# Patient Record
Sex: Female | Born: 1985 | Race: White | Hispanic: No | Marital: Married | Smoking: Former smoker
Health system: Southern US, Community
[De-identification: ages and names within clinical notes are randomized; demographics above are authoritative.]

## PROBLEM LIST (undated history)

## (undated) DIAGNOSIS — F419 Anxiety disorder, unspecified: Secondary | ICD-10-CM

## (undated) DIAGNOSIS — E669 Obesity, unspecified: Secondary | ICD-10-CM

## (undated) DIAGNOSIS — J358 Other chronic diseases of tonsils and adenoids: Secondary | ICD-10-CM

## (undated) DIAGNOSIS — D239 Other benign neoplasm of skin, unspecified: Secondary | ICD-10-CM

## (undated) DIAGNOSIS — Z8489 Family history of other specified conditions: Secondary | ICD-10-CM

## (undated) DIAGNOSIS — B019 Varicella without complication: Secondary | ICD-10-CM

## (undated) DIAGNOSIS — N736 Female pelvic peritoneal adhesions (postinfective): Secondary | ICD-10-CM

## (undated) DIAGNOSIS — F32A Depression, unspecified: Secondary | ICD-10-CM

## (undated) DIAGNOSIS — B009 Herpesviral infection, unspecified: Secondary | ICD-10-CM

## (undated) DIAGNOSIS — T4145XA Adverse effect of unspecified anesthetic, initial encounter: Secondary | ICD-10-CM

## (undated) DIAGNOSIS — B029 Zoster without complications: Secondary | ICD-10-CM

## (undated) DIAGNOSIS — N83202 Unspecified ovarian cyst, left side: Secondary | ICD-10-CM

## (undated) DIAGNOSIS — T8859XA Other complications of anesthesia, initial encounter: Secondary | ICD-10-CM

## (undated) DIAGNOSIS — J020 Streptococcal pharyngitis: Secondary | ICD-10-CM

## (undated) DIAGNOSIS — R102 Pelvic and perineal pain: Secondary | ICD-10-CM

## (undated) HISTORY — DX: Streptococcal pharyngitis: J02.0

## (undated) HISTORY — DX: Varicella without complication: B01.9

## (undated) HISTORY — DX: Depression, unspecified: F32.A

## (undated) HISTORY — PX: WISDOM TOOTH EXTRACTION: SHX21

## (undated) HISTORY — DX: Other benign neoplasm of skin, unspecified: D23.9

## (undated) HISTORY — PX: ABDOMINAL HYSTERECTOMY: SHX81

## (undated) HISTORY — PX: COLONOSCOPY: SHX174

---

## 2011-05-29 HISTORY — PX: CHOLECYSTECTOMY: SHX55

## 2013-03-28 DIAGNOSIS — N83202 Unspecified ovarian cyst, left side: Secondary | ICD-10-CM

## 2013-03-28 HISTORY — DX: Unspecified ovarian cyst, left side: N83.202

## 2013-07-20 ENCOUNTER — Emergency Department
Admission: EM | Admit: 2013-07-20 | Discharge: 2013-07-20 | Disposition: A | Discharge status: Home / Self Care | Payer: BC Managed Care – PPO | Source: Home / Self Care | Attending: Family Medicine | Admitting: Family Medicine

## 2013-07-20 ENCOUNTER — Encounter: Payer: Self-pay | Admitting: Emergency Medicine

## 2013-07-20 DIAGNOSIS — H9209 Otalgia, unspecified ear: Secondary | ICD-10-CM

## 2013-07-20 DIAGNOSIS — J029 Acute pharyngitis, unspecified: Secondary | ICD-10-CM

## 2013-07-20 HISTORY — DX: Anxiety disorder, unspecified: F41.9

## 2013-07-20 LAB — POCT CBC W AUTO DIFF (K'VILLE URGENT CARE)

## 2013-07-20 LAB — POCT MONO SCREEN (KUC): Mono, POC: NEGATIVE

## 2013-07-20 LAB — POCT RAPID STREP A (OFFICE): Rapid Strep A Screen: NEGATIVE

## 2013-07-20 MED ORDER — PENICILLIN V POTASSIUM 500 MG PO TABS: ORAL_TABLET | ORAL | Status: DC

## 2013-07-20 MED ORDER — PREDNISONE 20 MG PO TABS: 20.0000 mg | ORAL_TABLET | Freq: Two times a day (BID) | ORAL | Status: DC

## 2013-07-20 NOTE — ED Notes (Signed)
Pt reports that she was seen at [facility] last wk with a positive strep test and was given Amoxicillin 875mg  BID. She reports that she is no better. Pt c/o sore throat, ear pain, stiff neck and fever.

## 2013-07-20 NOTE — ED Provider Notes (Signed)
CSN: [id number]     Arrival date & time 07/20/13  4332 History   First MD Initiated Contact with Patient 07/20/13 0915     Chief Complaint  Patient presents with  . Sore Throat  . Otalgia        HPI Comments: Patient visited an urgent care center last week with a sore throat.  Her rapid strep test was positive and she was treated with amoxicillin 875mg  BID.  She complains of persistent sore throat.  She has now developed myalgias, earache, fatigue, and low grade fever.  The history is provided by the patient.    Past Medical History  Diagnosis Date  . Anxiety    Past Surgical History  Procedure Laterality Date  . Cholecystectomy     Family History  Problem Relation Age of Onset  . Hypertension Mother    History  Substance Use Topics  . Smoking status: Former Research scientist (life sciences)  . Smokeless tobacco: Not on file  . Alcohol Use: No   OB History   Grav Para Term Preterm Abortions TAB SAB Ect Mult Living                 Review of Systems + sore throat No cough No pleuritic pain No wheezing No nasal congestion ? post-nasal drainage No sinus pain/pressure No itchy/red eyes ? earache No hemoptysis No SOB + fever, + chills No nausea No vomiting No abdominal pain No diarrhea No urinary symptoms No skin rash + fatigue + myalgias + headache Used OTC meds without relief    Allergies  Review of patient's allergies indicates no known allergies.  Home Medications   Current Outpatient Rx  Name  Route  Sig  Dispense  Refill  . amoxicillin (AMOXIL) 875 MG tablet   Oral   Take 875 mg by mouth 2 (two) times daily.         Marland Kitchen escitalopram (LEXAPRO) 10 MG tablet   Oral   Take 10 mg by mouth daily.         . medroxyPROGESTERone (DEPO-PROVERA) 150 MG/ML injection   Intramuscular   Inject 150 mg into the muscle every 3 (three) months.         . penicillin v potassium (VEETID) 500 MG tablet      Take one tab by mouth twice daily   14 tablet   0   . predniSONE  (DELTASONE) 20 MG tablet   Oral   Take 1 tablet (20 mg total) by mouth 2 (two) times daily. Take with food.   10 tablet   0    BP 100/67  Pulse 67  Temp(Src) 98 F (36.7 C) (Oral)  Resp 18  Ht 5\' 11"  (1.803 m)  Wt 191 lb (86.637 kg)  BMI 26.65 kg/m2  SpO2 100%  LMP 06/19/2013 Physical Exam Nursing notes and Vital Signs reviewed. Appearance:  Patient appears healthy, stated age, and in no acute distress Eyes:  Pupils are equal, round, and reactive to light and accomodation.  Extraocular movement is intact.  Conjunctivae are not inflamed  Ears:  Canals normal.  Tympanic membranes normal.  Nose:  Mildly congested turbinates.  No sinus tenderness.   Pharynx:  Mildly erythematous Neck:  Supple.  Tender slightly enlarged anterior nodes; large tender posterior nodes Lungs:  Clear to auscultation.  Breath sounds are equal.  Heart:  Regular rate and rhythm without murmurs, rubs, or gallops.  Abdomen: Mild tenderness over spleen without masses or hepatosplenomegaly.  Bowel sounds are present.  No  CVA or flank tenderness.  Extremities:  Normal Skin:  No rash present.   ED Course  Procedures  none    Labs Reviewed  POCT RAPID STREP A (OFFICE) negative  POCT MONO SCREEN ([facility]) negative  POCT CBC W AUTO DIFF ([facility])  WBC 7.6; LY 22.3; MO 6.6; GR 71.1; Hgb 13.1; Platelets 203         MDM   Final diagnoses:  Acute pharyngitis; ?mono despite negative rapid test    Throat culture.  Switch to penicillin EBV titers pending.  Prednisone burst. May take Tylenol for pain.  Try warm salt water gargles. Followup with Family Doctor if not improved in one week.     Kandra Nicolas, MD 07/23/13 [phone]

## 2013-07-20 NOTE — Discharge Instructions (Signed)
May take Tylenol for pain.  Try warm salt water gargles.   Salt Water Gargle This solution will help make your mouth and throat feel better. HOME CARE INSTRUCTIONS   Mix 1 teaspoon of salt in 8 ounces of warm water.  Gargle with this solution as much or often as you need or as directed. Swish and gargle gently if you have any sores or wounds in your mouth.  Do not swallow this mixture. Document Released: 02/16/2004 Document Revised: 08/06/2011 Document Reviewed: 07/09/2008 [facility] Patient Information 2014 [place].

## 2013-07-21 LAB — STREP A DNA PROBE: GASP: NEGATIVE

## 2013-07-26 ENCOUNTER — Telehealth: Payer: Self-pay

## 2013-07-26 NOTE — ED Notes (Signed)
Left a message on voice mail asking how patient is feeling and advising to call back with any questions or concerns. Also advised culture was negative.

## 2013-07-27 ENCOUNTER — Ambulatory Visit (INDEPENDENT_AMBULATORY_CARE_PROVIDER_SITE_OTHER): Payer: BC Managed Care – PPO | Admitting: Physician Assistant

## 2013-07-27 ENCOUNTER — Encounter: Payer: Self-pay | Admitting: Physician Assistant

## 2013-07-27 VITALS — BP 97/63 | HR 75 | Temp 98.3°F | Resp 16 | Ht 71.0 in | Wt 193.2 lb

## 2013-07-27 DIAGNOSIS — J020 Streptococcal pharyngitis: Secondary | ICD-10-CM

## 2013-07-27 DIAGNOSIS — F411 Generalized anxiety disorder: Secondary | ICD-10-CM

## 2013-07-27 DIAGNOSIS — Z7189 Other specified counseling: Secondary | ICD-10-CM

## 2013-07-27 DIAGNOSIS — Z7689 Persons encountering health services in other specified circumstances: Secondary | ICD-10-CM

## 2013-07-27 DIAGNOSIS — J0301 Acute recurrent streptococcal tonsillitis: Secondary | ICD-10-CM

## 2013-07-27 DIAGNOSIS — F419 Anxiety disorder, unspecified: Secondary | ICD-10-CM

## 2013-07-27 DIAGNOSIS — R87619 Unspecified abnormal cytological findings in specimens from cervix uteri: Secondary | ICD-10-CM | POA: Insufficient documentation

## 2013-07-27 HISTORY — DX: Unspecified abnormal cytological findings in specimens from cervix uteri: R87.619

## 2013-07-27 MED ORDER — AZITHROMYCIN 250 MG PO TABS: ORAL_TABLET | ORAL | Status: DC

## 2013-07-27 MED ORDER — CEFTRIAXONE SODIUM 500 MG IJ SOLR
250.0000 mg | Freq: Once | INTRAMUSCULAR | Status: AC
  Administered 2013-07-27: 250 mg via INTRAMUSCULAR

## 2013-07-27 NOTE — Assessment & Plan Note (Signed)
Endorses rapid strep positive at [facility].  Monospot and Strep Culture obtained at [facility] are negative.  Patient with tonsillar adenopathy and exudate.  Patient given IM Rocephin injection.  Rx Azithromycin.  Urgent referral placed to ENT for recurrent symptoms and to further r/o peritonsillar abscess.

## 2013-07-27 NOTE — Assessment & Plan Note (Signed)
Well-controlled with Lexapro 10 mg.  Denies SI/HI.  Denies breakthrough anxiety.  Continue current regimen.  Follow-up in 6 months.  Will obtain labs from previous PCP.

## 2013-07-27 NOTE — Progress Notes (Signed)
Patient presents to clinic today to establish care.  Acute Concerns: Persistent pharyngitis and tonsillitis -- Patient seen at [facility] outside of our system ~ 2 weeks ago for sore throat.  Patient was diagnosed with strep throat and given Rx for amoxicillin.  Patient states her symptoms worsened while on amoxicillin and therefore she went to the [facility] for evaluation and treatment.  Patient with clinical signs of strep throat.  Patient was placed of Penicillin and given a Rx for prednisone. Patient has completed course of antibiotic and steroid with persistence of symptoms.  Of note, patient has Monospot and Strep Culture performed, both of which are negative. Patient complains of continued sore throat with some mild, painful swallowing.  States her pain is now mostly left-sided.  Denied fever, chills, rash, difficulty swallowing, difficulty talking or excess drooling.  Patient is sexually active currently with 1 partner, her husband.  Does engage in fellatio.  Denies concern for STI.  Chronic Issues: Anxiety -- well-controlled with Lexapro 10 mg daily.  Denies depressed mood, anhedonia, SI/HI, panic attacks.  Does not need refill of medication at today's visit.  Health Maintenance: Dental -- UTD Vision -- UTD Immunizations -- Declines flu shot; Tetanus unknown likely over 10 years ago. PAP -- last in 2/15; + hx of abnormal pap smear; Followed by Dr. Kris Mouton; next HPV in 1 year.  Past Medical History  Diagnosis Date  . Anxiety   . Strep throat   . Chicken pox     Past Surgical History  Procedure Laterality Date  . Cholecystectomy  2013  . Wisdom tooth extraction      Current Outpatient Prescriptions on File Prior to Visit  Medication Sig Dispense Refill  . escitalopram (LEXAPRO) 10 MG tablet Take 10 mg by mouth daily.      . medroxyPROGESTERone (DEPO-PROVERA) 150 MG/ML injection Inject 150 mg into the muscle every 3 (three) months.      . penicillin v potassium  (VEETID) 500 MG tablet Take one tab by mouth twice daily  14 tablet  0   No current facility-administered medications on file prior to visit.    No Known Allergies  Family History  Problem Relation Age of Onset  . Hypertension Mother   . Arthritis Maternal Grandmother   . Hyperlipidemia Maternal Grandmother   . Hypertension Maternal Grandmother   . Throat cancer Maternal Grandfather   . Stroke Maternal Aunt   . Healthy Sister     x1  . Healthy Daughter     x1    History   Social History  . Marital Status: Single    Spouse Name: N/A    Number of Children: N/A  . Years of Education: N/A   Occupational History  . Not on file.   Social History Main Topics  . Smoking status: Former Smoker    Quit date: 07/27/2012  . Smokeless tobacco: Never Used  . Alcohol Use: No  . Drug Use: No  . Sexual Activity: Yes    Birth Control/ Protection: None     Comment: men   Other Topics Concern  . Not on file   Social History Narrative  . No narrative on file    Review of Systems  Constitutional: Negative for fever and weight loss.  HENT: Positive for sore throat. Negative for ear pain, hearing loss and tinnitus.   Eyes: Negative for blurred vision and double vision.  Respiratory: Negative for cough, shortness of breath and wheezing.   Cardiovascular: Negative for  chest pain and palpitations.  Gastrointestinal: Positive for constipation. Negative for heartburn, nausea, vomiting, abdominal pain, diarrhea, blood in stool and melena.  Genitourinary: Negative for dysuria, urgency, frequency, hematuria and flank pain.  Neurological: Negative for dizziness, seizures, loss of consciousness and headaches.  Endo/Heme/Allergies: Negative for environmental allergies.  Psychiatric/Behavioral: Negative for depression, suicidal ideas, hallucinations and substance abuse. The patient is nervous/anxious. The patient does not have insomnia.    BP 97/63  Pulse 75  Temp(Src) 98.3 F (36.8 C)  (Oral)  Resp 16  Ht 5\' 11"  (1.803 m)  Wt 193 lb 4 oz (87.658 kg)  BMI 26.96 kg/m2  SpO2 99%  LMP 06/19/2013  Physical Exam  Nursing note and vitals reviewed. Constitutional: She is oriented to person, place, and time and well-developed, well-nourished, and in no distress.  HENT:  Head: Normocephalic and atraumatic.  Right Ear: External ear normal.  Left Ear: External ear normal.  Mouth/Throat: Uvula is midline and mucous membranes are normal. No uvula swelling. Oropharyngeal exudate present. No posterior oropharyngeal edema or posterior oropharyngeal erythema.  No overt evidence of peritonsillar abscess.  L tonsil 2+ with white exudate. R tonsil 2+ without exudate.  Eyes: Conjunctivae are normal. Pupils are equal, round, and reactive to light.  Neck: Neck supple. No tracheal deviation present.  Cardiovascular: Normal rate, regular rhythm and normal heart sounds.   Pulmonary/Chest: Effort normal and breath sounds normal. No stridor. No respiratory distress. She has no wheezes. She has no rales. She exhibits no tenderness.  Lymphadenopathy:    She has no cervical adenopathy.  Neurological: She is alert and oriented to person, place, and time.  Skin: Skin is warm and dry. No rash noted.  Psychiatric: Affect normal.   Recent Results (from the past 2160 hour(s))  POCT RAPID STREP A (OFFICE)     Status: None   Collection Time    07/20/13  9:14 AM      Result Value Ref Range   Rapid Strep A Screen Negative  Negative  POCT MONO SCREEN ([facility])     Status: None   Collection Time    07/20/13  9:14 AM      Result Value Ref Range   Mono, POC Negative  Negative  POCT CBC W AUTO DIFF ([facility])     Status: None   Collection Time    07/20/13  9:14 AM      Result Value Ref Range   WBC    4.5 - 10.5 K/uL   Comment: see scanned report   Lymphocytes relative %    15 - 45 %   Monocytes relative %    2 - 10 %   Neutrophils relative % (GR)    44 - 76 %   Lymphocytes absolute    0.1 -  1.8 K/uL   Monocyes absolute    0.1 - 1 K/uL   Neutrophils absolute (GR#)    1.7 - 7.8 K/uL   RBC    3.8 - 5.1 MIL/uL   Hemoglobin    11.8 - 15.5 g/dL   Hematocrit    34.8 - 46 %   MCV    78 - 100 fL   MCH    26 - 32 pg   MCHC    32 - 36.5 g/dL   RDW    11.6 - 14 %   Platelet count    140 - 400 K/uL   MPV    7.8 - 11 fL  STREP A DNA  PROBE     Status: None   Collection Time    07/20/13  9:25 AM      Result Value Ref Range   GASP NEGATIVE     Assessment/Plan: Anxiety Well-controlled with Lexapro 10 mg.  Denies SI/HI.  Denies breakthrough anxiety.  Continue current regimen.  Follow-up in 6 months.  Will obtain labs from previous PCP.  Encounter to establish care Medical history reviewed.  Patient UTD on health maintenance.  Patient endorses having physical with her PCP in [place].  Will obtain records.  Patient declines flu shot. Is overdue for tetanus but declines vaccination at today's visit.  Patient followed by Dr. Kris Mouton (OB/GYN) for gynecological care.  Endorses last pap was in 2014; was abnormal.  Has repeat PAP in 1 year.  Recurrent streptococcal tonsillitis Endorses rapid strep positive at [facility].  Monospot and Strep Culture obtained at [facility] are negative.  Patient with tonsillar adenopathy and exudate.  Patient given IM Rocephin injection.  Rx Azithromycin.  Urgent referral placed to ENT for recurrent symptoms and to further r/o peritonsillar abscess.

## 2013-07-27 NOTE — Patient Instructions (Signed)
Please stop Penicillin.  You will be contacted by ENT office for an appointment.  If appointment is in the next 1-2 days, hold off on taking Azithromycin as the ENT may want to change your medication regimen.  However if it is more than 2 days before appointment, please start taking the Azithromycin as directed.  Alternate tylenol and ibuprofen for pain.  Salt-water gargles. If you develop difficulty swallowing, speaking or cannot control your saliva, please proceed directly to the ER.  Please follow-up in 6 months for anxiety.  I will obtain your records from your previous PCP.  Follow-up with Gynecology for PAP smears and routine gynecological care.  Return to clinic sooner if you need anything.

## 2013-07-27 NOTE — Progress Notes (Signed)
Pre visit review using our clinic review tool, if applicable. No additional management support is needed unless otherwise documented below in the visit note/SLS  

## 2013-07-27 NOTE — Assessment & Plan Note (Signed)
Medical history reviewed.  Patient UTD on health maintenance.  Patient endorses having physical with her PCP in [place].  Will obtain records.  Patient declines flu shot. Is overdue for tetanus but declines vaccination at today's visit.  Patient followed by Dr. Kris Mouton (OB/GYN) for gynecological care.  Endorses last pap was in 2014; was abnormal.  Has repeat PAP in 1 year.

## 2014-01-07 ENCOUNTER — Encounter: Payer: Self-pay | Admitting: Physician Assistant

## 2014-01-07 ENCOUNTER — Ambulatory Visit (INDEPENDENT_AMBULATORY_CARE_PROVIDER_SITE_OTHER): Payer: BC Managed Care – PPO | Admitting: Physician Assistant

## 2014-01-07 VITALS — BP 108/74 | HR 61 | Temp 98.4°F | Resp 16 | Ht 71.0 in | Wt 201.5 lb

## 2014-01-07 DIAGNOSIS — M5431 Sciatica, right side: Secondary | ICD-10-CM

## 2014-01-07 DIAGNOSIS — K5900 Constipation, unspecified: Secondary | ICD-10-CM | POA: Insufficient documentation

## 2014-01-07 DIAGNOSIS — M5430 Sciatica, unspecified side: Secondary | ICD-10-CM | POA: Insufficient documentation

## 2014-01-07 MED ORDER — METHYLPREDNISOLONE (PAK) 4 MG PO TABS: ORAL_TABLET | ORAL | Status: DC

## 2014-01-07 MED ORDER — TRAMADOL HCL 50 MG PO TABS
50.0000 mg | ORAL_TABLET | Freq: Three times a day (TID) | ORAL | Status: DC | PRN
Start: 2014-01-07 — End: 2014-04-21

## 2014-01-07 NOTE — Progress Notes (Signed)
Pre visit review using our clinic review tool, if applicable. No additional management support is needed unless otherwise documented below in the visit note/SLS  

## 2014-01-07 NOTE — Assessment & Plan Note (Signed)
Rx medrol dose pack and Tramadol.  Topical Aspercreme.  Avoid overexertion. Heating pad.  Return precautions discussed.

## 2014-01-07 NOTE — Assessment & Plan Note (Signed)
Increase fluid intake.  Probiotic and fiber supplement.  Milk of Magnesia or warm prune juice if no bowel movement in 2 days. Discussed Rx therapy but patient declines at present.

## 2014-01-07 NOTE — Progress Notes (Signed)
Patient presents to clinic today c/o right sided hip and LBP radiating down her RLE.  Patient denies trauma or injury.  States that she has had some hip trouble since having her daughter several years ago.  Denies weakness or RLE, but does endorse some numbness at lateral aspect of R hip.  Denies saddle anesthesia but denies urinary/fecal incontinence.   Patient does wish to talk about her chronic constipation.  Endorses 1 bowel movement about every 3-4 days.  Does not take a fiber supplement.  Has tried Miralax before with little relief of symptoms. Denies hard stool with bowel movement.  Denies tenesmus, melena or hematochezia.  Past Medical History  Diagnosis Date  . Anxiety   . Strep throat   . Chicken pox     Current Outpatient Prescriptions on File Prior to Visit  Medication Sig Dispense Refill  . Calcium Carb-Cholecalciferol (CALCIUM-VITAMIN D) 600-400 MG-UNIT TABS Take 1 each by mouth daily.      Marland Kitchen escitalopram (LEXAPRO) 10 MG tablet Take 10 mg by mouth daily.      Marland Kitchen ibuprofen (ADVIL,MOTRIN) 200 MG tablet Take 200 mg by mouth every 6 (six) hours as needed.      . medroxyPROGESTERone (DEPO-PROVERA) 150 MG/ML injection Inject 150 mg into the muscle every 3 (three) months.      . Multiple Vitamin (MULTIVITAMIN) tablet Take 1 tablet by mouth daily.       No current facility-administered medications on file prior to visit.    No Known Allergies  Family History  Problem Relation Age of Onset  . Hypertension Mother   . Arthritis Maternal Grandmother   . Hyperlipidemia Maternal Grandmother   . Hypertension Maternal Grandmother   . Throat cancer Maternal Grandfather   . Stroke Maternal Aunt   . Healthy Sister     x1  . Healthy Daughter     x1    History   Social History  . Marital Status: Single    Spouse Name: N/A    Number of Children: N/A  . Years of Education: N/A   Social History Main Topics  . Smoking status: Former Smoker    Quit date: 07/27/2012  . Smokeless  tobacco: Never Used  . Alcohol Use: No  . Drug Use: No  . Sexual Activity: Yes    Birth Control/ Protection: None     Comment: men   Other Topics Concern  . None   Social History Narrative  . None   Review of Systems - See HPI.  All other ROS are negative.  BP 108/74  Pulse 61  Temp(Src) 98.4 F (36.9 C) (Oral)  Resp 16  Ht 5\' 11"  (1.803 m)  Wt 201 lb 8 oz (91.4 kg)  BMI 28.12 kg/m2  SpO2 99%  Physical Exam  Vitals reviewed. Constitutional: She is oriented to person, place, and time.  Cardiovascular: Normal rate, regular rhythm, normal heart sounds and intact distal pulses.   Pulmonary/Chest: Effort normal and breath sounds normal. No respiratory distress. She has no wheezes. She has no rales. She exhibits no tenderness.  Abdominal: Soft. Bowel sounds are normal. She exhibits no distension. There is no tenderness.  Musculoskeletal:       Right hip: She exhibits decreased strength. She exhibits normal range of motion, no tenderness and no bony tenderness.       Lumbar back: She exhibits no tenderness and no bony tenderness.  Neurological: She is alert and oriented to person, place, and time.  Skin: Skin is warm  and dry. No erythema.   Assessment/Plan: Sciatica Rx medrol dose pack and Tramadol.  Topical Aspercreme.  Avoid overexertion. Heating pad.  Return precautions discussed.  Unspecified constipation Increase fluid intake.  Probiotic and fiber supplement.  Milk of Magnesia or warm prune juice if no bowel movement in 2 days. Discussed Rx therapy but patient declines at present.

## 2014-01-07 NOTE — Patient Instructions (Signed)
Please take medications as directed.  Avoid heavy lifting or overexertion.  Apply topical Aspercreme to area each day.  Rest. Call or return to clinic if symptoms are not improving.  For constipation -- increase fluid intake.  Take a fiber supplement and a daily probiotic (Activia, Align. Culturelle).  If no bowel movement in 2 days, take a dose of Milk of Magnesia or a glass of warm prune juice. Please let me know if symptoms are not improving.  Constipation Constipation is when a person has fewer than three bowel movements a week, has difficulty having a bowel movement, or has stools that are dry, hard, or larger than normal. As people grow older, constipation is more common. If you try to fix constipation with medicines that make you have a bowel movement (laxatives), the problem may get worse. Long-term laxative use may cause the muscles of the colon to become weak. A low-fiber diet, not taking in enough fluids, and taking certain medicines may make constipation worse.  CAUSES   Certain medicines, such as antidepressants, pain medicine, iron supplements, antacids, and water pills.   Certain diseases, such as diabetes, irritable bowel syndrome (IBS), thyroid disease, or depression.   Not drinking enough water.   Not eating enough fiber-rich foods.   Stress or travel.   Lack of physical activity or exercise.   Ignoring the urge to have a bowel movement.   Using laxatives too much.  SIGNS AND SYMPTOMS   Having fewer than three bowel movements a week.   Straining to have a bowel movement.   Having stools that are hard, dry, or larger than normal.   Feeling full or bloated.   Pain in the lower abdomen.   Not feeling relief after having a bowel movement.  DIAGNOSIS  Your health care provider will take a medical history and perform a physical exam. Further testing may be done for severe constipation. Some tests may include:  A barium enema X-ray to examine your  rectum, colon, and, sometimes, your small intestine.   A sigmoidoscopy to examine your lower colon.   A colonoscopy to examine your entire colon. TREATMENT  Treatment will depend on the severity of your constipation and what is causing it. Some dietary treatments include drinking more fluids and eating more fiber-rich foods. Lifestyle treatments may include regular exercise. If these diet and lifestyle recommendations do not help, your health care provider may recommend taking over-the-counter laxative medicines to help you have bowel movements. Prescription medicines may be prescribed if over-the-counter medicines do not work.  HOME CARE INSTRUCTIONS   Eat foods that have a lot of fiber, such as fruits, vegetables, whole grains, and beans.  Limit foods high in fat and processed sugars, such as french fries, hamburgers, cookies, candies, and soda.   A fiber supplement may be added to your diet if you cannot get enough fiber from foods.   Drink enough fluids to keep your urine clear or pale yellow.   Exercise regularly or as directed by your health care provider.   Go to the restroom when you have the urge to go. Do not hold it.   Only take over-the-counter or prescription medicines as directed by your health care provider. Do not take other medicines for constipation without talking to your health care provider first.  [place] IF:   You have bright red blood in your stool.   Your constipation lasts for more than 4 days or gets worse.   You have abdominal  or rectal pain.   You have thin, pencil-like stools.   You have unexplained weight loss. MAKE SURE YOU:   Understand these instructions.  Will watch your condition.  Will get help right away if you are not doing well or get worse. Document Released: 02/10/2004 Document Revised: 05/19/2013 Document Reviewed: 02/23/2013 [facility] Patient Information 2015 [place], [place]. This information is not  intended to replace advice given to you by your health care provider. Make sure you discuss any questions you have with your health care provider.

## 2014-02-26 ENCOUNTER — Ambulatory Visit: Payer: BC Managed Care – PPO | Admitting: Family

## 2014-04-14 ENCOUNTER — Telehealth: Payer: Self-pay | Admitting: *Deleted

## 2014-04-14 ENCOUNTER — Ambulatory Visit: Payer: BC Managed Care – PPO | Admitting: Physician Assistant

## 2014-04-14 NOTE — Telephone Encounter (Signed)
Pt did not show for appointment 04/14/2014 at 5:30pm for med refill

## 2014-04-21 ENCOUNTER — Ambulatory Visit (INDEPENDENT_AMBULATORY_CARE_PROVIDER_SITE_OTHER): Payer: BC Managed Care – PPO | Admitting: Physician Assistant

## 2014-04-21 ENCOUNTER — Encounter: Payer: Self-pay | Admitting: Physician Assistant

## 2014-04-21 VITALS — BP 107/52 | HR 70 | Temp 98.5°F | Resp 16 | Ht 71.0 in | Wt 205.0 lb

## 2014-04-21 DIAGNOSIS — F419 Anxiety disorder, unspecified: Secondary | ICD-10-CM

## 2014-04-21 MED ORDER — ESCITALOPRAM OXALATE 10 MG PO TABS: 10.0000 mg | ORAL_TABLET | Freq: Every day | ORAL | Status: DC

## 2014-04-21 NOTE — Assessment & Plan Note (Signed)
Doing well.  Will refill Lexapro 10 mg to continue taking daily.  Follow-up in 6 months.

## 2014-04-21 NOTE — Progress Notes (Signed)
   Patient presents to clinic today for medication management. Patient on Lexapro for generalized anxiety disorder.  Prescribed by psychiatry previously. Patient's symptoms have been well controlled on this medication for several years.  Denies breakthrough anxiety or panic attack as long as she is consistent with use of medication.  Denies SI/HI.  Past Medical History  Diagnosis Date  . Anxiety   . Strep throat   . Chicken pox     Current Outpatient Prescriptions on File Prior to Visit  Medication Sig Dispense Refill  . Calcium Carb-Cholecalciferol (CALCIUM-VITAMIN D) 600-400 MG-UNIT TABS Take 1 each by mouth daily.    Marland Kitchen ibuprofen (ADVIL,MOTRIN) 200 MG tablet Take 200 mg by mouth every 6 (six) hours as needed.    . medroxyPROGESTERone (DEPO-PROVERA) 150 MG/ML injection Inject 150 mg into the muscle every 3 (three) months.    . Multiple Vitamin (MULTIVITAMIN) tablet Take 1 tablet by mouth daily.     No current facility-administered medications on file prior to visit.    No Known Allergies  Family History  Problem Relation Age of Onset  . Hypertension Mother   . Arthritis Maternal Grandmother   . Hyperlipidemia Maternal Grandmother   . Hypertension Maternal Grandmother   . Throat cancer Maternal Grandfather   . Stroke Maternal Aunt   . Healthy Sister     x1  . Healthy Daughter     x1    History   Social History  . Marital Status: Single    Spouse Name: N/A    Number of Children: N/A  . Years of Education: N/A   Social History Main Topics  . Smoking status: Former Smoker    Quit date: 07/27/2012  . Smokeless tobacco: Never Used  . Alcohol Use: No  . Drug Use: No  . Sexual Activity: Yes    Birth Control/ Protection: None     Comment: men   Other Topics Concern  . None   Social History Narrative   Review of Systems - See HPI.  All other ROS are negative.  BP 107/52 mmHg  Pulse 70  Temp(Src) 98.5 F (36.9 C) (Oral)  Resp 16  Ht 5\' 11"  (1.803 m)  Wt 205 lb  (92.987 kg)  BMI 28.60 kg/m2  SpO2 100%  Physical Exam  Constitutional: She is oriented to person, place, and time and well-developed, well-nourished, and in no distress.  HENT:  Head: Normocephalic and atraumatic.  Cardiovascular: Normal rate, regular rhythm, normal heart sounds and intact distal pulses.   Pulmonary/Chest: Effort normal and breath sounds normal. No respiratory distress. She has no wheezes. She has no rales. She exhibits no tenderness.  Neurological: She is alert and oriented to person, place, and time.  Skin: Skin is warm and dry. No rash noted.  Psychiatric: Affect normal.  Vitals reviewed.  Assessment/Plan: Anxiety Doing well.  Will refill Lexapro 10 mg to continue taking daily.  Follow-up in 6 months.

## 2014-04-21 NOTE — Patient Instructions (Signed)
Please take Lexapro as directed.  Follow-up with me in 6 months.  Return sooner if needed.  I am glad you are doing well.  Have a happy Thanksgiving!  Generalized Anxiety Disorder Generalized anxiety disorder (GAD) is a mental disorder. It interferes with life functions, including relationships, work, and school. GAD is different from normal anxiety, which everyone experiences at some point in their lives in response to specific life events and activities. Normal anxiety actually helps [place] prepare for and get through these life events and activities. Normal anxiety goes away after the event or activity is over.  GAD causes anxiety that is not necessarily related to specific events or activities. It also causes excess anxiety in proportion to specific events or activities. The anxiety associated with GAD is also difficult to control. GAD can vary from mild to severe. People with severe GAD can have intense waves of anxiety with physical symptoms (panic attacks).  SYMPTOMS The anxiety and worry associated with GAD are difficult to control. This anxiety and worry are related to many life events and activities and also occur more days than not for 6 months or longer. People with GAD also have three or more of the following symptoms (one or more in children):  Restlessness.   Fatigue.  Difficulty concentrating.   Irritability.  Muscle tension.  Difficulty sleeping or unsatisfying sleep. DIAGNOSIS GAD is diagnosed through an assessment by your health care provider. Your health care provider will ask you questions aboutyour mood,physical symptoms, and events in your life. Your health care provider may ask you about your medical history and use of alcohol or drugs, including prescription medicines. Your health care provider may also do a physical exam and blood tests. Certain medical conditions and the use of certain substances can cause symptoms similar to those associated with GAD. Your health care  provider may refer you to a mental health specialist for further evaluation. TREATMENT The following therapies are usually used to treat GAD:   Medication. Antidepressant medication usually is prescribed for long-term daily control. Antianxiety medicines may be added in severe cases, especially when panic attacks occur.   Talk therapy (psychotherapy). Certain types of talk therapy can be helpful in treating GAD by providing support, education, and guidance. A form of talk therapy called cognitive behavioral therapy can teach you healthy ways to think about and react to daily life events and activities.  Stress managementtechniques. These include yoga, meditation, and exercise and can be very helpful when they are practiced regularly. A mental health specialist can help determine which treatment is best for you. Some people see improvement with one therapy. However, other people require a combination of therapies. Document Released: 09/08/2012 Document Revised: 09/28/2013 Document Reviewed: 09/08/2012 [facility] Patient Information 2015 [place], [place]. This information is not intended to replace advice given to you by your health care provider. Make sure you discuss any questions you have with your health care provider.

## 2014-05-04 ENCOUNTER — Ambulatory Visit (INDEPENDENT_AMBULATORY_CARE_PROVIDER_SITE_OTHER): Payer: BC Managed Care – PPO | Admitting: Family Medicine

## 2014-05-04 ENCOUNTER — Encounter: Payer: Self-pay | Admitting: Family Medicine

## 2014-05-04 VITALS — BP 120/81 | HR 80 | Temp 98.9°F | Wt 208.0 lb

## 2014-05-04 DIAGNOSIS — T148 Other injury of unspecified body region: Secondary | ICD-10-CM

## 2014-05-04 DIAGNOSIS — W57XXXA Bitten or stung by nonvenomous insect and other nonvenomous arthropods, initial encounter: Secondary | ICD-10-CM

## 2014-05-04 MED ORDER — DOXYCYCLINE HYCLATE 100 MG PO TABS
100.0000 mg | ORAL_TABLET | Freq: Two times a day (BID) | ORAL | Status: DC
Start: 2014-05-04 — End: 2014-05-12

## 2014-05-04 NOTE — Progress Notes (Signed)
Pre visit review using our clinic review tool, if applicable. No additional management support is needed unless otherwise documented below in the visit note. 

## 2014-05-04 NOTE — Patient Instructions (Signed)

## 2014-05-04 NOTE — Progress Notes (Signed)
   Subjective:    Patient ID: Olivia Reyes, female    DOB: Jun 02, 1985, 28 y.o.   MRN: [id number]  HPI Pt here c/o possible insect bite on L buttock--painful to touch only.  No fevers, arthralgias, chills, no nvd,   pt noticed it 2 days ago and it inc in size and became painful.    Review of Systems As above    Objective:   Physical Exam  BP 120/81 mmHg  Pulse 80  Temp(Src) 98.9 F (37.2 C) (Oral)  Wt 208 lb (94.348 kg)  SpO2 100% General appearance: alert, cooperative, appears stated age and no distress Skin: papular - buttock(s) left          + errythema and draining scant amount clear fluid      Assessment & Plan:  1. Insect bite Culture done Call in 3-4 days if no better - doxycycline (VIBRA-TABS) 100 MG tablet; Take 1 tablet (100 mg total) by mouth 2 (two) times daily.  Dispense: 20 tablet; Refill: 0 - Wound culture

## 2014-05-08 LAB — WOUND CULTURE
GRAM STAIN: NONE SEEN
Gram Stain: NONE SEEN
Organism ID, Bacteria: NO GROWTH

## 2014-05-12 ENCOUNTER — Encounter: Payer: Self-pay | Admitting: Physician Assistant

## 2014-05-12 ENCOUNTER — Ambulatory Visit (INDEPENDENT_AMBULATORY_CARE_PROVIDER_SITE_OTHER): Payer: BC Managed Care – PPO | Admitting: Physician Assistant

## 2014-05-12 VITALS — BP 112/56 | HR 64 | Temp 98.7°F | Resp 16 | Ht 71.0 in | Wt 211.4 lb

## 2014-05-12 DIAGNOSIS — B029 Zoster without complications: Secondary | ICD-10-CM | POA: Insufficient documentation

## 2014-05-12 MED ORDER — TRAMADOL HCL 50 MG PO TABS: 50.0000 mg | ORAL_TABLET | Freq: Three times a day (TID) | ORAL | Status: DC | PRN

## 2014-05-12 MED ORDER — LIDOCAINE 5 % EX OINT: 1.0000 "application " | TOPICAL_OINTMENT | CUTANEOUS | Status: DC | PRN

## 2014-05-12 MED ORDER — VALACYCLOVIR HCL 1 G PO TABS
1000.0000 mg | ORAL_TABLET | Freq: Three times a day (TID) | ORAL | Status: DC
Start: 2014-05-12 — End: 2015-04-20

## 2014-05-12 NOTE — Progress Notes (Signed)
Pre visit review using our clinic review tool, if applicable. No additional management support is needed unless otherwise documented below in the visit note/SLS  

## 2014-05-12 NOTE — Assessment & Plan Note (Signed)
Rx Valtrex.  Rx Lidocaine ointment and Tramadol for severe pain.  Course of illness and contagious nature discussed with patient.  Follow-up discussed with patient.

## 2014-05-12 NOTE — Progress Notes (Signed)
Patient presents to clinic today c/o blistering rash of left hip that started out as a solitary lesion and just began to spread and blister over the past 2 days.  Endorses history of chicken pox, but denies history of shingles.  States rash is only on one side.  Was treated 1 week ago for potential insect bite with doxycycline.  Culture obtained and negative for any bacteria.   Past Medical History  Diagnosis Date  . Anxiety   . Strep throat   . Chicken pox     Current Outpatient Prescriptions on File Prior to Visit  Medication Sig Dispense Refill  . Calcium Carb-Cholecalciferol (CALCIUM-VITAMIN D) 600-400 MG-UNIT TABS Take 1 each by mouth daily.    Marland Kitchen escitalopram (LEXAPRO) 10 MG tablet Take 1 tablet (10 mg total) by mouth daily. 90 tablet 1  . ibuprofen (ADVIL,MOTRIN) 200 MG tablet Take 200 mg by mouth every 6 (six) hours as needed.    . medroxyPROGESTERone (DEPO-PROVERA) 150 MG/ML injection Inject 150 mg into the muscle every 3 (three) months.    . Multiple Vitamin (MULTIVITAMIN) tablet Take 1 tablet by mouth daily.     No current facility-administered medications on file prior to visit.    No Known Allergies  Family History  Problem Relation Age of Onset  . Hypertension Mother   . Arthritis Maternal Grandmother   . Hyperlipidemia Maternal Grandmother   . Hypertension Maternal Grandmother   . Throat cancer Maternal Grandfather   . Stroke Maternal Aunt   . Healthy Sister     x1  . Healthy Daughter     x1    History   Social History  . Marital Status: Single    Spouse Name: N/A    Number of Children: N/A  . Years of Education: N/A   Social History Main Topics  . Smoking status: Former Smoker    Quit date: 07/27/2012  . Smokeless tobacco: Never Used  . Alcohol Use: No  . Drug Use: No  . Sexual Activity: Yes    Birth Control/ Protection: None     Comment: men   Other Topics Concern  . None   Social History Narrative   Review of Systems. See HPI.  All other  ROS are negative.  BP 112/56 mmHg  Pulse 64  Temp(Src) 98.7 F (37.1 C) (Oral)  Resp 16  Ht 5\' 11"  (1.803 m)  Wt 211 lb 6 oz (95.879 kg)  BMI 29.49 kg/m2  SpO2 100%  Physical Exam  Constitutional: She is oriented to person, place, and time and well-developed, well-nourished, and in no distress.  HENT:  Head: Normocephalic and atraumatic.  Eyes: Conjunctivae are normal.  Neck: Neck supple.  Cardiovascular: Normal rate, regular rhythm, normal heart sounds and intact distal pulses.   Pulmonary/Chest: Effort normal and breath sounds normal. No respiratory distress. She has no wheezes. She has no rales. She exhibits no tenderness.  Neurological: She is alert and oriented to person, place, and time.  Skin: Skin is warm and dry.     Vitals reviewed.   Recent Results (from the past 2160 hour(s))  Wound culture     Status: None   Collection Time: 05/04/14  6:33 PM  Result Value Ref Range   Gram Stain Rare    Gram Stain WBC present-predominately PMN    Gram Stain No Squamous Epithelial Cells Seen    Gram Stain No Organisms Seen    Organism ID, Bacteria NO GROWTH 2 DAYS  Assessment/Plan: Shingles rash Rx Valtrex.  Rx Lidocaine ointment and Tramadol for severe pain.  Course of illness and contagious nature discussed with patient.  Follow-up discussed with patient.

## 2014-05-12 NOTE — Patient Instructions (Signed)
Please take Valtrex as directed.  For pain use the Tramadol and Lidocaine ointment given to you. Please keep rash covered as it is contagious while there are blisters.  Avoid contact with pregnant women.  Call or return to clinic if symptoms are not improving.  Shingles Shingles (herpes zoster) is an infection that is caused by the same virus that causes chickenpox (varicella). The infection causes a painful skin rash and fluid-filled blisters, which eventually break open, crust over, and heal. It may occur in any area of the body, but it usually affects only one side of the body or face. The pain of shingles usually lasts about 1 month. However, some people with shingles may develop long-term (chronic) pain in the affected area of the body. Shingles often occurs many years after the person had chickenpox. It is more common:  In people older than 50 years.  In people with weakened immune systems, such as those with HIV, AIDS, or cancer.  In people taking medicines that weaken the immune system, such as transplant medicines.  In people under great stress. CAUSES  Shingles is caused by the varicella zoster virus (VZV), which also causes chickenpox. After a person is infected with the virus, it can remain in the person's body for years in an inactive state (dormant). To cause shingles, the virus reactivates and breaks out as an infection in a nerve root. The virus can be spread from person to person (contagious) through contact with open blisters of the shingles rash. It will only spread to people who have not had chickenpox. When these people are exposed to the virus, they may develop chickenpox. They will not develop shingles. Once the blisters scab over, the person is no longer contagious and cannot spread the virus to others. SIGNS AND SYMPTOMS  Shingles shows up in stages. The initial symptoms may be pain, itching, and tingling in an area of the skin. This pain is usually described as burning,  stabbing, or throbbing.In a few days or weeks, a painful red rash will appear in the area where the pain, itching, and tingling were felt. The rash is usually on one side of the body in a band or belt-like pattern. Then, the rash usually turns into fluid-filled blisters. They will scab over and dry up in approximately 2-3 weeks. Flu-like symptoms may also occur with the initial symptoms, the rash, or the blisters. These may include:  Fever.  Chills.  Headache.  Upset stomach. DIAGNOSIS  Your health care provider will perform a skin exam to diagnose shingles. Skin scrapings or fluid samples may also be taken from the blisters. This sample will be examined under a microscope or sent to a lab for further testing. TREATMENT  There is no specific cure for shingles. Your health care provider will likely prescribe medicines to help you manage the pain, recover faster, and avoid long-term problems. This may include antiviral drugs, anti-inflammatory drugs, and pain medicines. HOME CARE INSTRUCTIONS   Take a cool bath or apply cool compresses to the area of the rash or blisters as directed. This may help with the pain and itching.   Take medicines only as directed by your health care provider.   Rest as directed by your health care provider.  Keep your rash and blisters clean with mild soap and cool water or as directed by your health care provider.  Do not pick your blisters or scratch your rash. Apply an anti-itch cream or numbing creams to the affected area as directed  by your health care provider.  Keep your shingles rash covered with a loose bandage (dressing).  Avoid skin contact with:  Babies.   Pregnant women.   Children with eczema.   Elderly people with transplants.   People with chronic illnesses, such as leukemia or AIDS.   Wear loose-fitting clothing to help ease the pain of material rubbing against the rash.  Keep all follow-up visits as directed by your health  care provider.If the area involved is on your face, you may receive a referral for a specialist, such as an eye doctor (ophthalmologist) or an ear, nose, and throat (ENT) doctor. Keeping all follow-up visits will help you avoid eye problems, chronic pain, or disability.  SEEK IMMEDIATE MEDICAL CARE IF:   You have facial pain, pain around the eye area, or loss of feeling on one side of your face.  You have ear pain or ringing in your ear.  You have loss of taste.  Your pain is not relieved with prescribed medicines.   Your redness or swelling spreads.   You have more pain and swelling.  Your condition is worsening or has changed.   You have a fever. MAKE SURE YOU:  Understand these instructions.  Will watch your condition.  Will get help right away if you are not doing well or get worse. Document Released: 05/14/2005 Document Revised: 09/28/2013 Document Reviewed: 12/27/2011 [facility] Patient Information 2015 [place], [place]. This information is not intended to replace advice given to you by your health care provider. Make sure you discuss any questions you have with your health care provider.

## 2014-07-23 LAB — HM PAP SMEAR: HM Pap smear: NORMAL

## 2015-04-19 ENCOUNTER — Ambulatory Visit: Payer: Self-pay | Admitting: Physician Assistant

## 2015-04-20 ENCOUNTER — Encounter: Payer: Self-pay | Admitting: Physician Assistant

## 2015-04-20 ENCOUNTER — Telehealth: Payer: Self-pay | Admitting: Physician Assistant

## 2015-04-20 ENCOUNTER — Ambulatory Visit (INDEPENDENT_AMBULATORY_CARE_PROVIDER_SITE_OTHER): Payer: BLUE CROSS/BLUE SHIELD | Admitting: Physician Assistant

## 2015-04-20 VITALS — BP 108/54 | HR 79 | Temp 98.0°F | Resp 16 | Ht 71.0 in | Wt 221.1 lb

## 2015-04-20 DIAGNOSIS — F419 Anxiety disorder, unspecified: Secondary | ICD-10-CM | POA: Diagnosis not present

## 2015-04-20 MED ORDER — ALPRAZOLAM 0.5 MG PO TBDP: 0.5000 mg | ORAL_TABLET | Freq: Two times a day (BID) | ORAL | Status: DC | PRN

## 2015-04-20 MED ORDER — ESCITALOPRAM OXALATE 20 MG PO TABS: 20.0000 mg | ORAL_TABLET | Freq: Every day | ORAL | Status: DC

## 2015-04-20 NOTE — Progress Notes (Signed)
   Patient presents to clinic today c/o sub-therapeutic medication for anxiety/depression. Endorses taking Lexapro daily as directed. (Last prescribed 1 year ago -6 month supply only). Endorses medication was doing well but over the past 2 months, due to recent stressors, the medication is not working as well. Denies SI/HI. Is sleeping well. Is feeling agitated and endorses anhedonia and depressed most days of the week.  Past Medical History  Diagnosis Date  . Anxiety   . Strep throat   . Chicken pox     Current Outpatient Prescriptions on File Prior to Visit  Medication Sig Dispense Refill  . Calcium Carb-Cholecalciferol (CALCIUM-VITAMIN D) 600-400 MG-UNIT TABS Take 1 each by mouth daily.    Marland Kitchen ibuprofen (ADVIL,MOTRIN) 200 MG tablet Take 200 mg by mouth every 6 (six) hours as needed.    . lidocaine (XYLOCAINE) 5 % ointment Apply 1 application topically as needed. 35.44 g 0  . medroxyPROGESTERone (DEPO-PROVERA) 150 MG/ML injection Inject 150 mg into the muscle every 3 (three) months.    . Multiple Vitamin (MULTIVITAMIN) tablet Take 1 tablet by mouth daily.     No current facility-administered medications on file prior to visit.    No Known Allergies  Family History  Problem Relation Age of Onset  . Hypertension Mother   . Arthritis Maternal Grandmother   . Hyperlipidemia Maternal Grandmother   . Hypertension Maternal Grandmother   . Throat cancer Maternal Grandfather   . Stroke Maternal Aunt   . Healthy Sister     x1  . Healthy Daughter     x1    Social History   Social History  . Marital Status: Single    Spouse Name: N/A  . Number of Children: N/A  . Years of Education: N/A   Social History Main Topics  . Smoking status: Former Smoker    Quit date: 07/27/2012  . Smokeless tobacco: Never Used  . Alcohol Use: No  . Drug Use: No  . Sexual Activity: Yes    Birth Control/ Protection: None     Comment: men   Other Topics Concern  . None   Social History Narrative     Review of Systems - See HPI.  All other ROS are negative.  BP 108/54 mmHg  Pulse 79  Temp(Src) 98 F (36.7 C) (Oral)  Resp 16  Ht 5\' 11"  (1.803 m)  Wt 221 lb 2 oz (100.302 kg)  BMI 30.85 kg/m2  SpO2 100%  Physical Exam  Constitutional: She is oriented to person, place, and time and well-developed, well-nourished, and in no distress.  HENT:  Head: Normocephalic and atraumatic.  Eyes: Conjunctivae are normal.  Cardiovascular: Normal rate, regular rhythm, normal heart sounds and intact distal pulses.   Pulmonary/Chest: Effort normal and breath sounds normal. No respiratory distress. She has no wheezes. She has no rales. She exhibits no tenderness.  Neurological: She is alert and oriented to person, place, and time.  Skin: Skin is warm and dry. No rash noted.  Psychiatric: Affect normal.  Vitals reviewed.  No results found for this or any previous visit (from the past 2160 hour(s)).  Assessment/Plan: Anxiety Will increase Lexapro to 20 mg daily. Will begin Xanax PRN for emergencies only. Follow-up 1 month.

## 2015-04-20 NOTE — Telephone Encounter (Signed)
Caller name: Shayla   Relationship to patient: Pharmacist    Can be [phone]  Pharmacy: [facility]   Reason for call: She called in to see if she could just give pt the regular tablet of Rx requesting instead of the OT. She says that they are out of the OT tablet requested. She says the Rx is for Xanax

## 2015-04-20 NOTE — Assessment & Plan Note (Signed)
Will increase Lexapro to 20 mg daily. Will begin Xanax PRN for emergencies only. Follow-up 1 month.

## 2015-04-20 NOTE — Patient Instructions (Signed)
Please start the new dose of your Lexapro. Take the Xanax as directed for significant acute anxiety only. Stay active as exercise is a stress relief and will help with anxiety.

## 2015-04-20 NOTE — Telephone Encounter (Signed)
Spoke with [facility] to ok change. Pharmacy will inform patient when Rx ready

## 2015-06-03 ENCOUNTER — Ambulatory Visit (INDEPENDENT_AMBULATORY_CARE_PROVIDER_SITE_OTHER): Payer: BLUE CROSS/BLUE SHIELD | Admitting: Physician Assistant

## 2015-06-03 ENCOUNTER — Encounter: Payer: Self-pay | Admitting: Physician Assistant

## 2015-06-03 VITALS — BP 116/68 | HR 53 | Temp 98.3°F | Resp 16 | Ht 71.0 in | Wt 223.5 lb

## 2015-06-03 DIAGNOSIS — J019 Acute sinusitis, unspecified: Secondary | ICD-10-CM | POA: Diagnosis not present

## 2015-06-03 DIAGNOSIS — B9689 Other specified bacterial agents as the cause of diseases classified elsewhere: Secondary | ICD-10-CM

## 2015-06-03 MED ORDER — BENZONATATE 200 MG PO CAPS: 200.0000 mg | ORAL_CAPSULE | Freq: Two times a day (BID) | ORAL | Status: DC | PRN

## 2015-06-03 MED ORDER — AMOXICILLIN-POT CLAVULANATE 875-125 MG PO TABS: 1.0000 | ORAL_TABLET | Freq: Two times a day (BID) | ORAL | Status: DC

## 2015-06-03 NOTE — Patient Instructions (Signed)
Please take antibiotic as directed.  Increase fluid intake.  Use Saline nasal spray.  Take a daily multivitamin. Use the Tessalon as directed.  Place a humidifier in the bedroom.  Please call or return clinic if symptoms are not improving.  Sinusitis Sinusitis is redness, soreness, and swelling (inflammation) of the paranasal sinuses. Paranasal sinuses are air pockets within the bones of your face (beneath the eyes, the middle of the forehead, or above the eyes). In healthy paranasal sinuses, mucus is able to drain out, and air is able to circulate through them by way of your nose. However, when your paranasal sinuses are inflamed, mucus and air can become trapped. This can allow bacteria and other germs to grow and cause infection. Sinusitis can develop quickly and last only a short time (acute) or continue over a long period (chronic). Sinusitis that lasts for more than 12 weeks is considered chronic.  CAUSES  Causes of sinusitis include:  Allergies.  Structural abnormalities, such as displacement of the cartilage that separates your nostrils (deviated septum), which can decrease the air flow through your nose and sinuses and affect sinus drainage.  Functional abnormalities, such as when the small hairs (cilia) that line your sinuses and help remove mucus do not work properly or are not present. SYMPTOMS  Symptoms of acute and chronic sinusitis are the same. The primary symptoms are pain and pressure around the affected sinuses. Other symptoms include:  Upper toothache.  Earache.  Headache.  Bad breath.  Decreased sense of smell and taste.  A cough, which worsens when you are lying flat.  Fatigue.  Fever.  Thick drainage from your nose, which often is green and may contain pus (purulent).  Swelling and warmth over the affected sinuses. DIAGNOSIS  Your caregiver will perform a physical exam. During the exam, your caregiver may:  Look in your nose for signs of abnormal growths  in your nostrils (nasal polyps).  Tap over the affected sinus to check for signs of infection.  View the inside of your sinuses (endoscopy) with a special imaging device with a light attached (endoscope), which is inserted into your sinuses. If your caregiver suspects that you have chronic sinusitis, one or more of the following tests may be recommended:  Allergy tests.  Nasal culture A sample of mucus is taken from your nose and sent to a lab and screened for bacteria.  Nasal cytology A sample of mucus is taken from your nose and examined by your caregiver to determine if your sinusitis is related to an allergy. TREATMENT  Most cases of acute sinusitis are related to a viral infection and will resolve on their own within 10 days. Sometimes medicines are prescribed to help relieve symptoms (pain medicine, decongestants, nasal steroid sprays, or saline sprays).  However, for sinusitis related to a bacterial infection, your caregiver will prescribe antibiotic medicines. These are medicines that will help kill the bacteria causing the infection.  Rarely, sinusitis is caused by a fungal infection. In theses cases, your caregiver will prescribe antifungal medicine. For some cases of chronic sinusitis, surgery is needed. Generally, these are cases in which sinusitis recurs more than 3 times per year, despite other treatments. HOME CARE INSTRUCTIONS   Drink plenty of water. Water helps thin the mucus so your sinuses can drain more easily.  Use a humidifier.  Inhale steam 3 to 4 times a day (for example, sit in the bathroom with the shower running).  Apply a warm, moist washcloth to your face 3  to 4 times a day, or as directed by your caregiver.  Use saline nasal sprays to help moisten and clean your sinuses.  Take over-the-counter or prescription medicines for pain, discomfort, or fever only as directed by your caregiver. SEEK IMMEDIATE MEDICAL CARE IF:  You have increasing pain or severe  headaches.  You have nausea, vomiting, or drowsiness.  You have swelling around your face.  You have vision problems.  You have a stiff neck.  You have difficulty breathing. MAKE SURE YOU:   Understand these instructions.  Will watch your condition.  Will get help right away if you are not doing well or get worse. Document Released: 05/14/2005 Document Revised: 08/06/2011 Document Reviewed: 05/29/2011 [facility] Patient Information 2014 [place], [place].

## 2015-06-03 NOTE — Assessment & Plan Note (Signed)
Rx Augmentin.  Increase fluids.  Rest.  Saline nasal spray.  Probiotic.  Mucinex as directed.  Humidifier in bedroom. Tessalon per orders.  Call or return to clinic if symptoms are not improving.

## 2015-06-03 NOTE — Progress Notes (Signed)
    Patient presents to clinic today c/o 2 weeks of sinus pressure/sinus pain, ear pain (left-sided) with chest congestion and a constant non-productive cough. Denies fever, chills, aches. Notes symptoms initially improved after 1st week but then took a turn for the worse. Denies sick contact. Has taken Mucinex and Sudafed with some relief in symptoms.  Past Medical History  Diagnosis Date  . Anxiety   . Strep throat   . Chicken pox     Current Outpatient Prescriptions on File Prior to Visit  Medication Sig Dispense Refill  . Calcium Carb-Cholecalciferol (CALCIUM-VITAMIN D) 600-400 MG-UNIT TABS Take 1 each by mouth daily.    Marland Kitchen escitalopram (LEXAPRO) 20 MG tablet Take 1 tablet (20 mg total) by mouth daily. 30 tablet 5  . medroxyPROGESTERone (DEPO-PROVERA) 150 MG/ML injection Inject 150 mg into the muscle every 3 (three) months.    . Multiple Vitamin (MULTIVITAMIN) tablet Take 1 tablet by mouth daily.     No current facility-administered medications on file prior to visit.    No Known Allergies  Family History  Problem Relation Age of Onset  . Hypertension Mother   . Arthritis Maternal Grandmother   . Hyperlipidemia Maternal Grandmother   . Hypertension Maternal Grandmother   . Throat cancer Maternal Grandfather   . Stroke Maternal Aunt   . Healthy Sister     x1  . Healthy Daughter     x1    Social History   Social History  . Marital Status: Single    Spouse Name: N/A  . Number of Children: N/A  . Years of Education: N/A   Social History Main Topics  . Smoking status: Former Smoker    Quit date: 07/27/2012  . Smokeless tobacco: Never Used  . Alcohol Use: No  . Drug Use: No  . Sexual Activity: Yes    Birth Control/ Protection: None     Comment: men   Other Topics Concern  . None   Social History Narrative   Review of Systems - See HPI.  All other ROS are negative.  BP 116/68 mmHg  Pulse 53  Temp(Src) 98.3 F (36.8 C) (Oral)  Resp 16  Ht 5\' 11"  (1.803 m)   Wt 223 lb 8 oz (101.379 kg)  BMI 31.19 kg/m2  SpO2 99%  Physical Exam  No results found for this or any previous visit (from the past 2160 hour(s)).  Assessment/Plan: Acute bacterial sinusitis Rx Augmentin.  Increase fluids.  Rest.  Saline nasal spray.  Probiotic.  Mucinex as directed.  Humidifier in bedroom. Tessalon per orders.  Call or return to clinic if symptoms are not improving.

## 2015-06-03 NOTE — Progress Notes (Signed)
Pre visit review using our clinic review tool, if applicable. No additional management support is needed unless otherwise documented below in the visit note/SLS  

## 2015-06-29 ENCOUNTER — Ambulatory Visit (INDEPENDENT_AMBULATORY_CARE_PROVIDER_SITE_OTHER): Payer: BLUE CROSS/BLUE SHIELD | Admitting: Physician Assistant

## 2015-06-29 ENCOUNTER — Encounter: Payer: Self-pay | Admitting: Physician Assistant

## 2015-06-29 VITALS — BP 113/61 | HR 67 | Temp 98.6°F | Ht 71.0 in | Wt 225.4 lb

## 2015-06-29 DIAGNOSIS — F419 Anxiety disorder, unspecified: Secondary | ICD-10-CM

## 2015-06-29 MED ORDER — ALPRAZOLAM 0.5 MG PO TABS: 0.5000 mg | ORAL_TABLET | Freq: Two times a day (BID) | ORAL | Status: DC | PRN

## 2015-06-29 NOTE — Progress Notes (Signed)
   Patient presents to clinic today for follow-up of anxiety after increase in Lexapro. Endorses doing very well overall. Endorses only rare use of Xanax. Denies depressed mood or anhedonia.  Past Medical History  Diagnosis Date  . Anxiety   . Strep throat   . Chicken pox     Current Outpatient Prescriptions on File Prior to Visit  Medication Sig Dispense Refill  . Calcium Carb-Cholecalciferol (CALCIUM-VITAMIN D) 600-400 MG-UNIT TABS Take 1 each by mouth daily.    Marland Kitchen escitalopram (LEXAPRO) 20 MG tablet Take 1 tablet (20 mg total) by mouth daily. 30 tablet 5  . medroxyPROGESTERone (DEPO-PROVERA) 150 MG/ML injection Inject 150 mg into the muscle every 3 (three) months.    . Multiple Vitamin (MULTIVITAMIN) tablet Take 1 tablet by mouth daily.     No current facility-administered medications on file prior to visit.    No Known Allergies  Family History  Problem Relation Age of Onset  . Hypertension Mother   . Arthritis Maternal Grandmother   . Hyperlipidemia Maternal Grandmother   . Hypertension Maternal Grandmother   . Throat cancer Maternal Grandfather   . Stroke Maternal Aunt   . Healthy Sister     x1  . Healthy Daughter     x1    Social History   Social History  . Marital Status: Single    Spouse Name: N/A  . Number of Children: N/A  . Years of Education: N/A   Social History Main Topics  . Smoking status: Former Smoker    Quit date: 07/27/2012  . Smokeless tobacco: Never Used  . Alcohol Use: No  . Drug Use: No  . Sexual Activity: Yes    Birth Control/ Protection: None     Comment: men   Other Topics Concern  . None   Social History Narrative   Review of Systems - See HPI.  All other ROS are negative.  BP 113/61 mmHg  Pulse 67  Temp(Src) 98.6 F (37 C) (Oral)  Ht 5\' 11"  (1.803 m)  Wt 225 lb 6.4 oz (102.241 kg)  BMI 31.45 kg/m2  SpO2 100%  Physical Exam  Constitutional: She is oriented to person, place, and time and well-developed, well-nourished,  and in no distress.  HENT:  Head: Normocephalic and atraumatic.  Eyes: Conjunctivae are normal.  Cardiovascular: Normal rate, regular rhythm, normal heart sounds and intact distal pulses.   Pulmonary/Chest: Effort normal and breath sounds normal. No respiratory distress. She has no wheezes. She has no rales. She exhibits no tenderness.  Neurological: She is alert and oriented to person, place, and time.  Skin: Skin is warm and dry. No rash noted.  Psychiatric: Affect normal.  Vitals reviewed.  No results found for this or any previous visit (from the past 2160 hour(s)).  Assessment/Plan: Anxiety Doing very well on current regimen. Continue medications as directed. Follow-up 6 months.

## 2015-06-29 NOTE — Progress Notes (Signed)
Pre visit review using our clinic review tool, if applicable. No additional management support is needed unless otherwise documented below in the visit note. 

## 2015-06-29 NOTE — Patient Instructions (Signed)
I am glad you are doing well. Continue the medications as directed. We will follow-up in 6 months. Return sooner if you need me.

## 2015-06-29 NOTE — Assessment & Plan Note (Signed)
Doing very well on current regimen. Continue medications as directed. Follow-up 6 months.

## 2015-07-27 LAB — HM PAP SMEAR: HM Pap smear: NORMAL

## 2015-08-15 ENCOUNTER — Other Ambulatory Visit: Payer: Self-pay | Admitting: *Deleted

## 2015-08-15 DIAGNOSIS — F419 Anxiety disorder, unspecified: Secondary | ICD-10-CM

## 2015-08-15 MED ORDER — ESCITALOPRAM OXALATE 20 MG PO TABS: 20.0000 mg | ORAL_TABLET | Freq: Every day | ORAL | Status: DC

## 2015-08-15 NOTE — Telephone Encounter (Signed)
Rx sent to the pharmacy by e-script.//AB/CMA 

## 2015-09-08 ENCOUNTER — Encounter: Payer: Self-pay | Admitting: Medical

## 2015-09-08 ENCOUNTER — Ambulatory Visit (INDEPENDENT_AMBULATORY_CARE_PROVIDER_SITE_OTHER): Payer: BLUE CROSS/BLUE SHIELD | Admitting: Medical

## 2015-09-08 VITALS — BP 114/80 | HR 81 | Temp 98.1°F | Ht 71.0 in | Wt 225.0 lb

## 2015-09-08 DIAGNOSIS — B029 Zoster without complications: Secondary | ICD-10-CM | POA: Diagnosis not present

## 2015-09-08 MED ORDER — FAMCICLOVIR 500 MG PO TABS: 500.0000 mg | ORAL_TABLET | Freq: Three times a day (TID) | ORAL | Status: DC

## 2015-09-08 MED ORDER — DOXYCYCLINE HYCLATE 100 MG PO TABS: 100.0000 mg | ORAL_TABLET | Freq: Two times a day (BID) | ORAL | Status: DC

## 2015-09-08 NOTE — Progress Notes (Signed)
Subjective:    Patient ID: Olivia Reyes, female    DOB: June 14, 1985, 30 y.o.   MRN: [id number]  HPI  Pt in with small cluster of small blister type outbreak which she saw yesterday on left thigh(on Tuesday mild pain). This is in same area where she had prior shingles about one year ago.   She has some itching.   LMP- on depoprovera. Due next one end of april   Review of Systems  Constitutional: Negative for fever, chills and fatigue.  Respiratory: Negative for cough, chest tightness and wheezing.   Cardiovascular: Negative for chest pain and palpitations.  Skin: Positive for rash.  Neurological: Negative for dizziness and headaches.  Hematological: Negative for adenopathy. Does not bruise/bleed easily.  Psychiatric/Behavioral: Negative for behavioral problems and confusion.    Past Medical History  Diagnosis Date  . Anxiety   . Strep throat   . Chicken pox      Social History   Social History  . Marital Status: Single    Spouse Name: N/A  . Number of Children: N/A  . Years of Education: N/A   Occupational History  . Not on file.   Social History Main Topics  . Smoking status: Former Smoker    Quit date: 07/27/2012  . Smokeless tobacco: Never Used  . Alcohol Use: No  . Drug Use: No  . Sexual Activity: Yes    Birth Control/ Protection: None     Comment: men   Other Topics Concern  . Not on file   Social History Narrative    Past Surgical History  Procedure Laterality Date  . Cholecystectomy  2013  . Wisdom tooth extraction      Family History  Problem Relation Age of Onset  . Hypertension Mother   . Arthritis Maternal Grandmother   . Hyperlipidemia Maternal Grandmother   . Hypertension Maternal Grandmother   . Throat cancer Maternal Grandfather   . Stroke Maternal Aunt   . Healthy Sister     x1  . Healthy Daughter     x1    No Known Allergies  Current Outpatient Prescriptions on File Prior to Visit  Medication Sig Dispense Refill  .  ALPRAZolam (XANAX) 0.5 MG tablet Take 1 tablet (0.5 mg total) by mouth 2 (two) times daily as needed for anxiety. 30 tablet 2  . Calcium Carb-Cholecalciferol (CALCIUM-VITAMIN D) 600-400 MG-UNIT TABS Take 1 each by mouth daily.    Marland Kitchen escitalopram (LEXAPRO) 20 MG tablet Take 1 tablet (20 mg total) by mouth daily. 90 tablet 1  . medroxyPROGESTERone (DEPO-PROVERA) 150 MG/ML injection Inject 150 mg into the muscle every 3 (three) months.    . Multiple Vitamin (MULTIVITAMIN) tablet Take 1 tablet by mouth daily.     No current facility-administered medications on file prior to visit.    BP 114/80 mmHg  Pulse 81  Temp(Src) 98.1 F (36.7 C) (Oral)  Ht 5\' 11"  (1.803 m)  Wt 225 lb (102.059 kg)  BMI 31.39 kg/m2  SpO2 99%       Objective:   Physical Exam  General- No acute distress. Pleasant patient.  Skin- back of leg posterior thigh below left buttox. Area of redness 2.5 cm. Scattered small vesicles present. On palpation area only faint tender at best.       Assessment & Plan:  By history and appearance moderate to highly  suspicion for shingles will rx famvir.  Some of redness concern for possible secondary bacterial infection. If area of redness expands  or worsens indicating then start doxycycline antibiotic.  Follow up in 6-7  days or as needed

## 2015-09-08 NOTE — Progress Notes (Signed)
Pre visit review using our clinic review tool, if applicable. No additional management support is needed unless otherwise documented below in the visit note. 

## 2015-09-08 NOTE — Patient Instructions (Addendum)
By history and appearance moderate to highly  suspicion for shingles will rx famvir.  Some of redness concern for possible secondary bacterial infection. If area of redness expands or worsens indicating then start doxycycline antibiotic.  Follow up in 6-7  days or as needed

## 2015-09-14 ENCOUNTER — Ambulatory Visit (INDEPENDENT_AMBULATORY_CARE_PROVIDER_SITE_OTHER): Payer: BLUE CROSS/BLUE SHIELD | Admitting: Physician Assistant

## 2015-09-14 VITALS — BP 108/60 | HR 53 | Temp 98.8°F | Ht 71.0 in | Wt 224.0 lb

## 2015-09-14 DIAGNOSIS — B029 Zoster without complications: Secondary | ICD-10-CM

## 2015-09-14 MED ORDER — FAMCICLOVIR 500 MG PO TABS: 500.0000 mg | ORAL_TABLET | Freq: Three times a day (TID) | ORAL | Status: DC

## 2015-09-14 NOTE — Patient Instructions (Signed)
Please stay well hydrated. Start saline nasal spray and a daily Zyrtec for allergy symptoms. Let me know if symptoms are not improving.  I am extending the famciclovir for a few more days giving you had another outbreak while on treatment. I am setting you up with an Immunologist due to the number of outbreaks you have had.

## 2015-09-14 NOTE — Progress Notes (Signed)
Pre visit review using our clinic review tool, if applicable. No additional management support is needed unless otherwise documented below in the visit note. 

## 2015-09-15 ENCOUNTER — Encounter: Payer: Self-pay | Admitting: Physician Assistant

## 2015-09-15 NOTE — Progress Notes (Signed)
Patient presents to clinic today for follow-up of shingles rash to left lower back/side. Was seen last week and started on Famciclovir. Endorses blisters have scabbed over but she has a new area that looks similar to previous shingles outbreaks noted on her R thoracic back. Is continuing medications as directed. Denies pain of new area but notes mild pruritus.    Past Medical History  Diagnosis Date  . Anxiety   . Strep throat   . Chicken pox     Current Outpatient Prescriptions on File Prior to Visit  Medication Sig Dispense Refill  . ALPRAZolam (XANAX) 0.5 MG tablet Take 1 tablet (0.5 mg total) by mouth 2 (two) times daily as needed for anxiety. 30 tablet 2  . Calcium Carb-Cholecalciferol (CALCIUM-VITAMIN D) 600-400 MG-UNIT TABS Take 1 each by mouth daily.    Marland Kitchen escitalopram (LEXAPRO) 20 MG tablet Take 1 tablet (20 mg total) by mouth daily. 90 tablet 1  . medroxyPROGESTERone (DEPO-PROVERA) 150 MG/ML injection Inject 150 mg into the muscle every 3 (three) months.    . Multiple Vitamin (MULTIVITAMIN) tablet Take 1 tablet by mouth daily.    Marland Kitchen doxycycline (VIBRA-TABS) 100 MG tablet Take 1 tablet (100 mg total) by mouth 2 (two) times daily. (Patient not taking: Reported on 09/14/2015) 20 tablet 0   No current facility-administered medications on file prior to visit.    No Known Allergies  Family History  Problem Relation Age of Onset  . Hypertension Mother   . Arthritis Maternal Grandmother   . Hyperlipidemia Maternal Grandmother   . Hypertension Maternal Grandmother   . Throat cancer Maternal Grandfather   . Stroke Maternal Aunt   . Healthy Sister     x1  . Healthy Daughter     x1    Social History   Social History  . Marital Status: Single    Spouse Name: N/A  . Number of Children: N/A  . Years of Education: N/A   Social History Main Topics  . Smoking status: Former Smoker    Quit date: 07/27/2012  . Smokeless tobacco: Never Used  . Alcohol Use: No  . Drug Use: No    . Sexual Activity: Yes    Birth Control/ Protection: None     Comment: men   Other Topics Concern  . Not on file   Social History Narrative    Review of Systems - See HPI.  All other ROS are negative.  BP 108/60 mmHg  Pulse 53  Temp(Src) 98.8 F (37.1 C)  Ht 5\' 11"  (1.803 m)  Wt 224 lb (101.606 kg)  BMI 31.26 kg/m2  SpO2 98%  Physical Exam  Constitutional: She is oriented to person, place, and time and well-developed, well-nourished, and in no distress.  HENT:  Head: Normocephalic and atraumatic.  Eyes: Conjunctivae are normal.  Neck: Neck supple.  Cardiovascular: Normal rate, regular rhythm, normal heart sounds and intact distal pulses.   Pulmonary/Chest: Effort normal and breath sounds normal. No respiratory distress. She has no wheezes. She has no rales. She exhibits no tenderness.  Neurological: She is alert and oriented to person, place, and time.  Skin: Skin is warm and dry.     Psychiatric: Affect normal.  Vitals reviewed.    Assessment/Plan: 1. Shingles rash Initial rash resolving well. New rash of R thoracic region. Will extend Famciclovir to total of 10 days. Supportive measures reviewed. Patient without known history of immunocompromise. Will refer to Immunology giving the frequency of shingles outbreaks.  - Ambulatory  referral to Immunology

## 2015-09-22 DIAGNOSIS — Z01419 Encounter for gynecological examination (general) (routine) without abnormal findings: Secondary | ICD-10-CM | POA: Diagnosis not present

## 2015-09-22 DIAGNOSIS — Z3042 Encounter for surveillance of injectable contraceptive: Secondary | ICD-10-CM | POA: Diagnosis not present

## 2015-09-22 DIAGNOSIS — Z6833 Body mass index (BMI) 33.0-33.9, adult: Secondary | ICD-10-CM | POA: Diagnosis not present

## 2015-10-20 ENCOUNTER — Telehealth: Payer: Self-pay | Admitting: Physician Assistant

## 2015-10-20 NOTE — Telephone Encounter (Signed)
Patient Name: Olivia Reyes  DOB: 13-May-1986    Initial Comment Caller states having severe swelling in feet and legs. thinks her BP is elevated    Nurse Assessment  Nurse: Raphael Gibney, RN, Vanita Ingles Date/Time (Eastern Time): 10/20/2015 10:54:43 AM  Confirm and document reason for call. If symptomatic, describe symptoms. You must click the next button to save text entered. ---Caller states she is having a lot of swelling in her feet and legs. Drinking water. Had swelling in both legs to her calves last night. She elevated her feet last night and swelling went down. Feet are started to swell again a little  Has the patient traveled out of the country within the last 30 days? ---Not Applicable  Does the patient have any new or worsening symptoms? ---Yes  Will a triage be completed? ---Yes  Related visit to physician within the last 2 weeks? ---No  Does the PT have any chronic conditions? (i.e. diabetes, asthma, etc.) ---No  Is the patient pregnant or possibly pregnant? (Ask all females between the ages of 6-55) ---No  Is this a behavioral health or substance abuse call? ---No     Guidelines    Guideline Title Affirmed Question Affirmed Notes  Leg Swelling and Edema [1] MILD swelling of both ankles (i.e., pedal edema) AND [2] new onset or worsening    Final Disposition User   See PCP When Office is Open (within 3 days) Raphael Gibney, Therapist, sports, Vanita Ingles    Comments  No appts available with Elyn Aquas today or tomorrow. States she does not want to see another provider and will see if her mother can get her into her urgent clinic.  Both ankles are a little swollen   Disagree/Comply: Comply

## 2015-10-20 NOTE — Telephone Encounter (Signed)
Patient called asking when her last CPE was. Informed that she had not had a CPE since she became a patient here. Scheduled her CPE. She then stated that she was having feet and leg swelling and thinks her BP is elevated. Requested to speak with a nurse and was informed that I could either send a message to Gilmore Laroche and have her call back or transfer her to [facility] where she could speak directly to a nurse. Patient became irate and stated that she needed to be seen by her provider Einar Pheasant) and wanted to know when he had an available appointment. Informed her that he was not in today but would return tomorrow. Informed her that he had a 10:45 available tomorrow  But she wanted to be seen before 10:45. Advised patient that I could transfer her to [facility] and she could speak with a nurse. Transferred her to [facility] at [employer].

## 2015-10-21 ENCOUNTER — Ambulatory Visit (INDEPENDENT_AMBULATORY_CARE_PROVIDER_SITE_OTHER): Payer: BLUE CROSS/BLUE SHIELD | Admitting: Physician Assistant

## 2015-10-21 ENCOUNTER — Encounter: Payer: Self-pay | Admitting: Physician Assistant

## 2015-10-21 VITALS — BP 124/78 | HR 75 | Temp 98.4°F | Ht 71.0 in | Wt 225.5 lb

## 2015-10-21 DIAGNOSIS — R609 Edema, unspecified: Secondary | ICD-10-CM

## 2015-10-21 NOTE — Patient Instructions (Signed)
Please follow diet below to help limit salt intake in diet to help with swelling. Remember to stay hydrated. Keep moving at work. Staying still too long helps fluid pool in the lower extremity.  Follow-up as scheduled for your physical.  DASH Eating Plan DASH stands for "Dietary Approaches to Stop Hypertension." The DASH eating plan is a healthy eating plan that has been shown to reduce high blood pressure (hypertension). Additional health benefits may include reducing the risk of type 2 diabetes mellitus, heart disease, and stroke. The DASH eating plan may also help with weight loss. WHAT DO I NEED TO KNOW ABOUT THE DASH EATING PLAN? For the DASH eating plan, you will follow these general guidelines:  Choose foods with a percent daily value for sodium of less than 5% (as listed on the food label).  Use salt-free seasonings or herbs instead of table salt or sea salt.  Check with your health care provider or pharmacist before using salt substitutes.  Eat lower-sodium products, often labeled as "lower sodium" or "no salt added."  Eat fresh foods.  Eat more vegetables, fruits, and low-fat dairy products.  Choose whole grains. Look for the word "whole" as the first word in the ingredient list.  Choose fish and skinless chicken or [place] more often than red meat. Limit fish, poultry, and meat to 6 oz (170 g) each day.  Limit sweets, desserts, sugars, and sugary drinks.  Choose heart-healthy fats.  Limit cheese to 1 oz (28 g) per day.  Eat more home-cooked food and less restaurant, buffet, and fast food.  Limit fried foods.  Cook foods using methods other than frying.  Limit canned vegetables. If you do use them, rinse them well to decrease the sodium.  When eating at a restaurant, ask that your food be prepared with less salt, or no salt if possible. WHAT FOODS CAN I EAT? Seek help from a dietitian for individual calorie needs. Grains Whole grain or whole wheat bread. Brown  rice. Whole grain or whole wheat pasta. Quinoa, bulgur, and whole grain cereals. Low-sodium cereals. Corn or whole wheat flour tortillas. Whole grain cornbread. Whole grain crackers. Low-sodium crackers. Vegetables Fresh or frozen vegetables (raw, steamed, roasted, or grilled). Low-sodium or reduced-sodium tomato and vegetable juices. Low-sodium or reduced-sodium tomato sauce and paste. Low-sodium or reduced-sodium canned vegetables.  Fruits All fresh, canned (in natural juice), or frozen fruits. Meat and Other Protein Products Ground beef (85% or leaner), grass-fed beef, or beef trimmed of fat. Skinless chicken or [place]. Ground chicken or [place]. Pork trimmed of fat. All fish and seafood. Eggs. Dried beans, peas, or lentils. Unsalted nuts and seeds. Unsalted canned beans. Dairy Low-fat dairy products, such as skim or 1% milk, 2% or reduced-fat cheeses, low-fat ricotta or cottage cheese, or plain low-fat yogurt. Low-sodium or reduced-sodium cheeses. Fats and Oils Tub margarines without trans fats. Light or reduced-fat mayonnaise and salad dressings (reduced sodium). Avocado. Safflower, olive, or canola oils. Natural peanut or almond butter. Other Unsalted popcorn and pretzels. The items listed above may not be a complete list of recommended foods or beverages. Contact your dietitian for more options. WHAT FOODS ARE NOT RECOMMENDED? Grains White bread. White pasta. White rice. Refined cornbread. Bagels and croissants. Crackers that contain trans fat. Vegetables Creamed or fried vegetables. Vegetables in a cheese sauce. Regular canned vegetables. Regular canned tomato sauce and paste. Regular tomato and vegetable juices. Fruits Dried fruits. Canned fruit in light or heavy syrup. Fruit juice. Meat and Other Protein Products Fatty  cuts of meat. Ribs, chicken wings, bacon, sausage, bologna, salami, chitterlings, fatback, hot dogs, bratwurst, and packaged luncheon meats. Salted nuts and seeds.  Canned beans with salt. Dairy Whole or 2% milk, cream, half-and-half, and cream cheese. Whole-fat or sweetened yogurt. Full-fat cheeses or blue cheese. Nondairy creamers and whipped toppings. Processed cheese, cheese spreads, or cheese curds. Condiments Onion and garlic salt, seasoned salt, table salt, and sea salt. Canned and packaged gravies. Worcestershire sauce. Tartar sauce. Barbecue sauce. Teriyaki sauce. Soy sauce, including reduced sodium. Steak sauce. Fish sauce. Oyster sauce. Cocktail sauce. Horseradish. Ketchup and mustard. Meat flavorings and tenderizers. Bouillon cubes. Hot sauce. Tabasco sauce. Marinades. Taco seasonings. Relishes. Fats and Oils Butter, stick margarine, lard, shortening, ghee, and bacon fat. Coconut, palm kernel, or palm oils. Regular salad dressings. Other Pickles and olives. Salted popcorn and pretzels. The items listed above may not be a complete list of foods and beverages to avoid. Contact your dietitian for more information. WHERE CAN I FIND MORE INFORMATION? National Heart, Lung, and Blood Institute: travelstabloid.com   This information is not intended to replace advice given to you by your health care provider. Make sure you discuss any questions you have with your health care provider.   Document Released: 05/03/2011 Document Revised: 06/04/2014 Document Reviewed: 03/18/2013 Elsevier Interactive Patient Education [employer].

## 2015-10-21 NOTE — Progress Notes (Signed)
Pre visit review using our clinic review tool, if applicable. No additional management support is needed unless otherwise documented below in the visit note. 

## 2015-10-21 NOTE — Progress Notes (Signed)
Patient presents to clinic today c/o bilateral foot and ankle swelling since Wednesday. Denies change in diet or exercise routine. Endorses feet look normal in the morning and swelling is only noted in the evening after working all day. Endorses mild improvement with elevation of the legs. Endorses mother with history of venous insufficiency.   Past Medical History  Diagnosis Date  . Anxiety   . Strep throat   . Chicken pox     Current Outpatient Prescriptions on File Prior to Visit  Medication Sig Dispense Refill  . ALPRAZolam (XANAX) 0.5 MG tablet Take 1 tablet (0.5 mg total) by mouth 2 (two) times daily as needed for anxiety. 30 tablet 2  . Calcium Carb-Cholecalciferol (CALCIUM-VITAMIN D) 600-400 MG-UNIT TABS Take 1 each by mouth daily.    Marland Kitchen escitalopram (LEXAPRO) 20 MG tablet Take 1 tablet (20 mg total) by mouth daily. 90 tablet 1  . medroxyPROGESTERone (DEPO-PROVERA) 150 MG/ML injection Inject 150 mg into the muscle every 3 (three) months.    . Multiple Vitamin (MULTIVITAMIN) tablet Take 1 tablet by mouth daily.    Marland Kitchen doxycycline (VIBRA-TABS) 100 MG tablet Take 1 tablet (100 mg total) by mouth 2 (two) times daily. (Patient not taking: Reported on 09/14/2015) 20 tablet 0  . famciclovir (FAMVIR) 500 MG tablet Take 1 tablet (500 mg total) by mouth 3 (three) times daily. (Patient not taking: Reported on 10/21/2015) 9 tablet 0   No current facility-administered medications on file prior to visit.    No Known Allergies  Family History  Problem Relation Age of Onset  . Hypertension Mother   . Arthritis Maternal Grandmother   . Hyperlipidemia Maternal Grandmother   . Hypertension Maternal Grandmother   . Throat cancer Maternal Grandfather   . Stroke Maternal Aunt   . Healthy Sister     x1  . Healthy Daughter     x1    Social History   Social History  . Marital Status: Single    Spouse Name: N/A  . Number of Children: N/A  . Years of Education: N/A   Social History Main  Topics  . Smoking status: Former Smoker    Quit date: 07/27/2012  . Smokeless tobacco: Never Used  . Alcohol Use: No  . Drug Use: No  . Sexual Activity: Yes    Birth Control/ Protection: None     Comment: men   Other Topics Concern  . None   Social History Narrative   Review of Systems - See HPI.  All other ROS are negative.  BP 124/78 mmHg  Pulse 75  Temp(Src) 98.4 F (36.9 C) (Oral)  Ht 5\' 11"  (1.803 m)  Wt 225 lb 8 oz (102.286 kg)  BMI 31.46 kg/m2  SpO2 97%  Physical Exam  Constitutional: She is oriented to person, place, and time and well-developed, well-nourished, and in no distress.  HENT:  Head: Normocephalic and atraumatic.  Eyes: Conjunctivae are normal.  Neck: Neck supple.  Cardiovascular: Normal rate, regular rhythm, normal heart sounds and intact distal pulses.   Pulmonary/Chest: Effort normal and breath sounds normal. No respiratory distress. She has no wheezes. She has no rales. She exhibits no tenderness.  Neurological: She is alert and oriented to person, place, and time.  Skin: Skin is warm and dry. No rash noted.  Psychiatric: Affect normal.  Vitals reviewed.  Assessment/Plan: 1. Peripheral edema Examination negative for swelling. Suspect dietary versus mild venous insufficiency coupled with prolonged standing each day. Dietary measures discussed. Supportive measures and  elevation reviewed. FU as scheduled. If not improving, will consider further assessment.    Leeanne Rio, PA-C

## 2015-11-03 ENCOUNTER — Ambulatory Visit: Payer: BLUE CROSS/BLUE SHIELD | Admitting: Internal Medicine

## 2015-11-11 ENCOUNTER — Encounter: Payer: Self-pay | Admitting: Physician Assistant

## 2015-11-11 ENCOUNTER — Ambulatory Visit (INDEPENDENT_AMBULATORY_CARE_PROVIDER_SITE_OTHER): Payer: BLUE CROSS/BLUE SHIELD | Admitting: Physician Assistant

## 2015-11-11 VITALS — BP 115/67 | HR 85 | Temp 98.2°F | Resp 16 | Ht 71.0 in | Wt 227.2 lb

## 2015-11-11 DIAGNOSIS — R635 Abnormal weight gain: Secondary | ICD-10-CM | POA: Diagnosis not present

## 2015-11-11 DIAGNOSIS — F419 Anxiety disorder, unspecified: Secondary | ICD-10-CM | POA: Diagnosis not present

## 2015-11-11 DIAGNOSIS — Z0000 Encounter for general adult medical examination without abnormal findings: Secondary | ICD-10-CM | POA: Diagnosis not present

## 2015-11-11 DIAGNOSIS — E669 Obesity, unspecified: Secondary | ICD-10-CM | POA: Insufficient documentation

## 2015-11-11 LAB — CBC
HCT: 43.2 % (ref 36.0–46.0)
Hemoglobin: 14.6 g/dL (ref 12.0–15.0)
MCHC: 33.8 g/dL (ref 30.0–36.0)
MCV: 89.4 fl (ref 78.0–100.0)
Platelets: 254 10*3/uL (ref 150.0–400.0)
RBC: 4.83 Mil/uL (ref 3.87–5.11)
RDW: 13.7 % (ref 11.5–15.5)
WBC: 7.6 10*3/uL (ref 4.0–10.5)

## 2015-11-11 LAB — LIPID PANEL
CHOL/HDL RATIO: 4
Cholesterol: 179 mg/dL (ref 0–200)
HDL: 43.1 mg/dL (ref >39.00)
LDL CALC: 122 mg/dL — AB (ref 0–99)
NonHDL: 135.86
Triglycerides: 67 mg/dL (ref 0.0–149.0)
VLDL: 13.4 mg/dL (ref 0.0–40.0)

## 2015-11-11 LAB — URINALYSIS, ROUTINE W REFLEX MICROSCOPIC
BILIRUBIN URINE: NEGATIVE
Ketones, ur: NEGATIVE
Leukocytes, UA: NEGATIVE
Nitrite: NEGATIVE
Specific Gravity, Urine: 1.015 (ref 1.000–1.030)
TOTAL PROTEIN, URINE-UPE24: NEGATIVE
Urine Glucose: NEGATIVE
Urobilinogen, UA: 1 (ref 0.0–1.0)
pH: 7.5 (ref 5.0–8.0)

## 2015-11-11 LAB — COMPREHENSIVE METABOLIC PANEL
ALT: 16 U/L (ref 0–35)
AST: 16 U/L (ref 0–37)
Albumin: 4.4 g/dL (ref 3.5–5.2)
Alkaline Phosphatase: 71 U/L (ref 39–117)
BUN: 12 mg/dL (ref 6–23)
CO2: 22 meq/L (ref 19–32)
CREATININE: 0.78 mg/dL (ref 0.40–1.20)
Calcium: 9.5 mg/dL (ref 8.4–10.5)
Chloride: 109 mEq/L (ref 96–112)
GFR: 92.85 mL/min (ref >60.00)
GLUCOSE: 100 mg/dL — AB (ref 70–99)
Potassium: 3.9 mEq/L (ref 3.5–5.1)
SODIUM: 140 meq/L (ref 135–145)
Total Bilirubin: 0.5 mg/dL (ref 0.2–1.2)
Total Protein: 7.2 g/dL (ref 6.0–8.3)

## 2015-11-11 LAB — HEMOGLOBIN A1C: HEMOGLOBIN A1C: 5.4 % (ref 4.6–6.5)

## 2015-11-11 LAB — TSH: TSH: 2.03 u[IU]/mL (ref 0.35–4.50)

## 2015-11-11 LAB — T4, FREE: FREE T4: 0.87 ng/dL (ref 0.60–1.60)

## 2015-11-11 NOTE — Patient Instructions (Signed)
Please go to the lab for blood work.   Our office will call you with your results unless you have chosen to receive results via MyChart.  If your blood work is normal we will follow-up each year for physicals and as scheduled for chronic medical problems.  If anything is abnormal we will treat accordingly and get you in for a follow-up.  Please continue medications as directed.   Preventive Care for Adults, Female A healthy lifestyle and preventive care can promote health and wellness. Preventive health guidelines for women include the following key practices.  A routine yearly physical is a good way to check with your health care provider about your health and preventive screening. It is a chance to share any concerns and updates on your health and to receive a thorough exam.  Visit your dentist for a routine exam and preventive care every 6 months. Brush your teeth twice a day and floss once a day. Good oral hygiene prevents tooth decay and gum disease.  The frequency of eye exams is based on your age, health, family medical history, use of contact lenses, and other factors. Follow your health care provider's recommendations for frequency of eye exams.  Eat a healthy diet. Foods like vegetables, fruits, whole grains, low-fat dairy products, and lean protein foods contain the nutrients you need without too many calories. Decrease your intake of foods high in solid fats, added sugars, and salt. Eat the right amount of calories for you.Get information about a proper diet from your health care provider, if necessary.  Regular physical exercise is one of the most important things you can do for your health. Most adults should get at least 150 minutes of moderate-intensity exercise (any activity that increases your heart rate and causes you to sweat) each week. In addition, most adults need muscle-strengthening exercises on 2 or more days a week.  Maintain a healthy weight. The body mass index  (BMI) is a screening tool to identify possible weight problems. It provides an estimate of body fat based on height and weight. Your health care provider can find your BMI and can help you achieve or maintain a healthy weight.For adults 20 years and older:  A BMI below 18.5 is considered underweight.  A BMI of 18.5 to 24.9 is normal.  A BMI of 25 to 29.9 is considered overweight.  A BMI of 30 and above is considered obese.  Maintain normal blood lipids and cholesterol levels by exercising and minimizing your intake of saturated fat. Eat a balanced diet with plenty of fruit and vegetables. Blood tests for lipids and cholesterol should begin at age 64 and be repeated every 5 years. If your lipid or cholesterol levels are high, you are over 50, or you are at high risk for heart disease, you may need your cholesterol levels checked more frequently.Ongoing high lipid and cholesterol levels should be treated with medicines if diet and exercise are not working.  If you smoke, find out from your health care provider how to quit. If you do not use tobacco, do not start.  Lung cancer screening is recommended for adults aged 13-80 years who are at high risk for developing lung cancer because of a history of smoking. A yearly low-dose CT scan of the lungs is recommended for people who have at least a 30-pack-year history of smoking and are a current smoker or have quit within the past 15 years. A pack year of smoking is smoking an average of 1 pack  of cigarettes a day for 1 year (for example: 1 pack a day for 30 years or 2 packs a day for 15 years). Yearly screening should continue until the smoker has stopped smoking for at least 15 years. Yearly screening should be stopped for people who develop a health problem that would prevent them from having lung cancer treatment.  If you are pregnant, do not drink alcohol. If you are breastfeeding, be very cautious about drinking alcohol. If you are not pregnant and  choose to drink alcohol, do not have more than 1 drink per day. One drink is considered to be 12 ounces (355 mL) of beer, 5 ounces (148 mL) of wine, or 1.5 ounces (44 mL) of liquor.  Avoid use of street drugs. Do not share needles with anyone. Ask for help if you need support or instructions about stopping the use of drugs.  High blood pressure causes heart disease and increases the risk of stroke. Your blood pressure should be checked at least every 1 to 2 years. Ongoing high blood pressure should be treated with medicines if weight loss and exercise do not work.  If you are 31-21 years old, ask your health care provider if you should take aspirin to prevent strokes.  Diabetes screening is done by taking a blood sample to check your blood glucose level after you have not eaten for a certain period of time (fasting). If you are not overweight and you do not have risk factors for diabetes, you should be screened once every 3 years starting at age 26. If you are overweight or obese and you are 30-15 years of age, you should be screened for diabetes every year as part of your cardiovascular risk assessment.  Breast cancer screening is essential preventive care for women. You should practice "breast self-awareness." This means understanding the normal appearance and feel of your breasts and may include breast self-examination. Any changes detected, no matter how small, should be reported to a health care provider. Women in their 77s and 30s should have a clinical breast exam (CBE) by a health care provider as part of a regular health exam every 1 to 3 years. After age 55, women should have a CBE every year. Starting at age 12, women should consider having a mammogram (breast X-ray test) every year. Women who have a family history of breast cancer should talk to their health care provider about genetic screening. Women at a high risk of breast cancer should talk to their health care providers about having an  MRI and a mammogram every year.  Breast cancer gene (BRCA)-related cancer risk assessment is recommended for women who have family members with BRCA-related cancers. BRCA-related cancers include breast, ovarian, tubal, and peritoneal cancers. Having family members with these cancers may be associated with an increased risk for harmful changes (mutations) in the breast cancer genes BRCA1 and BRCA2. Results of the assessment will determine the need for genetic counseling and BRCA1 and BRCA2 testing.  Your health care provider may recommend that you be screened regularly for cancer of the pelvic organs (ovaries, uterus, and vagina). This screening involves a pelvic examination, including checking for microscopic changes to the surface of your cervix (Pap test). You may be encouraged to have this screening done every 3 years, beginning at age 2.  For women ages 72-65, health care providers may recommend pelvic exams and Pap testing every 3 years, or they may recommend the Pap and pelvic exam, combined with testing for human papilloma  virus (HPV), every 5 years. Some types of HPV increase your risk of cervical cancer. Testing for HPV may also be done on women of any age with unclear Pap test results.  Other health care providers may not recommend any screening for nonpregnant women who are considered low risk for pelvic cancer and who do not have symptoms. Ask your health care provider if a screening pelvic exam is right for you.  If you have had past treatment for cervical cancer or a condition that could lead to cancer, you need Pap tests and screening for cancer for at least 20 years after your treatment. If Pap tests have been discontinued, your risk factors (such as having a new sexual partner) need to be reassessed to determine if screening should resume. Some women have medical problems that increase the chance of getting cervical cancer. In these cases, your health care provider may recommend more  frequent screening and Pap tests.  Colorectal cancer can be detected and often prevented. Most routine colorectal cancer screening begins at the age of 65 years and continues through age 64 years. However, your health care provider may recommend screening at an earlier age if you have risk factors for colon cancer. On a yearly basis, your health care provider may provide home test kits to check for hidden blood in the stool. Use of a small camera at the end of a tube, to directly examine the colon (sigmoidoscopy or colonoscopy), can detect the earliest forms of colorectal cancer. Talk to your health care provider about this at age 81, when routine screening begins. Direct exam of the colon should be repeated every 5-10 years through age 28 years, unless early forms of precancerous polyps or small growths are found.  People who are at an increased risk for hepatitis B should be screened for this virus. You are considered at high risk for hepatitis B if:  You were born in a country where hepatitis B occurs often. Talk with your health care provider about which countries are considered high risk.  Your parents were born in a high-risk country and you have not received a shot to protect against hepatitis B (hepatitis B vaccine).  You have HIV or AIDS.  You use needles to inject street drugs.  You live with, or have sex with, someone who has hepatitis B.  You get hemodialysis treatment.  You take certain medicines for conditions like cancer, organ transplantation, and autoimmune conditions.  Hepatitis C blood testing is recommended for all people born from [birth year] through 1965 and any individual with known risks for hepatitis C.  Practice safe sex. Use condoms and avoid high-risk sexual practices to reduce the spread of sexually transmitted infections (STIs). STIs include gonorrhea, chlamydia, syphilis, trichomonas, herpes, HPV, and human immunodeficiency virus (HIV). Herpes, HIV, and HPV are viral  illnesses that have no cure. They can result in disability, cancer, and death.  You should be screened for sexually transmitted illnesses (STIs) including gonorrhea and chlamydia if:  You are sexually active and are younger than 24 years.  You are older than 24 years and your health care provider tells you that you are at risk for this type of infection.  Your sexual activity has changed since you were last screened and you are at an increased risk for chlamydia or gonorrhea. Ask your health care provider if you are at risk.  If you are at risk of being infected with HIV, it is recommended that you take a prescription medicine daily  to prevent HIV infection. This is called preexposure prophylaxis (PrEP). You are considered at risk if:  You are sexually active and do not regularly use condoms or know the HIV status of your partner(s).  You take drugs by injection.  You are sexually active with a partner who has HIV.  Talk with your health care provider about whether you are at high risk of being infected with HIV. If you choose to begin PrEP, you should first be tested for HIV. You should then be tested every 3 months for as long as you are taking PrEP.  Osteoporosis is a disease in which the bones lose minerals and strength with aging. This can result in serious bone fractures or breaks. The risk of osteoporosis can be identified using a bone density scan. Women ages 40 years and over and women at risk for fractures or osteoporosis should discuss screening with their health care providers. Ask your health care provider whether you should take a calcium supplement or vitamin D to reduce the rate of osteoporosis.  Menopause can be associated with physical symptoms and risks. Hormone replacement therapy is available to decrease symptoms and risks. You should talk to your health care provider about whether hormone replacement therapy is right for you.  Use sunscreen. Apply sunscreen liberally and  repeatedly throughout the day. You should seek shade when your shadow is shorter than you. Protect yourself by wearing long sleeves, pants, a wide-brimmed hat, and sunglasses year round, whenever you are outdoors.  Once a month, do a whole body skin exam, using a mirror to look at the skin on your back. Tell your health care provider of new moles, moles that have irregular borders, moles that are larger than a pencil eraser, or moles that have changed in shape or color.  Stay current with required vaccines (immunizations).  Influenza vaccine. All adults should be immunized every year.  Tetanus, diphtheria, and acellular pertussis (Td, Tdap) vaccine. Pregnant women should receive 1 dose of Tdap vaccine during each pregnancy. The dose should be obtained regardless of the length of time since the last dose. Immunization is preferred during the 27th-36th week of gestation. An adult who has not previously received Tdap or who does not know her vaccine status should receive 1 dose of Tdap. This initial dose should be followed by tetanus and diphtheria toxoids (Td) booster doses every 10 years. Adults with an unknown or incomplete history of completing a 3-dose immunization series with Td-containing vaccines should begin or complete a primary immunization series including a Tdap dose. Adults should receive a Td booster every 10 years.  Varicella vaccine. An adult without evidence of immunity to varicella should receive 2 doses or a second dose if she has previously received 1 dose. Pregnant females who do not have evidence of immunity should receive the first dose after pregnancy. This first dose should be obtained before leaving the health care facility. The second dose should be obtained 4-8 weeks after the first dose.  Human papillomavirus (HPV) vaccine. Females aged 13-26 years who have not received the vaccine previously should obtain the 3-dose series. The vaccine is not recommended for use in pregnant  females. However, pregnancy testing is not needed before receiving a dose. If a female is found to be pregnant after receiving a dose, no treatment is needed. In that case, the remaining doses should be delayed until after the pregnancy. Immunization is recommended for any person with an immunocompromised condition through the age of 84 years if  she did not get any or all doses earlier. During the 3-dose series, the second dose should be obtained 4-8 weeks after the first dose. The third dose should be obtained 24 weeks after the first dose and 16 weeks after the second dose.  Zoster vaccine. One dose is recommended for adults aged 94 years or older unless certain conditions are present.  Measles, mumps, and rubella (MMR) vaccine. Adults born before [birth year] generally are considered immune to measles and mumps. Adults born in [birth year] or later should have 1 or more doses of MMR vaccine unless there is a contraindication to the vaccine or there is laboratory evidence of immunity to each of the three diseases. A routine second dose of MMR vaccine should be obtained at least 28 days after the first dose for students attending postsecondary schools, health care workers, or international travelers. People who received inactivated measles vaccine or an unknown type of measles vaccine during 1963-1967 should receive 2 doses of MMR vaccine. People who received inactivated mumps vaccine or an unknown type of mumps vaccine before 1979 and are at high risk for mumps infection should consider immunization with 2 doses of MMR vaccine. For females of childbearing age, rubella immunity should be determined. If there is no evidence of immunity, females who are not pregnant should be vaccinated. If there is no evidence of immunity, females who are pregnant should delay immunization until after pregnancy. Unvaccinated health care workers born before [birth year] who lack laboratory evidence of measles, mumps, or rubella immunity or laboratory  confirmation of disease should consider measles and mumps immunization with 2 doses of MMR vaccine or rubella immunization with 1 dose of MMR vaccine.  Pneumococcal 13-valent conjugate (PCV13) vaccine. When indicated, a person who is uncertain of his immunization history and has no record of immunization should receive the PCV13 vaccine. All adults 59 years of age and older should receive this vaccine. An adult aged 74 years or older who has certain medical conditions and has not been previously immunized should receive 1 dose of PCV13 vaccine. This PCV13 should be followed with a dose of pneumococcal polysaccharide (PPSV23) vaccine. Adults who are at high risk for pneumococcal disease should obtain the PPSV23 vaccine at least 8 weeks after the dose of PCV13 vaccine. Adults older than 29 years of age who have normal immune system function should obtain the PPSV23 vaccine dose at least 1 year after the dose of PCV13 vaccine.  Pneumococcal polysaccharide (PPSV23) vaccine. When PCV13 is also indicated, PCV13 should be obtained first. All adults aged 59 years and older should be immunized. An adult younger than age 40 years who has certain medical conditions should be immunized. Any person who resides in a nursing home or long-term care facility should be immunized. An adult smoker should be immunized. People with an immunocompromised condition and certain other conditions should receive both PCV13 and PPSV23 vaccines. People with human immunodeficiency virus (HIV) infection should be immunized as soon as possible after diagnosis. Immunization during chemotherapy or radiation therapy should be avoided. Routine use of PPSV23 vaccine is not recommended for American Indians, [place] Natives, or people younger than 65 years unless there are medical conditions that require PPSV23 vaccine. When indicated, people who have unknown immunization and have no record of immunization should receive PPSV23 vaccine. One-time  revaccination 5 years after the first dose of PPSV23 is recommended for people aged 19-64 years who have chronic kidney failure, nephrotic syndrome, asplenia, or immunocompromised conditions. People who received 1-2 doses of  PPSV23 before age 40 years should receive another dose of PPSV23 vaccine at age 68 years or later if at least 5 years have passed since the previous dose. Doses of PPSV23 are not needed for people immunized with PPSV23 at or after age 5 years.  Meningococcal vaccine. Adults with asplenia or persistent complement component deficiencies should receive 2 doses of quadrivalent meningococcal conjugate (MenACWY-D) vaccine. The doses should be obtained at least 2 months apart. Microbiologists working with certain meningococcal bacteria, [place] recruits, people at risk during an outbreak, and people who travel to or live in countries with a high rate of meningitis should be immunized. A first-year college student up through age 23 years who is living in a residence hall should receive a dose if she did not receive a dose on or after her 16th birthday. Adults who have certain high-risk conditions should receive one or more doses of vaccine.  Hepatitis A vaccine. Adults who wish to be protected from this disease, have certain high-risk conditions, work with hepatitis A-infected animals, work in hepatitis A research labs, or travel to or work in countries with a high rate of hepatitis A should be immunized. Adults who were previously unvaccinated and who anticipate close contact with an international adoptee during the first 60 days after arrival in the [place] from a country with a high rate of hepatitis A should be immunized.  Hepatitis B vaccine. Adults who wish to be protected from this disease, have certain high-risk conditions, may be exposed to blood or other infectious body fluids, are household contacts or sex partners of hepatitis B positive people, are clients or workers in  certain care facilities, or travel to or work in countries with a high rate of hepatitis B should be immunized.  Haemophilus influenzae type b (Hib) vaccine. A previously unvaccinated person with asplenia or sickle cell disease or having a scheduled splenectomy should receive 1 dose of Hib vaccine. Regardless of previous immunization, a recipient of a hematopoietic stem cell transplant should receive a 3-dose series 6-12 months after her successful transplant. Hib vaccine is not recommended for adults with HIV infection. Preventive Services / Frequency Ages 33 to 59 years  Blood pressure check.** / Every 3-5 years.  Lipid and cholesterol check.** / Every 5 years beginning at age 55.  Clinical breast exam.** / Every 3 years for women in their 44s and 27s.  BRCA-related cancer risk assessment.** / For women who have family members with a BRCA-related cancer (breast, ovarian, tubal, or peritoneal cancers).  Pap test.** / Every 2 years from ages 99 through 39. Every 3 years starting at age 61 through age 31 or 58 with a history of 3 consecutive normal Pap tests.  HPV screening.** / Every 3 years from ages 77 through ages 63 to 75 with a history of 3 consecutive normal Pap tests.  Hepatitis C blood test.** / For any individual with known risks for hepatitis C.  Skin self-exam. / Monthly.  Influenza vaccine. / Every year.  Tetanus, diphtheria, and acellular pertussis (Tdap, Td) vaccine.** / Consult your health care provider. Pregnant women should receive 1 dose of Tdap vaccine during each pregnancy. 1 dose of Td every 10 years.  Varicella vaccine.** / Consult your health care provider. Pregnant females who do not have evidence of immunity should receive the first dose after pregnancy.  HPV vaccine. / 3 doses over 6 months, if 46 and younger. The vaccine is not recommended for use in pregnant females. However, pregnancy testing  is not needed before receiving a dose.  Measles, mumps, rubella  (MMR) vaccine.** / You need at least 1 dose of MMR if you were born in 1957 or later. You may also need a 2nd dose. For females of childbearing age, rubella immunity should be determined. If there is no evidence of immunity, females who are not pregnant should be vaccinated. If there is no evidence of immunity, females who are pregnant should delay immunization until after pregnancy.  Pneumococcal 13-valent conjugate (PCV13) vaccine.** / Consult your health care provider.  Pneumococcal polysaccharide (PPSV23) vaccine.** / 1 to 2 doses if you smoke cigarettes or if you have certain conditions.  Meningococcal vaccine.** / 1 dose if you are age 67 to 49 years and a Market researcher living in a residence hall, or have one of several medical conditions, you need to get vaccinated against meningococcal disease. You may also need additional booster doses.  Hepatitis A vaccine.** / Consult your health care provider.  Hepatitis B vaccine.** / Consult your health care provider.  Haemophilus influenzae type b (Hib) vaccine.** / Consult your health care provider. Ages 31 to 57 years  Blood pressure check.** / Every year.  Lipid and cholesterol check.** / Every 5 years beginning at age 28 years.  Lung cancer screening. / Every year if you are aged 43-80 years and have a 30-pack-year history of smoking and currently smoke or have quit within the past 15 years. Yearly screening is stopped once you have quit smoking for at least 15 years or develop a health problem that would prevent you from having lung cancer treatment.  Clinical breast exam.** / Every year after age 51 years.  BRCA-related cancer risk assessment.** / For women who have family members with a BRCA-related cancer (breast, ovarian, tubal, or peritoneal cancers).  Mammogram.** / Every year beginning at age 32 years and continuing for as long as you are in good health. Consult with your health care provider.  Pap test.** / Every 3  years starting at age 72 years through age 16 or 69 years with a history of 3 consecutive normal Pap tests.  HPV screening.** / Every 3 years from ages 22 years through ages 67 to 19 years with a history of 3 consecutive normal Pap tests.  Fecal occult blood test (FOBT) of stool. / Every year beginning at age 64 years and continuing until age 64 years. You may not need to do this test if you get a colonoscopy every 10 years.  Flexible sigmoidoscopy or colonoscopy.** / Every 5 years for a flexible sigmoidoscopy or every 10 years for a colonoscopy beginning at age 73 years and continuing until age 81 years.  Hepatitis C blood test.** / For all people born from [birth year] through 1964 and any individual with known risks for hepatitis C.  Skin self-exam. / Monthly.  Influenza vaccine. / Every year.  Tetanus, diphtheria, and acellular pertussis (Tdap/Td) vaccine.** / Consult your health care provider. Pregnant women should receive 1 dose of Tdap vaccine during each pregnancy. 1 dose of Td every 10 years.  Varicella vaccine.** / Consult your health care provider. Pregnant females who do not have evidence of immunity should receive the first dose after pregnancy.  Zoster vaccine.** / 1 dose for adults aged 24 years or older.  Measles, mumps, rubella (MMR) vaccine.** / You need at least 1 dose of MMR if you were born in 1957 or later. You may also need a second dose. For females of childbearing age, rubella  immunity should be determined. If there is no evidence of immunity, females who are not pregnant should be vaccinated. If there is no evidence of immunity, females who are pregnant should delay immunization until after pregnancy.  Pneumococcal 13-valent conjugate (PCV13) vaccine.** / Consult your health care provider.  Pneumococcal polysaccharide (PPSV23) vaccine.** / 1 to 2 doses if you smoke cigarettes or if you have certain conditions.  Meningococcal vaccine.** / Consult your health care  provider.  Hepatitis A vaccine.** / Consult your health care provider.  Hepatitis B vaccine.** / Consult your health care provider.  Haemophilus influenzae type b (Hib) vaccine.** / Consult your health care provider. Ages 66 years and over  Blood pressure check.** / Every year.  Lipid and cholesterol check.** / Every 5 years beginning at age 26 years.  Lung cancer screening. / Every year if you are aged 7-80 years and have a 30-pack-year history of smoking and currently smoke or have quit within the past 15 years. Yearly screening is stopped once you have quit smoking for at least 15 years or develop a health problem that would prevent you from having lung cancer treatment.  Clinical breast exam.** / Every year after age 19 years.  BRCA-related cancer risk assessment.** / For women who have family members with a BRCA-related cancer (breast, ovarian, tubal, or peritoneal cancers).  Mammogram.** / Every year beginning at age 68 years and continuing for as long as you are in good health. Consult with your health care provider.  Pap test.** / Every 3 years starting at age 36 years through age 10 or 7 years with 3 consecutive normal Pap tests. Testing can be stopped between 65 and 70 years with 3 consecutive normal Pap tests and no abnormal Pap or HPV tests in the past 10 years.  HPV screening.** / Every 3 years from ages 17 years through ages 23 or 48 years with a history of 3 consecutive normal Pap tests. Testing can be stopped between 65 and 70 years with 3 consecutive normal Pap tests and no abnormal Pap or HPV tests in the past 10 years.  Fecal occult blood test (FOBT) of stool. / Every year beginning at age 38 years and continuing until age 89 years. You may not need to do this test if you get a colonoscopy every 10 years.  Flexible sigmoidoscopy or colonoscopy.** / Every 5 years for a flexible sigmoidoscopy or every 10 years for a colonoscopy beginning at age 36 years and continuing  until age 11 years.  Hepatitis C blood test.** / For all people born from [birth year] through 1964 and any individual with known risks for hepatitis C.  Osteoporosis screening.** / A one-time screening for women ages 62 years and over and women at risk for fractures or osteoporosis.  Skin self-exam. / Monthly.  Influenza vaccine. / Every year.  Tetanus, diphtheria, and acellular pertussis (Tdap/Td) vaccine.** / 1 dose of Td every 10 years.  Varicella vaccine.** / Consult your health care provider.  Zoster vaccine.** / 1 dose for adults aged 1 years or older.  Pneumococcal 13-valent conjugate (PCV13) vaccine.** / Consult your health care provider.  Pneumococcal polysaccharide (PPSV23) vaccine.** / 1 dose for all adults aged 74 years and older.  Meningococcal vaccine.** / Consult your health care provider.  Hepatitis A vaccine.** / Consult your health care provider.  Hepatitis B vaccine.** / Consult your health care provider.  Haemophilus influenzae type b (Hib) vaccine.** / Consult your health care provider. ** Family history and personal history  of risk and conditions may change your health care provider's recommendations.   This information is not intended to replace advice given to you by your health care provider. Make sure you discuss any questions you have with your health care provider.   Document Released: 07/10/2001 Document Revised: 06/04/2014 Document Reviewed: 10/09/2010 Elsevier Interactive Patient Education [employer].

## 2015-11-11 NOTE — Assessment & Plan Note (Signed)
Doing very well. Will continue current regimen.

## 2015-11-11 NOTE — Assessment & Plan Note (Signed)
Depression screen negative. Health Maintenance reviewed -- PAP up-to-date. Declines Tetanus today. Preventive schedule discussed and handout given in AVS. Will obtain fasting labs today.

## 2015-11-11 NOTE — Progress Notes (Signed)
Pre visit review using our clinic review tool, if applicable. No additional management support is needed unless otherwise documented below in the visit note/SLS  

## 2015-11-11 NOTE — Progress Notes (Signed)
Patient presents to clinic today for annual exam.  Patient is fasting for labs.  Acute Concerns: Patient c/o weight gain despite significant changes to her diet. Is watching portion sizes and eating very healthy. Staying well hydrated with water. Is eating earlier in the evening. Is exercising at the gym and pool. Does note significant family history of hypothyroidism. Patient endorses being cold-natured with some thinning of hair.   Chronic Issues: Anxiety -- Endorses taking Lexapro 20 mg daily as directed. Is doing very well. Denies anxiety, depressed mood or anhedonia.   Health Maintenance: Immunizations -- Declines tetanus today PAP -- Up to date per patient.  Past Medical History  Diagnosis Date  . Anxiety   . Strep throat   . Chicken pox     Past Surgical History  Procedure Laterality Date  . Cholecystectomy  2013  . Wisdom tooth extraction      Current Outpatient Prescriptions on File Prior to Visit  Medication Sig Dispense Refill  . ALPRAZolam (XANAX) 0.5 MG tablet Take 1 tablet (0.5 mg total) by mouth 2 (two) times daily as needed for anxiety. 30 tablet 2  . Calcium Carb-Cholecalciferol (CALCIUM-VITAMIN D) 600-400 MG-UNIT TABS Take 1 each by mouth daily.    Marland Kitchen escitalopram (LEXAPRO) 20 MG tablet Take 1 tablet (20 mg total) by mouth daily. 90 tablet 1  . medroxyPROGESTERone (DEPO-PROVERA) 150 MG/ML injection Inject 150 mg into the muscle every 3 (three) months.    . Multiple Vitamin (MULTIVITAMIN) tablet Take 1 tablet by mouth daily.     No current facility-administered medications on file prior to visit.    No Known Allergies  Family History  Problem Relation Age of Onset  . Hypertension Mother   . Arthritis Maternal Grandmother   . Hyperlipidemia Maternal Grandmother   . Hypertension Maternal Grandmother   . Throat cancer Maternal Grandfather   . Stroke Maternal Aunt   . Healthy Sister     x1  . Healthy Daughter     x1    Social History   Social  History  . Marital Status: Single    Spouse Name: N/A  . Number of Children: N/A  . Years of Education: N/A   Occupational History  . Not on file.   Social History Main Topics  . Smoking status: Former Smoker    Quit date: 07/27/2012  . Smokeless tobacco: Never Used  . Alcohol Use: No  . Drug Use: No  . Sexual Activity: Yes    Birth Control/ Protection: None     Comment: men   Other Topics Concern  . Not on file   Social History Narrative   Review of Systems  Constitutional: Negative for fever and weight loss.  HENT: Negative for ear discharge, ear pain, hearing loss and tinnitus.   Eyes: Negative for blurred vision, double vision, photophobia and pain.  Respiratory: Negative for cough and shortness of breath.   Cardiovascular: Negative for chest pain and palpitations.  Gastrointestinal: Negative for heartburn, nausea, vomiting, abdominal pain, diarrhea, constipation, blood in stool and melena.  Genitourinary: Negative for dysuria, urgency, frequency, hematuria and flank pain.  Musculoskeletal: Negative for falls.  Neurological: Negative for dizziness, loss of consciousness and headaches.  Endo/Heme/Allergies: Negative for environmental allergies.  Psychiatric/Behavioral: Negative for depression, suicidal ideas, hallucinations and substance abuse. The patient is not nervous/anxious and does not have insomnia.    BP 115/67 mmHg  Pulse 85  Temp(Src) 98.2 F (36.8 C) (Oral)  Resp 16  Ht 5'  11" (1.803 m)  Wt 227 lb 4 oz (103.08 kg)  BMI 31.71 kg/m2  SpO2 99%  Physical Exam  Constitutional: She is oriented to person, place, and time and well-developed, well-nourished, and in no distress.  HENT:  Head: Normocephalic and atraumatic.  Right Ear: Tympanic membrane, external ear and ear canal normal.  Left Ear: Tympanic membrane, external ear and ear canal normal.  Nose: Nose normal. No mucosal edema.  Mouth/Throat: Uvula is midline, oropharynx is clear and moist and  mucous membranes are normal. No oropharyngeal exudate or posterior oropharyngeal erythema.  Eyes: Conjunctivae are normal. Pupils are equal, round, and reactive to light.  Neck: Neck supple. No thyromegaly present.  Cardiovascular: Normal rate, regular rhythm, normal heart sounds and intact distal pulses.   Pulmonary/Chest: Effort normal and breath sounds normal. No respiratory distress. She has no wheezes. She has no rales.  Abdominal: Soft. Bowel sounds are normal. She exhibits no distension and no mass. There is no tenderness. There is no rebound and no guarding.  Lymphadenopathy:    She has no cervical adenopathy.  Neurological: She is alert and oriented to person, place, and time. No cranial nerve deficit.  Skin: Skin is warm and dry. No rash noted.  Psychiatric: Affect normal.  Vitals reviewed.  No results found for this or any previous visit (from the past 2160 hour(s)).  Assessment/Plan: Visit for preventive health examination Depression screen negative. Health Maintenance reviewed -- PAP up-to-date. Declines Tetanus today. Preventive schedule discussed and handout given in AVS. Will obtain fasting labs today.   Anxiety Doing very well. Will continue current regimen.  Weight gain Diet and exercise recommendations reviewed. Will check thyroid panel. If normal will consider nutritionist and pharmacotherapy.   Leeanne Rio, PA-C

## 2015-11-11 NOTE — Assessment & Plan Note (Signed)
Diet and exercise recommendations reviewed. Will check thyroid panel. If normal will consider nutritionist and pharmacotherapy.

## 2015-11-15 ENCOUNTER — Telehealth: Payer: Self-pay | Admitting: Physician Assistant

## 2015-11-15 ENCOUNTER — Encounter: Payer: Self-pay | Admitting: Infectious Diseases

## 2015-11-15 ENCOUNTER — Ambulatory Visit (INDEPENDENT_AMBULATORY_CARE_PROVIDER_SITE_OTHER): Payer: BLUE CROSS/BLUE SHIELD | Admitting: Infectious Diseases

## 2015-11-15 VITALS — BP 110/69 | HR 61 | Temp 98.2°F | Wt 228.0 lb

## 2015-11-15 DIAGNOSIS — R21 Rash and other nonspecific skin eruption: Secondary | ICD-10-CM | POA: Insufficient documentation

## 2015-11-15 MED ORDER — VALACYCLOVIR HCL 1 G PO TABS: 1000.0000 mg | ORAL_TABLET | Freq: Three times a day (TID) | ORAL | Status: DC

## 2015-11-15 NOTE — Telephone Encounter (Signed)
Relation to [id number] Call back number:[phone] Pharmacy:  Reason for call:  Patient lab results released on my chart patient in need of clarification.please advise

## 2015-11-15 NOTE — Telephone Encounter (Signed)
LMOM [2nd] with contact name and number for return call RE: results and further provider instructions [if Im not available, ask to speak with clinical nurse]/SLS 06/20 See result note.

## 2015-11-15 NOTE — Assessment & Plan Note (Signed)
We spoke that the best course of action would be for her to have lesions swabbed and sent for VZV PCR at her next outbreak.  Will give her vatlrex at that point (if lesions consistent).  She is not a candidate for the varicella vaccine (at least 29 yo) I would not give her suppressive therapy at this point.  She needs a HIV test- she is scheduled to have this done with her upcoming insurance physical.

## 2015-11-15 NOTE — Progress Notes (Signed)
   Subjective:    Patient ID: Olivia Reyes, female    DOB: 19-Mar-1986, 30 y.o.   MRN: [id number]  HPI 30 yo F with 3 epsiodes of shingles. First was 1.5 yrs ago. She was told it was a bug bite. 1 month later she had repeat episode in same place, she received acyclovir. 2-3 months ago she had repeat episode- in same area (slightly higher). No f/c.  Large whelt with blisters on top, smaller blisters around.  No prodrome. occas has pain in same areas, tingling.   Has not had shingles vax. Had chickenpox when she was ? No hx of childhood pneumonias, sin infections, dental problems.   The past medical history, family history and social history were reviewed/updated in EPIC Trying to lose wt, eating less, going to gym.   Review of Systems  Constitutional: Negative for fever, chills, appetite change and unexpected weight change.  Eyes: Negative for visual disturbance.  Gastrointestinal: Negative for diarrhea and constipation.  Genitourinary: Negative for difficulty urinating.  Skin: Positive for rash.       Objective:   Physical Exam  Constitutional: She appears well-developed and well-nourished.  HENT:  Mouth/Throat: No oropharyngeal exudate.  Eyes: EOM are normal. Pupils are equal, round, and reactive to light.  Neck: Neck supple.  Cardiovascular: Normal rate, regular rhythm and normal heart sounds.   Pulmonary/Chest: Effort normal and breath sounds normal.  Abdominal: Soft. Bowel sounds are normal. There is no tenderness. There is no rebound.  Musculoskeletal: She exhibits edema.  Lymphadenopathy:    She has no cervical adenopathy.  Skin:          Assessment & Plan:

## 2015-11-25 ENCOUNTER — Encounter: Payer: Self-pay | Admitting: *Deleted

## 2015-12-16 ENCOUNTER — Encounter: Payer: Self-pay | Admitting: Physician Assistant

## 2015-12-16 DIAGNOSIS — R8290 Unspecified abnormal findings in urine: Secondary | ICD-10-CM

## 2015-12-19 MED ORDER — PHENTERMINE HCL 37.5 MG PO CAPS: 37.5000 mg | ORAL_CAPSULE | ORAL | 1 refills | Status: DC

## 2015-12-20 DIAGNOSIS — Z3042 Encounter for surveillance of injectable contraceptive: Secondary | ICD-10-CM | POA: Diagnosis not present

## 2016-01-25 ENCOUNTER — Ambulatory Visit: Payer: Self-pay | Admitting: Physician Assistant

## 2016-02-13 ENCOUNTER — Encounter: Payer: Self-pay | Admitting: Physician Assistant

## 2016-02-13 ENCOUNTER — Ambulatory Visit (INDEPENDENT_AMBULATORY_CARE_PROVIDER_SITE_OTHER): Payer: BLUE CROSS/BLUE SHIELD | Admitting: Physician Assistant

## 2016-02-13 DIAGNOSIS — Z23 Encounter for immunization: Secondary | ICD-10-CM

## 2016-02-13 DIAGNOSIS — E669 Obesity, unspecified: Secondary | ICD-10-CM

## 2016-02-13 MED ORDER — ALPRAZOLAM 0.5 MG PO TABS: 0.5000 mg | ORAL_TABLET | Freq: Two times a day (BID) | ORAL | 2 refills | Status: DC | PRN

## 2016-02-13 MED ORDER — PHENTERMINE HCL 37.5 MG PO CAPS: 37.5000 mg | ORAL_CAPSULE | ORAL | 1 refills | Status: DC

## 2016-02-13 NOTE — Progress Notes (Signed)
Pre visit review using our clinic review tool, if applicable. No additional management support is needed unless otherwise documented below in the visit note/SLS  

## 2016-02-13 NOTE — Progress Notes (Signed)
   Patient presents to clinic today for follow-up of obesity after starting phentermine 37.5 mg daily. Is taking medication as directed. Tolerating well without side effect. Has lost about 6 pound since starting. Is continuing portion control, increased H20 intake and avoidance of fried/fatty foods. Is going to the gym but has cut back some due to having to carry her girls to softball practice/games.  Past Medical History:  Diagnosis Date  . Anxiety   . Chicken pox   . Strep throat     Current Outpatient Prescriptions on File Prior to Visit  Medication Sig Dispense Refill  . Calcium Carb-Cholecalciferol (CALCIUM-VITAMIN D) 600-400 MG-UNIT TABS Take 1 each by mouth daily.    Marland Kitchen escitalopram (LEXAPRO) 20 MG tablet Take 1 tablet (20 mg total) by mouth daily. 90 tablet 1  . medroxyPROGESTERone (DEPO-PROVERA) 150 MG/ML injection Inject 150 mg into the muscle every 3 (three) months.    . Multiple Vitamin (MULTIVITAMIN) tablet Take 1 tablet by mouth daily.    . valACYclovir (VALTREX) 1000 MG tablet Take 1 tablet (1,000 mg total) by mouth 3 (three) times daily. (Patient taking differently: Take 1,000 mg by mouth as needed. ) 21 tablet 0   No current facility-administered medications on file prior to visit.     No Known Allergies  Family History  Problem Relation Age of Onset  . Hypertension Mother   . Arthritis Maternal Grandmother   . Hyperlipidemia Maternal Grandmother   . Hypertension Maternal Grandmother   . Throat cancer Maternal Grandfather   . Stroke Maternal Aunt   . Healthy Sister     x1  . Healthy Daughter     x1    Social History   Social History  . Marital status: Single    Spouse name: N/A  . Number of children: N/A  . Years of education: N/A   Social History Main Topics  . Smoking status: Former Smoker    Quit date: 07/27/2012  . Smokeless tobacco: Never Used  . Alcohol use 0.6 oz/week    1 Standard drinks or equivalent per week  . Drug use: No  . Sexual  activity: Yes    Birth control/ protection: None     Comment: men   Other Topics Concern  . None   Social History Narrative  . None    Review of Systems - See HPI.  All other ROS are negative.  BP (!) 108/58 (BP Location: Left Arm, Patient Position: Sitting, Cuff Size: Normal)   Pulse 81   Temp 98.3 F (36.8 C) (Oral)   Resp 16   Ht 5\' 11"  (1.803 m)   Wt 223 lb (101.2 kg)   SpO2 100%   BMI 31.10 kg/m   Physical Exam  Constitutional: She is oriented to person, place, and time and well-developed, well-nourished, and in no distress.  HENT:  Head: Normocephalic and atraumatic.  Cardiovascular: Normal rate, regular rhythm, normal heart sounds and intact distal pulses.   Pulmonary/Chest: Effort normal and breath sounds normal. No respiratory distress. She has no wheezes. She has no rales. She exhibits no tenderness.  Neurological: She is alert and oriented to person, place, and time.  Skin: Skin is warm and dry. No rash noted.  Psychiatric: Affect normal.  Vitals reviewed.  Assessment/Plan: Obesity Vitals stable. Is compliant with diet and exercise recommendations. Taking phentermine without side effects. Will continue for 2 more months. Patient to FU at that time.    Leeanne Rio, PA-C

## 2016-02-13 NOTE — Patient Instructions (Signed)
Please keep up with diet and exercise. Continue Phentermine as directed for 2 more months.  Follow-up after completing last prescription.  I am glad you are doing well!

## 2016-02-13 NOTE — Assessment & Plan Note (Signed)
Vitals stable. Is compliant with diet and exercise recommendations. Taking phentermine without side effects. Will continue for 2 more months. Patient to FU at that time.

## 2016-02-27 DIAGNOSIS — M9901 Segmental and somatic dysfunction of cervical region: Secondary | ICD-10-CM | POA: Diagnosis not present

## 2016-02-27 DIAGNOSIS — M9902 Segmental and somatic dysfunction of thoracic region: Secondary | ICD-10-CM | POA: Diagnosis not present

## 2016-02-27 DIAGNOSIS — M9904 Segmental and somatic dysfunction of sacral region: Secondary | ICD-10-CM | POA: Diagnosis not present

## 2016-03-05 DIAGNOSIS — Z3042 Encounter for surveillance of injectable contraceptive: Secondary | ICD-10-CM | POA: Diagnosis not present

## 2016-05-14 ENCOUNTER — Telehealth: Payer: Self-pay | Admitting: Physician Assistant

## 2016-05-14 ENCOUNTER — Other Ambulatory Visit: Payer: Self-pay | Admitting: Physician Assistant

## 2016-05-14 NOTE — Telephone Encounter (Signed)
°  Relation to [id number] Call back [phone] Pharmacy: [id number] - [place], [place] [phone] (Phone) [phone] (Fax)     Reason for call:  Patient states she tried to pick up ALPRAZolam Duanne Moron) 0.5 MG tablet from the pharmacy and was advised Rx expired, patient is going out of town Wednesday, requesting Rx, please advise

## 2016-05-14 NOTE — Telephone Encounter (Signed)
Xanax 0.5 mg bid prn Last filled 02/13/16 # 30 2 RF Last ov: 02/13/16 No CSC or UDS Please advise

## 2016-05-15 ENCOUNTER — Other Ambulatory Visit: Payer: Self-pay | Admitting: Physician Assistant

## 2016-05-15 ENCOUNTER — Encounter: Payer: Self-pay | Admitting: Emergency Medicine

## 2016-05-15 NOTE — Telephone Encounter (Signed)
Last fill per database was 02/13/16. Ok to refill. Will print. She will need to sign a CSC before further refills will be given.

## 2016-05-15 NOTE — Telephone Encounter (Signed)
Left a detailed message on patient voicemail that the rx for the Xanax was ready for pick up at [facility] location. She needed to sign a CSC when picking up the rx.

## 2016-05-15 NOTE — Telephone Encounter (Signed)
Left detailed message on voicemail advising patient rx for Xanax was ready for pick up at our [place] location. She needed to sign a new CSC when picking up rx.

## 2016-05-16 NOTE — Telephone Encounter (Signed)
Patient signed Olivia Reyes thru mychart and faxed to the office. She wanted rx faxed to the pharmacy. [facility].

## 2016-05-30 ENCOUNTER — Encounter: Payer: Self-pay | Admitting: Physician Assistant

## 2016-05-30 ENCOUNTER — Ambulatory Visit (INDEPENDENT_AMBULATORY_CARE_PROVIDER_SITE_OTHER): Payer: BLUE CROSS/BLUE SHIELD | Admitting: Physician Assistant

## 2016-05-30 VITALS — BP 107/64 | HR 87 | Temp 98.6°F | Resp 20 | Ht 71.0 in | Wt 229.8 lb

## 2016-05-30 DIAGNOSIS — R059 Cough, unspecified: Secondary | ICD-10-CM

## 2016-05-30 DIAGNOSIS — J069 Acute upper respiratory infection, unspecified: Secondary | ICD-10-CM

## 2016-05-30 DIAGNOSIS — R05 Cough: Secondary | ICD-10-CM

## 2016-05-30 MED ORDER — PSEUDOEPH-BROMPHEN-DM 30-2-10 MG/5ML PO SYRP: 2.5000 mL | ORAL_SOLUTION | Freq: Four times a day (QID) | ORAL | 0 refills | Status: DC | PRN

## 2016-05-30 MED ORDER — AMOXICILLIN-POT CLAVULANATE 875-125 MG PO TABS: 1.0000 | ORAL_TABLET | Freq: Two times a day (BID) | ORAL | 0 refills | Status: DC

## 2016-05-30 NOTE — Progress Notes (Signed)
Pre visit review using our clinic review tool, if applicable. No additional management support is needed unless otherwise documented below in the visit note. 

## 2016-05-30 NOTE — Progress Notes (Addendum)
Subjective:    Patient ID: Olivia Reyes, female    DOB: Apr 09, 1986, 31 y.o.   MRN: [id number]  HPI   Olivia Reyes is a 31 y/o female patient who reports cough since Tuesday of last week (8 days ago.) She has had some chills and sweats, with subjective fever, she does not own a thermometer. She has post nasal drip and some dry cough, mostly dry but productive at times. She is starting to have some left ear ache. She is currently taking Tylenol and Mucinex for her symptoms. Started Sudafed as well.  Denies SOB.  Received flu shot this year. Endorses over all good appetite. Former smoker, hasn't smoked in years.  She also reports that she is not using the phentermine that is prescribed for her.   Review of Systems  See HPI.  Past Medical History:  Diagnosis Date  . Anxiety   . Chicken pox   . Strep throat      Social History   Social History  . Marital status: Single    Spouse name: N/A  . Number of children: N/A  . Years of education: N/A   Occupational History  . Not on file.   Social History Main Topics  . Smoking status: Former Smoker    Quit date: 07/27/2012  . Smokeless tobacco: Never Used  . Alcohol use 0.6 oz/week    1 Standard drinks or equivalent per week  . Drug use: No  . Sexual activity: Yes    Birth control/ protection: None     Comment: men   Other Topics Concern  . Not on file   Social History Narrative  . No narrative on file    Past Surgical History:  Procedure Laterality Date  . CHOLECYSTECTOMY  2013  . WISDOM TOOTH EXTRACTION      Family History  Problem Relation Age of Onset  . Hypertension Mother   . Arthritis Maternal Grandmother   . Hyperlipidemia Maternal Grandmother   . Hypertension Maternal Grandmother   . Throat cancer Maternal Grandfather   . Stroke Maternal Aunt   . Healthy Sister     x1  . Healthy Daughter     x1    No Known Allergies  Current Outpatient Prescriptions on File Prior to Visit  Medication Sig Dispense  Refill  . ALPRAZolam (XANAX) 0.5 MG tablet TAKE 1 TABLET BY MOUTH TWICE A DAY AS NEEDED FOR ANXIETY 30 tablet 0  . Calcium Carb-Cholecalciferol (CALCIUM-VITAMIN D) 600-400 MG-UNIT TABS Take 1 each by mouth daily.    Marland Kitchen escitalopram (LEXAPRO) 20 MG tablet Take 1 tablet (20 mg total) by mouth daily. 90 tablet 1  . medroxyPROGESTERone (DEPO-PROVERA) 150 MG/ML injection Inject 150 mg into the muscle every 3 (three) months.    . Multiple Vitamin (MULTIVITAMIN) tablet Take 1 tablet by mouth daily.     No current facility-administered medications on file prior to visit.     BP 107/64 (BP Location: Left Arm, Cuff Size: Large)   Pulse 87   Temp 98.6 F (37 C) (Oral)   Resp 20   Ht 5\' 11"  (1.803 m)   Wt 229 lb 12.8 oz (104.2 kg)   SpO2 100% Comment: room air  BMI 32.05 kg/m       Objective:   Physical Exam  Constitutional: Vital signs are normal. She appears well-developed and well-nourished.  HENT:  Head: Normocephalic and atraumatic.  Right Ear: Tympanic membrane, external ear and ear canal normal.  Left Ear: Tympanic membrane,  external ear and ear canal normal.  Nose: Right sinus exhibits maxillary sinus tenderness. Left sinus exhibits maxillary sinus tenderness.  Mouth/Throat: Mucous membranes are normal. Posterior oropharyngeal edema and posterior oropharyngeal erythema present. No oropharyngeal exudate.  Tonsillar swelling 2+ bilaterally no exudates  Eyes: Conjunctivae are normal.  Pulmonary/Chest: Effort normal and breath sounds normal.  No crackles or wheezes throughout both lung fields.  Lymphadenopathy:    She has no cervical adenopathy.  Neurological: She is alert.  Skin: Skin is warm and dry.  Nursing note and vitals reviewed.     Assessment & Plan:  Upper respiratory infection - suspect bacterial, treat with Augmentin per orders. Patient reports that [employer] have not helped her in past. Offered Bromfed, patient agreeable to trying. May take guaifenesin (plain  Mucinex), but do not need to take anymore Sudafed while using Bromfed, discussed with patient. Encouraged rest and hydration. Follow-up with PCP for further evaluation if symptoms do not improve or worsen. Patient agreeable to plan.  Inda Coke PA-C

## 2016-05-30 NOTE — Patient Instructions (Addendum)
It was nice meeting you today!  Start taking the oral antibiotic as prescribed. Take with food.  Use the cough syrup as prescribed. You may take regular Mucinex with this. You may stop the Sudafed while using the cough syrup.  Follow-up with your PCP if your symptoms do not improve or new/worsening symptoms develop.

## 2016-07-04 DIAGNOSIS — Z3042 Encounter for surveillance of injectable contraceptive: Secondary | ICD-10-CM | POA: Diagnosis not present

## 2016-07-04 DIAGNOSIS — Z3202 Encounter for pregnancy test, result negative: Secondary | ICD-10-CM | POA: Diagnosis not present

## 2016-07-09 ENCOUNTER — Other Ambulatory Visit: Payer: Self-pay | Admitting: Physician Assistant

## 2016-07-10 NOTE — Telephone Encounter (Signed)
Xanax last filled 05/15/16 #30 CSC signed 05/15/16 Please advise

## 2016-08-07 ENCOUNTER — Ambulatory Visit: Payer: BLUE CROSS/BLUE SHIELD | Admitting: Medical

## 2016-08-14 ENCOUNTER — Encounter: Payer: Self-pay | Admitting: Podiatry

## 2016-08-14 ENCOUNTER — Ambulatory Visit (INDEPENDENT_AMBULATORY_CARE_PROVIDER_SITE_OTHER): Payer: BLUE CROSS/BLUE SHIELD | Admitting: Podiatry

## 2016-08-14 VITALS — BP 109/59 | HR 52 | Resp 18

## 2016-08-14 DIAGNOSIS — B351 Tinea unguium: Secondary | ICD-10-CM | POA: Diagnosis not present

## 2016-08-14 DIAGNOSIS — L603 Nail dystrophy: Secondary | ICD-10-CM | POA: Diagnosis not present

## 2016-08-14 DIAGNOSIS — L609 Nail disorder, unspecified: Secondary | ICD-10-CM | POA: Diagnosis not present

## 2016-08-14 NOTE — Patient Instructions (Signed)

## 2016-08-14 NOTE — Progress Notes (Signed)
   Subjective:    Patient ID: Olivia Reyes, female    DOB: July 02, 1985, 31 y.o.   MRN: [id number]  HPI  31 year old female presents the office they for concerns of toenail discoloration mostly to her left big toe. She states that she presented ingrown toenail but she cut her nail has not been sore. She is denying any redness or drainage or any plastic currently not having any pain but she is concerned with color to her toenails and possible nail fungus. She's had no recent treatment of the discoloration to her nails. She has no other complaints today.  Review of Systems  All other systems reviewed and are negative.      Objective:   Physical Exam General: AAO x3, NAD  Dermatological: Nails most notable in the left and right hallux toenail appears to be dystrophic with mild hypertrophy as well as yellow to brown discoloration to the toenail. There is no swelling erythema. Mild incurvation of both the medial and lateral nail borders of left hallux. The no drainage or pus. There is no clinical signs of infection. No open lesions identified.  Vascular: Dorsalis Pedis artery and Posterior Tibial artery pedal pulses are 2/4 bilateral with immedate capillary fill time.There is no pain with calf compression, swelling, warmth, erythema.   Neruologic: Grossly intact via light touch bilateral. Vibratory intact via tuning fork bilateral. Protective threshold with Semmes Wienstein monofilament intact to all pedal sites bilateral.   Musculoskeletal: No gross boney pedal deformities bilateral. No pain, crepitus, or limitation noted with foot and ankle range of motion bilateral. Muscular strength 5/5 in all groups tested bilateral.  Gait: Unassisted, Nonantalgic.      Assessment & Plan:  31 year old female onychodystrophy likely onychomycosis with history of ingrown toenail currently a symptomatic -Treatment options discussed including all alternatives, risks, and complications -Etiology of symptoms were  discussed -At this time as her toenails a symptomatic in regards ingrown toenail she wishes to hold off an ingrown toenail procedure. I did debrided toenails this symptom for culture/pathology to evaluate for onychomycosis. I discussed the treatment options and she will likely undergo Lamisil.  Celesta Gentile, DPM

## 2016-08-22 ENCOUNTER — Encounter: Payer: Self-pay | Admitting: Physician Assistant

## 2016-08-28 ENCOUNTER — Telehealth: Payer: Self-pay | Admitting: Physician Assistant

## 2016-08-28 NOTE — Telephone Encounter (Signed)
Ok with me 

## 2016-08-28 NOTE — Telephone Encounter (Signed)
ok 

## 2016-08-28 NOTE — Telephone Encounter (Signed)
Patient request to transfer care from [facility] to Dr. Copland °

## 2016-09-03 ENCOUNTER — Encounter: Payer: Self-pay | Admitting: Podiatry

## 2016-09-04 ENCOUNTER — Telehealth: Payer: Self-pay | Admitting: *Deleted

## 2016-09-04 NOTE — Telephone Encounter (Addendum)
-----   Message from Trula Slade, DPM sent at 09/04/2016  2:20 PM EDT ----- Negative for fungus. Can try a urea cream. Please let her know. Thanks. Left message with Dr. Leigh Aurora review of results and recommendation for Revitaderm40 urea cream and to call with concerns.

## 2016-09-10 ENCOUNTER — Ambulatory Visit (INDEPENDENT_AMBULATORY_CARE_PROVIDER_SITE_OTHER): Payer: BLUE CROSS/BLUE SHIELD | Admitting: Family Medicine

## 2016-09-10 VITALS — BP 104/62 | HR 82 | Temp 98.6°F | Ht 71.0 in | Wt 232.4 lb

## 2016-09-10 DIAGNOSIS — F419 Anxiety disorder, unspecified: Secondary | ICD-10-CM

## 2016-09-10 DIAGNOSIS — Z638 Other specified problems related to primary support group: Secondary | ICD-10-CM

## 2016-09-10 DIAGNOSIS — F329 Major depressive disorder, single episode, unspecified: Secondary | ICD-10-CM | POA: Diagnosis not present

## 2016-09-10 DIAGNOSIS — F32A Depression, unspecified: Secondary | ICD-10-CM

## 2016-09-10 MED ORDER — FLUOXETINE HCL 20 MG PO TABS: 20.0000 mg | ORAL_TABLET | Freq: Every day | ORAL | 3 refills | Status: DC

## 2016-09-10 NOTE — Patient Instructions (Signed)
Please cut your lexapro down to 10 mg for 5 days- then start on 20 mg of prozac. After 1-2 weeks increase your prozac to 40 mg Please keep me posted via mychart about your progress. Please send me a message in about 2 weeks- sooner if you are not doing ok!!    Please see me in about 2 months   Info about [facility]-  When it comes to your psychological well-being, our experienced staff of psychologists and clinical social workers are available to provide comprehensive counseling services. We offer services to individuals and families from age 26 and older. [facility]'s Division of Behavioral Medicine utilizes best practices in the treatment of many psychiatric conditions. Serving the [place] and surrounding communities, our approach to mental health care is problem-specific and patient-centered. Treatment is focused on the area of concern through evaluation, therapy, education, and support. Together we will develop a treatment plan to best meet your mental, emotional, and physiological needs.  Research supports the use of counseling and medication to offer the most effective results. Our therapists will help you evaluate your need for medication and refer you to a primary care physician or psychiatrist when appropriate. [place] clinician can equip you with more information about our services and can answer your questions about counseling. To find a behavioral medicine provider near you, please visit our Locations page. If you are interested in scheduling an appointment, please call [phone].

## 2016-09-10 NOTE — Progress Notes (Signed)
[place] at [facility] [address] [place], [place] [address] [phone]  Date:  09/10/2016   Name:  Olivia Reyes   DOB:  1985/11/01   MRN:  [id number]  PCP:  Lamar Blinks, MD    Chief Complaint: Transfer of Care (Former pt of [place]. Would like to disucss other options for Lexapro. Pt states that she started out on 10 MG and was changed to 20 MG a few months ago and this medicaiton is just not doing much )   History of Present Illness:  Olivia Reyes is a 31 y.o. very pleasant female patient who presents with the following:  Here today to establish care- transfer from [place], [place] who has moved to a new location She was seen last summer with concern of obesity  She goes to Dr. Helane Rima for her pap/well woman care- she will plan to see them soon for follow-up  States that she would most like to discuss anxiety and depression today. She used to see a Engineer, production healer.  She started on lexapro a few years ago.  This physician then left the practice.  Cody increased her lexapro dose several months ago and added prn xanax for panic.  At first the increased dose did seem to help her,but over the last couple of months she has noted more symptoms again  She has been on lexapro for about 2 years overall.  Her dose has increased from 5 to 20 mg over the last couple of years.  With each increase she did better for a time but then sx would return  She would like to get back into seeing a counselor.  Wonders about being "tested for depression." She notes that depression and anxiety run in her family.  She often feels unmotivated, feels very tired a lot of of the time.  Little things will effect her more than she would expect, she may become tearful   She is anxious about 85% of the time, feels like she is less interested in things less than she used to be.  . Sleeping well- too much.  appetite is ok  She works in a Museum/gallery curator.    She lives with her BF, she had a daughter who is 96, and a step -daughter (BF's child) who is 80 yo.   Things are "good at home,"  But her daughter does struggle with ADHD.   She feels like things are good at her job and with her BF. He is supportive of her.   She stays busy with her children, and enjoys riding motorcycles with her BF as well.  She attributes a lot of her anxiety to her relationship with her mother-  About 6 years ago her step brother died, but she denies other major traumas in her life.    She is using her xanax prn for panic- may take it once a week or so.  Sometimes more.   Denies any SI She is interested in getting a new counselor and I will give her this info  Wt Readings from Last 3 Encounters:  09/10/16 232 lb 6.4 oz (105.4 kg)  05/30/16 229 lb 12.8 oz (104.2 kg)  02/13/16 223 lb (101.2 kg)     Patient Active Problem List   Diagnosis Date Noted  . Rash and nonspecific skin eruption 11/15/2015  . Visit for preventive health examination 11/11/2015  . Obesity 11/11/2015  . Anxiety 07/27/2013  . Abnormal Pap  smear of cervix 07/27/2013    Past Medical History:  Diagnosis Date  . Anxiety   . Chicken pox   . Strep throat     Past Surgical History:  Procedure Laterality Date  . CHOLECYSTECTOMY  2013  . WISDOM TOOTH EXTRACTION      Social History  Substance Use Topics  . Smoking status: Former Smoker    Quit date: 07/27/2012  . Smokeless tobacco: Never Used  . Alcohol use 0.6 oz/week    1 Standard drinks or equivalent per week    Family History  Problem Relation Age of Onset  . Hypertension Mother   . Arthritis Maternal Grandmother   . Hyperlipidemia Maternal Grandmother   . Hypertension Maternal Grandmother   . Throat cancer Maternal Grandfather   . Stroke Maternal Aunt   . Healthy Sister     x1  . Healthy Daughter     x1    No Known Allergies  Medication list has been reviewed and updated.  Current Outpatient Prescriptions on File  Prior to Visit  Medication Sig Dispense Refill  . ALPRAZolam (XANAX) 0.5 MG tablet TAKE 1 TABLET BY MOUTH TWICE A DAY AS NEEDED FOR ANXIETY 30 tablet 0  . Calcium Carb-Cholecalciferol (CALCIUM-VITAMIN D) 600-400 MG-UNIT TABS Take 1 each by mouth daily.    Marland Kitchen escitalopram (LEXAPRO) 20 MG tablet Take 1 tablet (20 mg total) by mouth daily. 90 tablet 1  . medroxyPROGESTERone (DEPO-PROVERA) 150 MG/ML injection Inject 150 mg into the muscle every 3 (three) months.    . Multiple Vitamin (MULTIVITAMIN) tablet Take 1 tablet by mouth daily.     No current facility-administered medications on file prior to visit.     Review of Systems:  As per HPI- otherwise negative.   Physical Examination: Vitals:   09/10/16 1007  BP: 104/62  Pulse: 82  Temp: 98.6 F (37 C)   Vitals:   09/10/16 1007  Weight: 232 lb 6.4 oz (105.4 kg)  Height: 5\' 11"  (1.803 m)   Body mass index is 32.41 kg/m. Ideal Body Weight: Weight in (lb) to have BMI = 25: 178.9  GEN: WDWN, NAD, Non-toxic, A & O x 3, overweight, looks well but sometimes tearful during encounter HEENT: Atraumatic, Normocephalic. Neck supple. No masses, No LAD. Ears and Nose: No external deformity. CV: RRR, No M/G/R. No JVD. No thrill. No extra heart sounds. PULM: CTA B, no wheezes, crackles, rhonchi. No retractions. No resp. distress. No accessory muscle use. ABD: S, NT, ND, +BS. No rebound. No HSM. EXTR: No c/c/e NEURO Normal gait.  PSYCH: Normally interactive. Conversant. .  Calm demeanor.    Assessment and Plan: Anxiety and depression  Stress due to family tension  Here today to establish care and discuss anxiety/ depression. Will change her zoloft to prozac, and she will let me know how this works for her. Also gave info about [place] counseling program She will update me via mychart within 2 weeks and plan to visit here in 2 months- Sooner if worse.    See patient instructions for more details.      Signed Lamar Blinks, MD

## 2016-09-12 ENCOUNTER — Other Ambulatory Visit: Payer: Self-pay | Admitting: Physician Assistant

## 2016-09-12 ENCOUNTER — Encounter: Payer: Self-pay | Admitting: Family Medicine

## 2016-09-12 MED ORDER — FLUOXETINE HCL 20 MG PO CAPS: 20.0000 mg | ORAL_CAPSULE | Freq: Every day | ORAL | 6 refills | Status: DC

## 2016-09-20 DIAGNOSIS — Z3042 Encounter for surveillance of injectable contraceptive: Secondary | ICD-10-CM | POA: Diagnosis not present

## 2016-11-08 ENCOUNTER — Ambulatory Visit (INDEPENDENT_AMBULATORY_CARE_PROVIDER_SITE_OTHER): Payer: BLUE CROSS/BLUE SHIELD | Admitting: Family Medicine

## 2016-11-08 ENCOUNTER — Encounter: Payer: Self-pay | Admitting: Family Medicine

## 2016-11-08 VITALS — BP 106/82 | HR 60 | Temp 98.1°F | Ht 71.0 in | Wt 228.8 lb

## 2016-11-08 DIAGNOSIS — Z13220 Encounter for screening for lipoid disorders: Secondary | ICD-10-CM | POA: Diagnosis not present

## 2016-11-08 DIAGNOSIS — F419 Anxiety disorder, unspecified: Secondary | ICD-10-CM

## 2016-11-08 DIAGNOSIS — R0683 Snoring: Secondary | ICD-10-CM

## 2016-11-08 DIAGNOSIS — F32A Depression, unspecified: Secondary | ICD-10-CM

## 2016-11-08 DIAGNOSIS — Z130 Encounter for screening for diseases of the blood and blood-forming organs and certain disorders involving the immune mechanism: Secondary | ICD-10-CM | POA: Diagnosis not present

## 2016-11-08 DIAGNOSIS — R5382 Chronic fatigue, unspecified: Secondary | ICD-10-CM | POA: Diagnosis not present

## 2016-11-08 DIAGNOSIS — Z131 Encounter for screening for diabetes mellitus: Secondary | ICD-10-CM

## 2016-11-08 DIAGNOSIS — Z638 Other specified problems related to primary support group: Secondary | ICD-10-CM | POA: Diagnosis not present

## 2016-11-08 DIAGNOSIS — F329 Major depressive disorder, single episode, unspecified: Secondary | ICD-10-CM

## 2016-11-08 LAB — CBC
HCT: 42.2 % (ref 36.0–46.0)
HEMOGLOBIN: 14.2 g/dL (ref 12.0–15.0)
MCHC: 33.6 g/dL (ref 30.0–36.0)
MCV: 90.8 fl (ref 78.0–100.0)
PLATELETS: 234 10*3/uL (ref 150.0–400.0)
RBC: 4.65 Mil/uL (ref 3.87–5.11)
RDW: 13.3 % (ref 11.5–15.5)
WBC: 6.9 10*3/uL (ref 4.0–10.5)

## 2016-11-08 LAB — COMPREHENSIVE METABOLIC PANEL
ALK PHOS: 75 U/L (ref 39–117)
ALT: 14 U/L (ref 0–35)
AST: 16 U/L (ref 0–37)
Albumin: 4.6 g/dL (ref 3.5–5.2)
BILIRUBIN TOTAL: 0.7 mg/dL (ref 0.2–1.2)
BUN: 9 mg/dL (ref 6–23)
CALCIUM: 10 mg/dL (ref 8.4–10.5)
CO2: 23 meq/L (ref 19–32)
Chloride: 108 mEq/L (ref 96–112)
Creatinine, Ser: 0.87 mg/dL (ref 0.40–1.20)
GFR: 81.29 mL/min (ref >60.00)
Glucose, Bld: 101 mg/dL — ABNORMAL HIGH (ref 70–99)
Potassium: 4.5 mEq/L (ref 3.5–5.1)
Sodium: 141 mEq/L (ref 135–145)
Total Protein: 7.2 g/dL (ref 6.0–8.3)

## 2016-11-08 LAB — LIPID PANEL
CHOL/HDL RATIO: 4
Cholesterol: 154 mg/dL (ref 0–200)
HDL: 37.8 mg/dL — AB (ref >39.00)
LDL Cholesterol: 97 mg/dL (ref 0–99)
NonHDL: 116.14
TRIGLYCERIDES: 98 mg/dL (ref 0.0–149.0)
VLDL: 19.6 mg/dL (ref 0.0–40.0)

## 2016-11-08 LAB — HEMOGLOBIN A1C: Hgb A1c MFr Bld: 5.4 % (ref 4.6–6.5)

## 2016-11-08 LAB — TSH: TSH: 2.23 u[IU]/mL (ref 0.35–4.50)

## 2016-11-08 MED ORDER — ALPRAZOLAM 0.5 MG PO TABS: ORAL_TABLET | ORAL | 1 refills | Status: DC

## 2016-11-08 MED ORDER — FLUOXETINE HCL 40 MG PO CAPS: 40.0000 mg | ORAL_CAPSULE | Freq: Every day | ORAL | 3 refills | Status: DC

## 2016-11-08 NOTE — Progress Notes (Addendum)
[place] at [facility] [address] [place], [place] [address] [phone]  Date:  11/08/2016   Name:  Olivia Reyes   DOB:  16-May-1986   MRN:  [id number]  PCP:  Darreld Mclean, MD    Chief Complaint: Follow-up (Pt here for 2 month f/u visit. Tolerating FLUoxetine HCI (PROZAC) well. Will need refill on ALPRAZolam. )   History of Present Illness:  Olivia Reyes is a 31 y.o. very pleasant female patient who presents with the following:  Here today for a 2 month follow-up visit Seen by myself in April- HPI as follows:  States that she would most like to discuss anxiety and depression today. She used to see a Engineer, production healer.  She started on lexapro a few years ago.  This physician then left the practice.  Cody increased her lexapro dose several months ago and added prn xanax for panic.  At first the increased dose did seem to help her,but over the last couple of months she has noted more symptoms again  She has been on lexapro for about 2 years overall.  Her dose has increased from 5 to 20 mg over the last couple of years.  With each increase she did better for a time but then sx would return  She would like to get back into seeing a counselor.  Wonders about being "tested for depression." She notes that depression and anxiety run in her family.  She often feels unmotivated, feels very tired a lot of of the time.  Little things will effect her more than she would expect, she may become tearful  She is anxious about 85% of the time, feels like she is less interested in things less than she used to be.  . Sleeping well- too much.  appetite is ok She works in a Museum/gallery curator.   She lives with her BF, she had a daughter who is 83, and a step -daughter (BF's child) who is 53 yo.   Things are "good at home,"  But her daughter does struggle with ADHD.   She feels like things are good at her job and with her BF. He is supportive  of her.   She stays busy with her children, and enjoys riding motorcycles with her BF as well.  She attributes a lot of her anxiety to her relationship with her mother-  About 6 years ago her step brother died, but she denies other major traumas in her life.    She is using her xanax prn for panic- may take it once a week or so.  Sometimes more.   Denies any SI She is interested in getting a new counselor and I will give her this info  We changed her medication from lexapro to prozac at that visit and is very pleased with her results She still feels tired, but her depression is getting much better. "I have noticed a tremendous difference."   Over the last couple of months there has been some stress -a friend passed away in a motorcycle accident.  However she feels that she is handling this well given the circumstances She is down just a few lbs, BP is fine (not on BP meds)  She is taking 40 mg of prozac at this time   She is using her xanax 2-3x a week when needed for a panic attack I gave her #30 2 months ago She is sleeping well.  She notes that she often fatigued, but no one has ever mentioned that she stops breathing at night.  She has not though about OSA but would be willing to consider a sleep study There is a family history of thyroid disorder and she would like to check this today  She is fasting this am  She is on depo provera Lab Results  Component Value Date   TSH 2.03 11/11/2015    BP Readings from Last 3 Encounters:  11/08/16 106/82  09/10/16 104/62  08/14/16 (!) 109/59    Wt Readings from Last 3 Encounters:  11/08/16 228 lb 12.8 oz (103.8 kg)  09/10/16 232 lb 6.4 oz (105.4 kg)  05/30/16 229 lb 12.8 oz (104.2 kg)    Patient Active Problem List   Diagnosis Date Noted  . Rash and nonspecific skin eruption 11/15/2015  . Visit for preventive health examination 11/11/2015  . Obesity 11/11/2015  . Anxiety 07/27/2013  . Abnormal Pap smear of cervix 07/27/2013     Past Medical History:  Diagnosis Date  . Anxiety   . Chicken pox   . Strep throat     Past Surgical History:  Procedure Laterality Date  . CHOLECYSTECTOMY  2013  . WISDOM TOOTH EXTRACTION      Social History  Substance Use Topics  . Smoking status: Former Smoker    Quit date: 07/27/2012  . Smokeless tobacco: Never Used  . Alcohol use 0.6 oz/week    1 Standard drinks or equivalent per week    Family History  Problem Relation Age of Onset  . Hypertension Mother   . Arthritis Maternal Grandmother   . Hyperlipidemia Maternal Grandmother   . Hypertension Maternal Grandmother   . Throat cancer Maternal Grandfather   . Stroke Maternal Aunt   . Healthy Sister        x1  . Healthy Daughter        x1    No Known Allergies  Medication list has been reviewed and updated.  Current Outpatient Prescriptions on File Prior to Visit  Medication Sig Dispense Refill  . ALPRAZolam (XANAX) 0.5 MG tablet TAKE 1 TABLET BY MOUTH TWICE A DAY AS NEEDED FOR ANXIETY 30 tablet 0  . Calcium Carb-Cholecalciferol (CALCIUM-VITAMIN D) 600-400 MG-UNIT TABS Take 1 each by mouth daily.    Marland Kitchen FLUoxetine (PROZAC) 20 MG capsule Take 1 capsule (20 mg total) by mouth daily. Increase to 2 tablets after 1-2 weeks (Patient taking differently: Take 20 mg by mouth daily. Take 2 capsules by mouth daily  (40 mg )) 60 capsule 6  . medroxyPROGESTERone (DEPO-PROVERA) 150 MG/ML injection Inject 150 mg into the muscle every 3 (three) months.    . Multiple Vitamin (MULTIVITAMIN) tablet Take 1 tablet by mouth daily.     No current facility-administered medications on file prior to visit.     Review of Systems:  As per HPI- otherwise negative. No SI No fever, chills, CP, SOB, nausea or vomiting   Physical Examination: Vitals:   11/08/16 0917  BP: 106/82  Pulse: 60  Temp: 98.1 F (36.7 C)   Vitals:   11/08/16 0917  Weight: 228 lb 12.8 oz (103.8 kg)  Height: 5\' 11"  (1.803 m)   Body mass index is 31.91  kg/m. Ideal Body Weight: Weight in (lb) to have BMI = 25: 178.9  GEN: WDWN, NAD, Non-toxic, A & O x 3, overweight, looks well HEENT: Atraumatic, Normocephalic. Neck supple. No masses, No LAD.  She has an adequate posterio oropharynx  but does have visible tonsils  Ears and Nose: No external deformity. CV: RRR, No M/G/R. No JVD. No thrill. No extra heart sounds. PULM: CTA B, no wheezes, crackles, rhonchi. No retractions. No resp. distress. No accessory muscle use. EXTR: No c/c/e NEURO Normal gait.  PSYCH: Normally interactive. Conversant. Not depressed or anxious appearing.  Calm demeanor.    Assessment and Plan: Anxiety and depression - Plan: ALPRAZolam (XANAX) 0.5 MG tablet, FLUoxetine (PROZAC) 40 MG capsule  Stress due to family tension  Chronic fatigue, unspecified - Plan: TSH  Screening for diabetes mellitus - Plan: Comprehensive metabolic panel, Hemoglobin A1c  Screening for hyperlipidemia - Plan: Lipid panel  Screening for deficiency anemia - Plan: CBC  Refilled her prn xanax and also prozac- changed to 40 mg strength for her Labs pending as above Will contact her with labs- consider sleep study if TSH is normal   Signed Lamar Blinks, MD  Received her labs- message to pt  Results for orders placed or performed in visit on 11/08/16  CBC  Result Value Ref Range   WBC 6.9 4.0 - 10.5 K/uL   RBC 4.65 3.87 - 5.11 Mil/uL   Platelets 234.0 150.0 - 400.0 K/uL   Hemoglobin 14.2 12.0 - 15.0 g/dL   HCT 42.2 36.0 - 46.0 %   MCV 90.8 78.0 - 100.0 fl   MCHC 33.6 30.0 - 36.0 g/dL   RDW 13.3 11.5 - 15.5 %  Comprehensive metabolic panel  Result Value Ref Range   Sodium 141 135 - 145 mEq/L   Potassium 4.5 3.5 - 5.1 mEq/L   Chloride 108 96 - 112 mEq/L   CO2 23 19 - 32 mEq/L   Glucose, Bld 101 (H) 70 - 99 mg/dL   BUN 9 6 - 23 mg/dL   Creatinine, Ser 0.87 0.40 - 1.20 mg/dL   Total Bilirubin 0.7 0.2 - 1.2 mg/dL   Alkaline Phosphatase 75 39 - 117 U/L   AST 16 0 - 37 U/L    ALT 14 0 - 35 U/L   Total Protein 7.2 6.0 - 8.3 g/dL   Albumin 4.6 3.5 - 5.2 g/dL   Calcium 10.0 8.4 - 10.5 mg/dL   GFR 81.29 >60.00 mL/min  Lipid panel  Result Value Ref Range   Cholesterol 154 0 - 200 mg/dL   Triglycerides 98.0 0.0 - 149.0 mg/dL   HDL 37.80 (L) >39.00 mg/dL   VLDL 19.6 0.0 - 40.0 mg/dL   LDL Cholesterol 97 0 - 99 mg/dL   Total CHOL/HDL Ratio 4    NonHDL 116.14   TSH  Result Value Ref Range   TSH 2.23 0.35 - 4.50 uIU/mL  Hemoglobin A1c  Result Value Ref Range   Hgb A1c MFr Bld 5.4 4.6 - 6.5 %

## 2016-11-08 NOTE — Patient Instructions (Addendum)
It was very nice to see you again today- I am so glad that you are overall feeling better Take care and I will be in touch with your labs asap If your thyroid is normal, I would suggest that we consider a sleep study to see if you may have sleep apnea.  This condition can contribute to fatigue and difficulty with weight loss

## 2016-12-18 DIAGNOSIS — Z3042 Encounter for surveillance of injectable contraceptive: Secondary | ICD-10-CM | POA: Diagnosis not present

## 2016-12-18 DIAGNOSIS — Z01419 Encounter for gynecological examination (general) (routine) without abnormal findings: Secondary | ICD-10-CM | POA: Diagnosis not present

## 2016-12-18 DIAGNOSIS — Z6833 Body mass index (BMI) 33.0-33.9, adult: Secondary | ICD-10-CM | POA: Diagnosis not present

## 2016-12-27 ENCOUNTER — Encounter: Payer: Self-pay | Admitting: Family Medicine

## 2017-01-10 ENCOUNTER — Encounter: Payer: Self-pay | Admitting: Family Medicine

## 2017-01-10 ENCOUNTER — Ambulatory Visit (INDEPENDENT_AMBULATORY_CARE_PROVIDER_SITE_OTHER): Payer: BLUE CROSS/BLUE SHIELD | Admitting: Family Medicine

## 2017-01-10 VITALS — BP 118/74 | HR 70 | Temp 98.2°F | Ht 71.0 in | Wt 231.4 lb

## 2017-01-10 DIAGNOSIS — J029 Acute pharyngitis, unspecified: Secondary | ICD-10-CM | POA: Diagnosis not present

## 2017-01-10 DIAGNOSIS — H6983 Other specified disorders of Eustachian tube, bilateral: Secondary | ICD-10-CM | POA: Diagnosis not present

## 2017-01-10 DIAGNOSIS — R0982 Postnasal drip: Secondary | ICD-10-CM | POA: Diagnosis not present

## 2017-01-10 DIAGNOSIS — H6993 Unspecified Eustachian tube disorder, bilateral: Secondary | ICD-10-CM

## 2017-01-10 MED ORDER — DEXAMETHASONE 4 MG PO TABS: 8.0000 mg | ORAL_TABLET | Freq: Once | ORAL | 0 refills | Status: AC

## 2017-01-10 MED ORDER — LEVOCETIRIZINE DIHYDROCHLORIDE 5 MG PO TABS: 5.0000 mg | ORAL_TABLET | Freq: Every evening | ORAL | 3 refills | Status: DC

## 2017-01-10 NOTE — Patient Instructions (Signed)
Claritin (loratadine), Allegra (fexofenadine), Zyrtec (cetirizine); these are listed in order from weakest to strongest. Generic, and therefore cheaper, options are in the parentheses.   There are available OTC, and the generic versions, which may be cheaper, are in parentheses. Show this to a pharmacist if you have trouble finding any of these items.  Continue to push fluids, practice good hand hygiene, and cover your mouth if you cough.  If you start having fevers, shaking or shortness of breath, seek immediate care.

## 2017-01-10 NOTE — Progress Notes (Signed)
SUBJECTIVE:   Olivia Reyes is a 31 y.o. female presents to the clinic for:  Chief Complaint  Patient presents with  . Sore Throat    Patient is here today C/O throat pain x 1.5 wks. Sx began with sinus drainage. Has had a H/A x4d.    Complains of sore throat for 1.5 weeks.  Other associated symptoms: rhinorrhea, ear pain and mild cough.  Denies: sinus congestion, sinus pain, itchy watery eyes, red eyes, ear drainage, shortness of breath and fevers/rigors Sick Contacts: none known Therapy to date: NSAIDs, Benadryl, Sudafed  History  Smoking Status  . Former Smoker  . Quit date: 07/27/2012  Smokeless Tobacco  . Never Used    ROS: Pertinent items are noted in HPI  Patient's medications, allergies, past medical, surgical, social and family histories were reviewed and updated as appropriate.  OBJECTIVE:  BP 118/74 (BP Location: Right Arm, Patient Position: Sitting, Cuff Size: Large)   Pulse 70   Temp 98.2 F (36.8 C) (Oral)   Ht 5\' 11"  (1.803 m)   Wt 231 lb 6.4 oz (105 kg)   SpO2 98%   BMI 32.27 kg/m  General: Awake, alert, appearing stated age Eyes: conjunctivae and sclerae clear Ears: normal TMs bilaterally Nose: no visible exudate Oropharynx: lips, mucosa, and tongue normal; teeth and gums normal and pharynx and tonsils WNL, post nasal drip noted Neck: supple, no significant adenopathy Lungs: clear to auscultation, no wheezes, rales or rhonchi, symmetric air entry, normal effort Heart: rate and rhythm regular Skin:reveals no rash Psych: Age appropriate judgment and insight  ASSESSMENT/PLAN:  Sore throat - Plan: dexamethasone (DECADRON) 4 MG tablet  Dysfunction of both eustachian tubes - Plan: dexamethasone (DECADRON) 4 MG tablet  Post-nasal drip - Plan: levocetirizine (XYZAL) 5 MG tablet  Orders as above. One time dose of steroid. Question whether this is related to allergies (ragweed?). Will call in antihistamine, list of OTC options also discussed. Continue to  practice good hand hygiene and push fluids. F/u prn. Pt voiced understanding and agreement to the plan.  [place], DO 01/10/17 8:22 AM

## 2017-01-15 NOTE — Progress Notes (Signed)
[facility] [address], [place], [place] [address] [phone]  Date:  01/17/2017   Name:  Olivia Reyes   DOB:  [birth year]   MRN:  [id number]  PCP:  Darreld Mclean, MD    Chief Complaint: Sore Throat (following up from last visit. Says its gotten better but still hurts. States she thinks it may be her tonsils); Fall (Pt fell about a month ago and is still having back pains); and Back Pain   History of Present Illness:  Olivia Reyes is a 31 y.o. very pleasant female patient who presents with the following:  Here today to follow-up recent illness She was seen on 8/16 (a week ago) with ST and possible allergies Treated with a one time dose of decadron, OTC antihistamines  Later that day she developed a fever and vomiting, back pain. This reminded her of when she had cholecystitis in 2013 She did not notice any urinary sx at that time She has never had pyelo but has had a UTI in the past  The fever went away in about 24 hours Vomiting lasted 12 hours No diarrhea Her throat feels much better now-  On Saturday/ Sunday she noted that she felt lightheaded/ dizzy.   No cough, no CP, no SOB  About a month ago she fell off a golf cart and hit her head- however she had no LOC and though that she was ok, she is not sure if this could have effected her but she felt ok- no particular HA, no confusion, no mental slowing after the injury.  She just wanted to mention this  She is drinking plenty of water  She has been on depo for years, does not get menses. She has not missed any shots  She last took some NSAID last night- no antipyretics so far today   Patient Active Problem List   Diagnosis Date Noted  . Rash and nonspecific skin eruption 11/15/2015  . Visit for preventive health examination 11/11/2015  . Obesity 11/11/2015  . Anxiety 07/27/2013  . Abnormal Pap smear of cervix 07/27/2013    Past Medical History:   Diagnosis Date  . Anxiety   . Chicken pox   . Strep throat     Past Surgical History:  Procedure Laterality Date  . CHOLECYSTECTOMY  2013  . WISDOM TOOTH EXTRACTION      Social History  Substance Use Topics  . Smoking status: Former Smoker    Quit date: 07/27/2012  . Smokeless tobacco: Never Used  . Alcohol use 0.6 oz/week    1 Standard drinks or equivalent per week    Family History  Problem Relation Age of Onset  . Hypertension Mother   . Arthritis Maternal Grandmother   . Hyperlipidemia Maternal Grandmother   . Hypertension Maternal Grandmother   . Throat cancer Maternal Grandfather   . Stroke Maternal Aunt   . Healthy Sister        x1  . Healthy Daughter        x1    No Known Allergies  Medication list has been reviewed and updated.  Current Outpatient Prescriptions on File Prior to Visit  Medication Sig Dispense Refill  . ALPRAZolam (XANAX) 0.5 MG tablet TAKE 1 TABLET BY MOUTH TWICE A DAY AS NEEDED FOR ANXIETY 30 tablet 1  . Calcium Carb-Cholecalciferol (CALCIUM-VITAMIN D) 600-400 MG-UNIT TABS Take 1 each by mouth daily.    Marland Kitchen FLUoxetine (PROZAC) 40  MG capsule Take 1 capsule (40 mg total) by mouth daily. 90 capsule 3  . levocetirizine (XYZAL) 5 MG tablet Take 1 tablet (5 mg total) by mouth every evening. 30 tablet 3  . medroxyPROGESTERone (DEPO-PROVERA) 150 MG/ML injection Inject 150 mg into the muscle every 3 (three) months.    . Multiple Vitamin (MULTIVITAMIN) tablet Take 1 tablet by mouth daily.     No current facility-administered medications on file prior to visit.     Review of Systems:  As per HPI- otherwise negative.   Physical Examination: Vitals:   01/17/17 1658  BP: 109/72  Temp: 98.5 F (36.9 C)   Vitals:   01/17/17 1658  Weight: 230 lb (104.3 kg)  Height: 5\' 11"  (1.803 m)   Body mass index is 32.08 kg/m. Ideal Body Weight: Weight in (lb) to have BMI = 25: 178.9  GEN: WDWN, NAD, Non-toxic, A & O x 3, looks well HEENT:  Atraumatic, Normocephalic. Neck supple. No masses, No LAD.  Bilateral TM wnl, oropharynx normal.  PEERL,EOMI.   Ears and Nose: No external deformity. CV: RRR, No M/G/R. No JVD. No thrill. No extra heart sounds. PULM: CTA B, no wheezes, crackles, rhonchi. No retractions. No resp. distress. No accessory muscle use. ABD: S, NT, ND, +BS. No rebound. No HSM.   Belly is benign today EXTR: No c/c/e NEURO Normal gait.  PSYCH: Normally interactive. Conversant. Not depressed or anxious appearing.  Calm demeanor.  No CVA tenderness   Results for orders placed or performed in visit on 01/17/17  POCT rapid strep A  Result Value Ref Range   Rapid Strep A Screen Negative Negative  POCT urinalysis dipstick  Result Value Ref Range   Color, UA yellow yellow   Clarity, UA cloudy (A) clear   Glucose, UA negative negative mg/dL   Bilirubin, UA negative negative   Ketones, POC UA negative negative mg/dL   Spec Grav, UA >=1.030 (A) 1.010 - 1.025   Blood, UA negative negative   pH, UA 6.0 5.0 - 8.0   Protein Ur, POC negative negative mg/dL   Urobilinogen, UA 0.2 0.2 or 1.0 E.U./dL   Nitrite, UA Negative Negative   Leukocytes, UA Negative Negative    Assessment and Plan: Pharyngitis, unspecified etiology - Plan: POCT rapid strep A  Bilateral low back pain without sciatica, unspecified chronicity - Plan: POCT urinalysis dipstick here today to follow-up an illness from last week. She was seen with a fever, and then developed fever, vomiting; however all of these sx are now resolved.  Her mother wanted her to be seen in case this might be a kidney stone UA is reassuring today-no sign of a kidney stone.  She will keep me apprised of her sx- if any worsening she will contact me right away   Signed Lamar Blinks, MD

## 2017-01-17 ENCOUNTER — Ambulatory Visit (INDEPENDENT_AMBULATORY_CARE_PROVIDER_SITE_OTHER): Payer: BLUE CROSS/BLUE SHIELD | Admitting: Family Medicine

## 2017-01-17 ENCOUNTER — Encounter: Payer: Self-pay | Admitting: Family Medicine

## 2017-01-17 VITALS — BP 109/72 | HR 60 | Temp 98.5°F | Ht 71.0 in | Wt 230.0 lb

## 2017-01-17 DIAGNOSIS — J029 Acute pharyngitis, unspecified: Secondary | ICD-10-CM | POA: Diagnosis not present

## 2017-01-17 DIAGNOSIS — M545 Low back pain, unspecified: Secondary | ICD-10-CM

## 2017-01-17 LAB — POCT URINALYSIS DIP (MANUAL ENTRY)
BILIRUBIN UA: NEGATIVE
BILIRUBIN UA: NEGATIVE mg/dL
Blood, UA: NEGATIVE
Glucose, UA: NEGATIVE mg/dL
LEUKOCYTES UA: NEGATIVE
Nitrite, UA: NEGATIVE
PH UA: 6 (ref 5.0–8.0)
PROTEIN UA: NEGATIVE mg/dL
Urobilinogen, UA: 0.2 E.U./dL

## 2017-01-17 LAB — POCT RAPID STREP A (OFFICE): RAPID STREP A SCREEN: NEGATIVE

## 2017-01-17 NOTE — Patient Instructions (Signed)
Keep me posted as far as how you are feeling- rest and take it easy the next few days Let me know if you are not getting back to normal

## 2017-03-05 DIAGNOSIS — Z3042 Encounter for surveillance of injectable contraceptive: Secondary | ICD-10-CM | POA: Diagnosis not present

## 2017-03-22 DIAGNOSIS — M9904 Segmental and somatic dysfunction of sacral region: Secondary | ICD-10-CM | POA: Diagnosis not present

## 2017-03-22 DIAGNOSIS — M9901 Segmental and somatic dysfunction of cervical region: Secondary | ICD-10-CM | POA: Diagnosis not present

## 2017-03-22 DIAGNOSIS — M9902 Segmental and somatic dysfunction of thoracic region: Secondary | ICD-10-CM | POA: Diagnosis not present

## 2017-03-26 ENCOUNTER — Encounter: Payer: Self-pay | Admitting: Family Medicine

## 2017-03-26 ENCOUNTER — Encounter: Payer: Self-pay | Admitting: Internal Medicine

## 2017-03-26 ENCOUNTER — Ambulatory Visit (INDEPENDENT_AMBULATORY_CARE_PROVIDER_SITE_OTHER): Payer: BLUE CROSS/BLUE SHIELD | Admitting: Internal Medicine

## 2017-03-26 VITALS — BP 124/76 | HR 53 | Temp 97.7°F | Resp 14 | Ht 71.0 in | Wt 233.1 lb

## 2017-03-26 DIAGNOSIS — J069 Acute upper respiratory infection, unspecified: Secondary | ICD-10-CM

## 2017-03-26 MED ORDER — AZELASTINE HCL 0.1 % NA SOLN: 2.0000 | Freq: Two times a day (BID) | NASAL | 6 refills | Status: DC

## 2017-03-26 NOTE — Progress Notes (Signed)
Subjective:    Patient ID: Olivia Reyes, female    DOB: 05/29/1985, 31 y.o.   MRN: [id number]  DOS:  03/26/2017 Type of visit - description : acute Interval history: Sx started ~ 5 days ago w/ sinus congestion, clear discharge, ear pressure. 3 days ago symptoms "moved to the chest" and she felt chest tightness, like the chest was "on fire". OTC Mucinex is not helping much. Denies any history of asthma, no tobacco abuse  Review of Systems No actual fevers that she can tell . + sneezing but no itchy eyes or nose No GERD symptoms No chest pain, mild shortness of breath and wheezing (?)  Past Medical History:  Diagnosis Date  . Anxiety   . Chicken pox   . Strep throat     Past Surgical History:  Procedure Laterality Date  . CHOLECYSTECTOMY  2013  . WISDOM TOOTH EXTRACTION      Social History   Social History  . Marital status: Single    Spouse name: N/A  . Number of children: N/A  . Years of education: N/A   Occupational History  . Not on file.   Social History Main Topics  . Smoking status: Former Smoker    Quit date: 07/27/2012  . Smokeless tobacco: Never Used  . Alcohol use 0.6 oz/week    1 Standard drinks or equivalent per week  . Drug use: No  . Sexual activity: Yes    Birth control/ protection: None     Comment: men   Other Topics Concern  . Not on file   Social History Narrative  . No narrative on file      Allergies as of 03/26/2017   No Known Allergies     Medication List       Accurate as of 03/26/17  1:28 PM. Always use your most recent med list.          ALPRAZolam 0.5 MG tablet Commonly known as:  XANAX TAKE 1 TABLET BY MOUTH TWICE A DAY AS NEEDED FOR ANXIETY   azelastine 0.1 % nasal spray Commonly known as:  ASTELIN Place 2 sprays into both nostrils 2 (two) times daily.   Calcium-Vitamin D 600-400 MG-UNIT Tabs Take 1 each by mouth daily.   cetirizine 10 MG tablet Commonly known as:  ZYRTEC Take 10 mg by mouth daily.     FLUoxetine 40 MG capsule Commonly known as:  PROZAC Take 1 capsule (40 mg total) by mouth daily.   medroxyPROGESTERone 150 MG/ML injection Commonly known as:  DEPO-PROVERA Inject 150 mg into the muscle every 3 (three) months.   multivitamin tablet Take 1 tablet by mouth daily.          Objective:   Physical Exam BP 124/76 (BP Location: Left Arm, Patient Position: Sitting, Cuff Size: Small)   Pulse (!) 53   Temp 97.7 F (36.5 C) (Oral)   Resp 14   Ht 5\' 11"  (1.803 m)   Wt 233 lb 2 oz (105.7 kg)   SpO2 97%   BMI 32.51 kg/m  General:   Well developed, well nourished . NAD.  HEENT:  Normocephalic . Face symmetric, atraumatic. TMs bulging bilaterally but not red.  Throat symmetric no red.  Nose quite congested, sinuses non-TTP Lungs:  CTA B Normal respiratory effort, no intercostal retractions, no accessory muscle use. Heart: RRR,  no murmur.  No pretibial edema bilaterally  Skin: Not pale. Not jaundice Neurologic:  alert & oriented X3.  Speech normal, gait  appropriate for age and unassisted Psych--  Cognition and judgment appear intact.  Cooperative with normal attention span and concentration.  Behavior appropriate. No anxious or depressed appearing.      Assessment & Plan:   31 year old lady with history of anxiety, on Depo-Provera presents with the following  URI: No clinical evidence of bacterial complications, recommend Mucinex DM, Astelin, Flonase, rest, Tylenol, low-dose of sudafed.  See AVS.  Call if no better

## 2017-03-26 NOTE — Progress Notes (Signed)
Pre visit review using our clinic review tool, if applicable. No additional management support is needed unless otherwise documented below in the visit note. 

## 2017-03-26 NOTE — Patient Instructions (Addendum)
Rest, fluids , tylenol  For cough:  Take Mucinex DM twice a day as needed until better  For nasal congestion: Use OTC Nasocort or Flonase : 2 nasal sprays on each side of the nose in the morning until you feel better Use ASTELIN a prescribed spray : 2 nasal sprays on each side of the nose twice a day until you feel better   Get pseudoephedrine 30 mg (behind the counter, you need to talk with the pharmacist) take one tablet 3 or 4 times a day as needed for congestion   Call if not gradually better over the next  10 days  Call anytime if the symptoms are severe

## 2017-04-02 ENCOUNTER — Telehealth: Payer: Self-pay

## 2017-04-02 NOTE — Telephone Encounter (Signed)
[facility] her up to see me to look at her ear if need be

## 2017-04-02 NOTE — Telephone Encounter (Signed)
[facility] follow up call made to patient. Called to report she had more fluid and pain in her ear after being seen recently. Wants to know if she could get more medication call in for her. Called patient left message for return call.

## 2017-04-03 NOTE — Telephone Encounter (Signed)
ollow up call made to patient regarding Dr. Serita Grit response. States she does not need an appointment at this time Says she used Sweet oil and a cotton ball because she does not want to pay for another office visit. States her ear feels better at this time and will call back for an appointment if the pain returns.

## 2017-05-23 DIAGNOSIS — Z3042 Encounter for surveillance of injectable contraceptive: Secondary | ICD-10-CM | POA: Diagnosis not present

## 2017-05-27 ENCOUNTER — Encounter: Payer: Self-pay | Admitting: Medical

## 2017-05-27 ENCOUNTER — Ambulatory Visit: Payer: BLUE CROSS/BLUE SHIELD | Admitting: Medical

## 2017-05-27 VITALS — BP 121/46 | HR 53 | Temp 98.1°F | Resp 16 | Ht 71.0 in | Wt 235.4 lb

## 2017-05-27 DIAGNOSIS — B029 Zoster without complications: Secondary | ICD-10-CM

## 2017-05-27 DIAGNOSIS — L089 Local infection of the skin and subcutaneous tissue, unspecified: Secondary | ICD-10-CM | POA: Diagnosis not present

## 2017-05-27 MED ORDER — FAMCICLOVIR 500 MG PO TABS: 500.0000 mg | ORAL_TABLET | Freq: Three times a day (TID) | ORAL | 0 refills | Status: DC

## 2017-05-27 MED ORDER — DOXYCYCLINE HYCLATE 100 MG PO TABS: 100.0000 mg | ORAL_TABLET | Freq: Two times a day (BID) | ORAL | 0 refills | Status: DC

## 2017-05-27 NOTE — Patient Instructions (Addendum)
You do appear to have early shingles on inspection of left buttock/ thigh area. Also you appear to have secondary infection/bacterial.   Will rx famvir antiviral and doxycycline oral antibiotic. Rx advisement given.   Our lab is closed but sent message to lab staff to see if on Wednesday we can do same test ID was trying to get in the past.  Follow up 2 weeks or as needed

## 2017-05-27 NOTE — Progress Notes (Signed)
Subjective:    Patient ID: Olivia Reyes, female    DOB: 30-Jul-1985, 30 y.o.   MRN: [id number]  HPI  Pt in for  rash out break of left upper lateral thigh/buttox area. Noticed some tingling to area on Saturday and blisters on Sunday. Hx of shingles like outbreak in past in same area over past 2-3 years.  This would be 3rd time.  Pt in past had been seen by infectious disease but lesion had already healed up by the time she saw them.  Skin is very sensitive now. Sharp burning sensation.  Pt is in depo provera. She is due for next August 10, 2016.    Review of Systems  Constitutional: Negative for chills, fatigue and fever.  Respiratory: Negative for chest tightness, shortness of breath and wheezing.   Cardiovascular: Negative for chest pain and palpitations.  Gastrointestinal: Negative for abdominal distention and anal bleeding.  Skin: Positive for rash.       See hpi  Neurological: Negative for dizziness and headaches.  Hematological: Negative for adenopathy. Does not bruise/bleed easily.   Past Medical History:  Diagnosis Date  . Anxiety   . Chicken pox   . Strep throat      Social History   Socioeconomic History  . Marital status: Single    Spouse name: Not on file  . Number of children: Not on file  . Years of education: Not on file  . Highest education level: Not on file  Social Needs  . Financial resource strain: Not on file  . Food insecurity - worry: Not on file  . Food insecurity - inability: Not on file  . Transportation needs - medical: Not on file  . Transportation needs - non-medical: Not on file  Occupational History  . Not on file  Tobacco Use  . Smoking status: Former Smoker    Last attempt to quit: 07/27/2012    Years since quitting: 4.8  . Smokeless tobacco: Never Used  Substance and Sexual Activity  . Alcohol use: Yes    Alcohol/week: 0.6 oz    Types: 1 Standard drinks or equivalent per week  . Drug use: No  . Sexual activity: Yes   Birth control/protection: None    Comment: men  Other Topics Concern  . Not on file  Social History Narrative  . Not on file    Past Surgical History:  Procedure Laterality Date  . CHOLECYSTECTOMY  2013  . WISDOM TOOTH EXTRACTION      Family History  Problem Relation Age of Onset  . Hypertension Mother   . Arthritis Maternal Grandmother   . Hyperlipidemia Maternal Grandmother   . Hypertension Maternal Grandmother   . Throat cancer Maternal Grandfather   . Stroke Maternal Aunt   . Healthy Sister        x1  . Healthy Daughter        x1    No Known Allergies  Current Outpatient Medications on File Prior to Visit  Medication Sig Dispense Refill  . ALPRAZolam (XANAX) 0.5 MG tablet TAKE 1 TABLET BY MOUTH TWICE A DAY AS NEEDED FOR ANXIETY 30 tablet 1  . azelastine (ASTELIN) 0.1 % nasal spray Place 2 sprays into both nostrils 2 (two) times daily. 30 mL 6  . Calcium Carb-Cholecalciferol (CALCIUM-VITAMIN D) 600-400 MG-UNIT TABS Take 1 each by mouth daily.    . cetirizine (ZYRTEC) 10 MG tablet Take 10 mg by mouth daily.    Marland Kitchen FLUoxetine (PROZAC) 40 MG capsule  Take 1 capsule (40 mg total) by mouth daily. 90 capsule 3  . medroxyPROGESTERone (DEPO-PROVERA) 150 MG/ML injection Inject 150 mg into the muscle every 3 (three) months.    . Multiple Vitamin (MULTIVITAMIN) tablet Take 1 tablet by mouth daily.     No current facility-administered medications on file prior to visit.     BP (!) 121/46   Pulse (!) 53   Temp 98.1 F (36.7 C) (Oral)   Resp 16   Ht 5\' 11"  (1.803 m)   Wt 235 lb 6.4 oz (106.8 kg)   SpO2 100%   BMI 32.83 kg/m       Objective:   Physical Exam  General Mental Status- Alert. General Appearance- Not in acute distress.   Skin At the junction of her left buttock and hamstring region she has about 1 cm red indurated area with scattered small pinpoint appearing vesicles.  There is no fluctuance to the area.  The area is tender to touch.   Neck Carotid  Arteries- Normal color. Moisture- Normal Moisture. No carotid bruits. No JVD.  Chest and Lung Exam Auscultation: Breath Sounds:-Normal.  Cardiovascular Auscultation:Rythm- Regular. Murmurs & Other Heart Sounds:Auscultation of the heart reveals- No Murmurs.        Assessment & Plan:  You do appear to have early shingles on inspection of  Buttock/ thigh area. Also you appear to have secondary infection/bacterial.   Will rx famvir antiviral and doxycycline oral antibiotic. Rx advisement given.   Our lab is closed but sent message to lab staff to see if on Wednesday we can do same test ID was trying to get in the past.  Follow up 2 weeks or as needed  Renelda Kilian, Percell Miller, [employer]

## 2017-05-28 DIAGNOSIS — B029 Zoster without complications: Secondary | ICD-10-CM

## 2017-05-28 DIAGNOSIS — J358 Other chronic diseases of tonsils and adenoids: Secondary | ICD-10-CM

## 2017-05-28 HISTORY — DX: Other chronic diseases of tonsils and adenoids: J35.8

## 2017-05-28 HISTORY — PX: HAND SURGERY: SHX662

## 2017-05-28 HISTORY — DX: Zoster without complications: B02.9

## 2017-05-29 ENCOUNTER — Telehealth: Payer: Self-pay

## 2017-05-29 ENCOUNTER — Telehealth: Payer: Self-pay | Admitting: Medical

## 2017-05-29 ENCOUNTER — Telehealth: Payer: Self-pay | Admitting: Infectious Diseases

## 2017-05-29 ENCOUNTER — Encounter: Payer: Self-pay | Admitting: Medical

## 2017-05-29 NOTE — Telephone Encounter (Signed)
Patient called wanting to make an appointment with Dr. Johnnye Sima states that she had a recent Shingles outbreak and was hoping she could be seen within the next day or two. I was unable to find any open appointment times within that time frame pt stated she would stick with her " regular doctor" if that was the case. She would like for Dr. Johnnye Sima to call her work number and follow up with her via phone call if possible today since she is at work all day.  [place]

## 2017-05-29 NOTE — Telephone Encounter (Signed)
How about Friday at 9:30am?

## 2017-05-29 NOTE — Telephone Encounter (Signed)
Called pt due to concerns about shingles outbreak.  No answer on cell or work #  She needs viral Cx, HSV PCR on blister.

## 2017-05-29 NOTE — Telephone Encounter (Signed)
Pt needs viral Cx, HSV PCR on blister. Not sure if we can do that will you ask Kristi or Levada Dy if can do that in our office.. Pt may get test done with Infectious disease on Friday. But need to know if we have ability to do test in our office.  Need to discuss which lab does that does it get picked up etc.   Might be easier for her to get at infectious disease.

## 2017-05-29 NOTE — Telephone Encounter (Signed)
Called pt and made the appointment for Friday pt stated she would call if she needed to cancel. [place]

## 2017-05-31 ENCOUNTER — Encounter: Payer: Self-pay | Admitting: Infectious Diseases

## 2017-05-31 ENCOUNTER — Ambulatory Visit (INDEPENDENT_AMBULATORY_CARE_PROVIDER_SITE_OTHER): Payer: BLUE CROSS/BLUE SHIELD | Admitting: Infectious Diseases

## 2017-05-31 DIAGNOSIS — B028 Zoster with other complications: Secondary | ICD-10-CM | POA: Diagnosis not present

## 2017-05-31 MED ORDER — VALACYCLOVIR HCL 1 G PO TABS: 1000.0000 mg | ORAL_TABLET | Freq: Every day | ORAL | 1 refills | Status: DC

## 2017-05-31 NOTE — Assessment & Plan Note (Signed)
She states she is working on losing wt.  Will continue to encourage her.

## 2017-05-31 NOTE — Addendum Note (Signed)
Addended by: Dolan Amen D on: 05/31/2017 11:33 AM   Modules accepted: Orders

## 2017-05-31 NOTE — Assessment & Plan Note (Addendum)
She has mild induration, tenderness at site.  Unfortunately, no blisters to send for Cx, PCR.  Will send Ab tests for HSV, VZV. These tend not to be as reliable but given how close she is to sx hopefully will be helpful.  Will give her valtrex rx to have on hand- 1g po daily for 5 days If she has persistent sx of weakness, numbness would consider MRI of spine to r/o myelitis.  Will see her back in 2 weeks

## 2017-05-31 NOTE — Progress Notes (Signed)
   Subjective:    Patient ID: Olivia Reyes, female    DOB: 1985/08/13, 32 y.o.   MRN: [id number]  HPI 32 yo F with recurrent epsiodes of shingles. First episode ~ 2015.  She was last seen in ID 10-2015 and had  Since 12-27 she has noted tingling on her L thigh. She also noted "whelts and bumps". She had small blisters that turned into a large blister. (she has pictures on her phone). Never noted fluid on bandages.  Was seen by PCP on 12-31. She was given famvir and po anbx (thought she had cellulitis as well).  Still has tinging and pain, improved. Has also noted hypersensitivity on her L calf since. Has noted fatigue of her leg as well.    Review of Systems  Constitutional: Negative for appetite change, chills, fever and unexpected weight change.  Gastrointestinal: Positive for constipation. Negative for diarrhea.  Genitourinary: Negative for difficulty urinating.  Skin: Positive for rash.       Objective:   Physical Exam  Constitutional: She appears well-developed and well-nourished.  HENT:  Mouth/Throat: No oropharyngeal exudate.  Eyes: EOM are normal. Pupils are equal, round, and reactive to light.  Neck: Neck supple.  Cardiovascular: Normal rate, regular rhythm and normal heart sounds.  Pulmonary/Chest: Effort normal.  Abdominal: Soft. Bowel sounds are normal. There is no tenderness. There is no rebound.  Musculoskeletal: She exhibits edema.  Lymphadenopathy:    She has no cervical adenopathy.  Skin: Rash noted.  Psychiatric: She has a normal mood and affect.           Assessment & Plan:

## 2017-06-03 ENCOUNTER — Ambulatory Visit: Payer: BLUE CROSS/BLUE SHIELD | Admitting: Infectious Diseases

## 2017-06-03 ENCOUNTER — Telehealth: Payer: Self-pay | Admitting: *Deleted

## 2017-06-03 LAB — VARICELLA ZOSTER ANTIBODY, IGG: Varicella IgG: 1880 index

## 2017-06-03 LAB — VARICELLA ZOSTER ANTIBODY, IGM: Varicella Zoster Ab IgM: <=0.9 (ref <=0.90)

## 2017-06-03 NOTE — Telephone Encounter (Signed)
Patient called and advised she has had a fever of 101.2, body aches, head aches, and chills. She does not think she is getting a cold or anything because she does not have a cough or stuffy nose. She is able to break the fever with Ibuprofen. She wants to know if she should be seen here. Advised her to continue to treat the symptoms and drink plenty of fluids. Advised her will ask Dr Johnnye Sima and give her a call back once he responds.

## 2017-06-03 NOTE — Telephone Encounter (Signed)
Needs to see her PCP She also needs to be seen by Derm for her rash, her varicella antibodies were negative.

## 2017-06-04 NOTE — Telephone Encounter (Signed)
Tried to call the patient to inform of doctors suggestion and had to leave a message for her to call the office.

## 2017-06-13 ENCOUNTER — Ambulatory Visit: Payer: BLUE CROSS/BLUE SHIELD | Admitting: Infectious Diseases

## 2017-06-18 ENCOUNTER — Encounter: Payer: Self-pay | Admitting: Infectious Diseases

## 2017-06-20 ENCOUNTER — Encounter: Payer: Self-pay | Admitting: Family Medicine

## 2017-07-10 ENCOUNTER — Ambulatory Visit: Payer: BLUE CROSS/BLUE SHIELD | Admitting: Neurology

## 2017-07-10 ENCOUNTER — Encounter: Payer: Self-pay | Admitting: Neurology

## 2017-07-10 VITALS — BP 90/64 | HR 60 | Ht 71.0 in | Wt 232.0 lb

## 2017-07-10 DIAGNOSIS — F39 Unspecified mood [affective] disorder: Secondary | ICD-10-CM

## 2017-07-10 DIAGNOSIS — R51 Headache: Secondary | ICD-10-CM | POA: Diagnosis not present

## 2017-07-10 DIAGNOSIS — E669 Obesity, unspecified: Secondary | ICD-10-CM

## 2017-07-10 DIAGNOSIS — Z820 Family history of epilepsy and other diseases of the nervous system: Secondary | ICD-10-CM

## 2017-07-10 DIAGNOSIS — R519 Headache, unspecified: Secondary | ICD-10-CM

## 2017-07-10 DIAGNOSIS — R0683 Snoring: Secondary | ICD-10-CM

## 2017-07-10 DIAGNOSIS — G4719 Other hypersomnia: Secondary | ICD-10-CM | POA: Diagnosis not present

## 2017-07-10 NOTE — Progress Notes (Signed)
Subjective:    Patient ID: Olivia Reyes is a 32 y.o. female.  HPI     Star Age, MD, PhD [facility] [address] P.O. [place], [place] [address]  Dear Dr. Lorelei Pont,   I saw your patient, Olivia Reyes, upon your kind request in my neurologic clinic today for initial consultation of her sleep disorder, in particular, concern for underlying obstructive sleep apnea. The patient is unaccompanied today. As you know, Olivia Reyes is a 32 year old right-handed woman with an underlying medical history of allergic rhinitis, anxiety, depression, and obesity, who reports snoring and excessive daytime somnolence. I reviewed your office note from 11/08/2016. She reports difficulty losing weight. She does not always wake up rested. She denies night nocturia but has had occasional morning headaches, in the past few months, sometimes she takes Excedrin Migraine for this. She has a maternal aunt with sleep apnea on a CPAP machine. Her mother has not been tested. She does not know her father's medical history her father's side of the family history. 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 She quit smoking in 2012, drinks alcohol maybe on the weekend, caffeine in the form of sweet tea about one cup per day on average, no soda and typically no coffee. She has been on Prozac daily and takes Xanax as needed, maybe 2 or 3 times a week on average. Her weight has been fluctuating. Her Epworth sleepiness score is 7 out of 24, fatigue or is 41 out of 63. She denies telltale symptoms of restless leg syndrome or leg twitching at night.  Her Past Medical History Is Significant For: Past Medical History:  Diagnosis Date  . Anxiety   . Chicken pox   . Strep throat     Her Past Surgical History Is Significant For: Past Surgical History:  Procedure Laterality Date  . CHOLECYSTECTOMY  2013  . WISDOM TOOTH EXTRACTION      Her  Family History Is Significant For: Family History  Problem Relation Age of Onset  . Hypertension Mother   . Arthritis Maternal Grandmother   . Hyperlipidemia Maternal Grandmother   . Hypertension Maternal Grandmother   . Throat cancer Maternal Grandfather   . Stroke Maternal Aunt   . Healthy Sister        x1  . Healthy Daughter        x1    Her Social History Is Significant For: Social History   Socioeconomic History  . Marital status: Single    Spouse name: None  . Number of children: None  . Years of education: None  . Highest education level: None  Social Needs  . Financial resource strain: None  . Food insecurity - worry: None  . Food insecurity - inability: None  . Transportation needs - medical: None  . Transportation needs - non-medical: None  Occupational History  . None  Tobacco Use  . Smoking status: Former Smoker    Last attempt to quit: 07/27/2012    Years since quitting: 4.9  . Smokeless tobacco: Never Used  Substance and Sexual Activity  . Alcohol use: Yes    Alcohol/week: 0.6 oz    Types: 1 Standard drinks or equivalent per week  . Drug use: No  . Sexual activity: Yes    Birth control/protection: None    Comment: men  Other Topics Concern  . None  Social History Narrative  . None    Her Allergies Are:  No Known Allergies:   Her Current Medications Are:  Outpatient Encounter Medications as of 07/10/2017  Medication Sig  . ALPRAZolam (XANAX) 0.5 MG tablet TAKE 1 TABLET BY MOUTH TWICE A DAY AS NEEDED FOR ANXIETY  . azelastine (ASTELIN) 0.1 % nasal spray Place 2 sprays into both nostrils 2 (two) times daily.  . Calcium Carb-Cholecalciferol (CALCIUM-VITAMIN D) 600-400 MG-UNIT TABS Take 1 each by mouth daily.  Marland Kitchen FLUoxetine (PROZAC) 40 MG capsule Take 1 capsule (40 mg total) by mouth daily.  . medroxyPROGESTERone (DEPO-PROVERA) 150 MG/ML injection Inject 150 mg into the muscle every 3 (three) months.  . Multiple Vitamin (MULTIVITAMIN) tablet Take 1  tablet by mouth daily.  . valACYclovir (VALTREX) 1000 MG tablet Take 1 tablet (1,000 mg total) by mouth daily. (Patient taking differently: Take 1,000 mg by mouth daily as needed. )  . [DISCONTINUED] doxycycline (VIBRA-TABS) 100 MG tablet Take 1 tablet (100 mg total) by mouth 2 (two) times daily.  . [DISCONTINUED] famciclovir (FAMVIR) 500 MG tablet Take 1 tablet (500 mg total) by mouth 3 (three) times daily.   No facility-administered encounter medications on file as of 07/10/2017.   :  Review of Systems:  Out of a complete 14 point review of systems, all are reviewed and negative with the exception of these symptoms as listed below: Review of Systems  Neurological:       Pt presents today to discuss her sleep. Pt doesn't think that she has a sleep problem. Pt has never had a sleep study but does endorse snoring.  Epworth Sleepiness Scale 0= would never doze 1= slight chance of dozing 2= moderate chance of dozing 3= high chance of dozing  Sitting and reading: 2 Watching TV: 2 Sitting inactive in a public place (ex. Theater or meeting): 0 As a passenger in a car for an hour without a break: 1 Lying down to rest in the afternoon: 2 Sitting and talking to someone: 0 Sitting quietly after lunch (no alcohol): 0 In a car, while stopped in traffic: 0 Total: 7     Objective:  Neurological Exam  Physical Exam Physical Examination:   Vitals:   07/10/17 1613  BP: 90/64  Pulse: 60    General Examination: The patient is a very pleasant 32 y.o. female in no acute distress. She appears well-developed and well-nourished and well groomed.   HEENT: Normocephalic, atraumatic, pupils are equal, round and reactive to light and accommodation. Extraocular tracking is good without limitation to gaze excursion or nystagmus noted. Normal smooth pursuit is noted. Hearing is grossly intact. Face is symmetric with normal facial animation and normal facial sensation. Speech is clear with no dysarthria  noted. There is no hypophonia. There is no lip, neck/head, jaw or voice tremor. Neck is supple with full range of passive and active motion. There are no carotid bruits on auscultation. Oropharynx exam reveals: mild mouth dryness, adequate dental hygiene and moderate airway crowding, due to smaller airway entry, tonsils are 2+, uvula thicker. Mallampati is class II. Neck circumference is 16-1/4 inch. She has a minimal overbite. Tongue protrudes centrally and palate elevates symmetrically.   Chest: Clear to auscultation without wheezing, rhonchi or crackles noted.  Heart: S1+S2+0, regular and normal without murmurs, rubs or gallops noted. Slow normal pulse, heart rate around upper 50s.  Abdomen: Soft, non-tender and non-distended with normal bowel sounds appreciated on auscultation.  Extremities: There is no pitting edema in the distal lower extremities bilaterally. Pedal pulses are intact.  Skin: Warm and dry  without trophic changes noted.  Musculoskeletal: exam reveals no obvious joint deformities, tenderness or joint swelling or erythema.   Neurologically:  Mental status: The patient is awake, alert and oriented in all 4 spheres. Her immediate and remote memory, attention, language skills and fund of knowledge are appropriate. There is no evidence of aphasia, agnosia, apraxia or anomia. Speech is clear with normal prosody and enunciation. Thought process is linear. Mood is normal and affect is normal.  Cranial nerves II - XII are as described above under HEENT exam. In addition: shoulder shrug is normal with equal shoulder height noted. Motor exam: Normal bulk, strength and tone is noted. There is no drift, tremor or rebound. Romberg is negative. Reflexes are 2+ throughout. Fine motor skills and coordination: intact with normal finger taps, normal hand movements, normal rapid alternating patting, normal foot taps and normal foot agility.  Cerebellar testing: No dysmetria or intention tremor on  finger to nose testing. Heel to shin is unremarkable bilaterally. There is no truncal or gait ataxia.  Sensory exam: intact to light touch in the upper and lower extremities.  Gait, station and balance: She stands easily. No veering to one side is noted. No leaning to one side is noted. Posture is age-appropriate and stance is narrow based. Gait shows normal stride length and normal pace. No problems turning are noted. Tandem walk is unremarkable.   Assessment and Plan:   In summary, Olivia Reyes is a very pleasant 32 y.o.-year old female with an underlying medical history of allergic rhinitis, anxiety, depression, and obesity, whose history and physical exam are concerning for obstructive sleep apnea (OSA). I had a long chat with the patient  about my findings and the diagnosis of OSA, its prognosis and treatment options. We talked about medical treatments, surgical interventions and non-pharmacological approaches. I explained in particular the risks and ramifications of untreated moderate to severe OSA, especially with respect to developing cardiovascular disease down the Road, including congestive heart failure, difficult to treat hypertension, cardiac arrhythmias, or stroke. Even type 2 diabetes has, in part, been linked to untreated OSA. Symptoms of untreated OSA include daytime sleepiness, memory problems, mood irritability and mood disorder such as depression and anxiety, lack of energy, as well as recurrent headaches, especially morning headaches. We talked about trying to maintain a healthy lifestyle in general, as well as the importance of weight control. I encouraged the patient to eat healthy, exercise daily and keep well hydrated, to keep a scheduled bedtime and wake time routine, to not skip any meals and eat healthy snacks in between meals. I advised the patient not to drive when feeling sleepy. I recommended the following at this time: sleep study with potential positive airway pressure  titration. (We will score hypopneas at 3%).   I explained the sleep test procedure to the patient and also outlined possible surgical and non-surgical treatment options of OSA, including the use of a custom-made dental device (which would require a referral to a specialist dentist or oral surgeon), upper airway surgical options, such as pillar implants, radiofrequency surgery, tongue base surgery, and UPPP (which would involve a referral to an ENT surgeon). Rarely, jaw surgery such as mandibular advancement may be considered.  I also explained the CPAP treatment option to the patient, who indicated that she would be willing to try CPAP if the need arises. I explained the importance of being compliant with PAP treatment, not only for insurance purposes but primarily to improve Her symptoms, and for the  patient's long term health benefit, including to reduce Her cardiovascular risks. I answered all her questions today and the patient was in agreement. I would like to see her back after the sleep study is completed and encouraged her to call with any interim questions, concerns, problems or updates.   Thank you very much for allowing me to participate in the care of this nice patient. If I can be of any further assistance to you please do not hesitate to call me at [phone].  Sincerely,   Star Age, MD, PhD

## 2017-07-10 NOTE — Patient Instructions (Addendum)

## 2017-07-16 ENCOUNTER — Telehealth: Payer: Self-pay | Admitting: Neurology

## 2017-07-16 NOTE — Telephone Encounter (Signed)
Spoke with patient today to schedule her for a sleep study, she stated that she does not want to be scheduled at the moment due to financial reasons. Did not specify a time frame.

## 2017-07-18 ENCOUNTER — Telehealth: Payer: Self-pay

## 2017-07-18 NOTE — Telephone Encounter (Signed)
error 

## 2017-08-08 DIAGNOSIS — Z3042 Encounter for surveillance of injectable contraceptive: Secondary | ICD-10-CM | POA: Diagnosis not present

## 2017-08-16 ENCOUNTER — Ambulatory Visit: Payer: BLUE CROSS/BLUE SHIELD | Admitting: Medical

## 2017-08-16 ENCOUNTER — Encounter: Payer: Self-pay | Admitting: Medical

## 2017-08-16 VITALS — BP 106/51 | HR 52 | Resp 16 | Ht 71.0 in | Wt 227.2 lb

## 2017-08-16 DIAGNOSIS — J029 Acute pharyngitis, unspecified: Secondary | ICD-10-CM | POA: Diagnosis not present

## 2017-08-16 LAB — POCT RAPID STREP A (OFFICE): RAPID STREP A SCREEN: NEGATIVE

## 2017-08-16 MED ORDER — AMOXICILLIN 875 MG PO TABS: 875.0000 mg | ORAL_TABLET | Freq: Two times a day (BID) | ORAL | 0 refills | Status: DC

## 2017-08-16 NOTE — Patient Instructions (Signed)
Your level of  throat pain and exam findings are consistent with probable strep despite the  negative result.   This can be falsely negative.     I did prescription of amoxicillin sent to your pharmacy.  Throat pain and mild headache would recommend ibuprofen.   You should feel gradually better with the antibiotic.   If you did not feel improvement with above treatment then would need to expand work up.  Follow up in 7 days or as needed

## 2017-08-16 NOTE — Progress Notes (Signed)
Subjective:    Patient ID: Olivia Reyes, female    DOB: [birth year], 31 y.o.   MRN: [id number]  HPI  Pt in with st since Monday.  Moderate pain on swallowing. Feels like strep. Past 2 nights low grade fever. 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  Pt has had some ha on and off. No diffuse bodyaches.  Pt is on depoprovera. Got injection last week.  Pt states pain level like when she had strep in the past.   Review of Systems  Constitutional: Positive for fatigue and fever. Negative for chills.  HENT: Positive for sore throat. Negative for congestion, postnasal drip, sinus pressure, sinus pain and trouble swallowing.   Respiratory: Negative for cough, choking and wheezing.   Cardiovascular: Negative for chest pain and palpitations.  Gastrointestinal: Negative for abdominal pain, constipation, nausea and rectal pain.  Musculoskeletal: Negative for back pain and gait problem.  Neurological: Positive for headaches. Negative for dizziness, seizures, syncope and weakness.       Low level l now after tylenol.  Hematological: Positive for adenopathy. Does not bruise/bleed easily.  Psychiatric/Behavioral: Negative for behavioral problems and confusion.   Past Medical History:  Diagnosis Date  . Anxiety   . Chicken pox   . Strep throat      Social History   Socioeconomic History  . Marital status: Single    Spouse name: Not on file  . Number of children: Not on file  . Years of education: Not on file  . Highest education level: Not on file  Occupational History  . Not on file  Social Needs  . Financial resource strain: Not on file  . Food insecurity:    Worry: Not on file    Inability: Not on file  . Transportation needs:    Medical: Not on file    Non-medical: Not on file  Tobacco Use  . Smoking status: Former Smoker    Last attempt to quit: 07/27/2012    Years since quitting: 5.0  . Smokeless tobacco: Never Used  Substance and Sexual Activity  . Alcohol use:  Yes    Alcohol/week: 0.6 oz    Types: 1 Standard drinks or equivalent per week  . Drug use: No  . Sexual activity: Yes    Birth control/protection: None    Comment: men  Lifestyle  . Physical activity:    Days per week: Not on file    Minutes per session: Not on file  . Stress: Not on file  Relationships  . Social connections:    Talks on phone: Not on file    Gets together: Not on file    Attends religious service: Not on file    Active member of club or organization: Not on file    Attends meetings of clubs or organizations: Not on file    Relationship status: Not on file  . Intimate partner violence:    Fear of current or ex partner: Not on file    Emotionally abused: Not on file    Physically abused: Not on file    Forced sexual activity: Not on file  Other Topics Concern  . Not on file  Social History Narrative  . Not on file    Past Surgical History:  Procedure Laterality Date  . CHOLECYSTECTOMY  2013  . WISDOM TOOTH EXTRACTION      Family History  Problem Relation Age of Onset  . Hypertension Mother   . Arthritis  Maternal Grandmother   . Hyperlipidemia Maternal Grandmother   . Hypertension Maternal Grandmother   . Throat cancer Maternal Grandfather   . Stroke Maternal Aunt   . Healthy Sister        x1  . Healthy Daughter        x1    No Known Allergies  Current Outpatient Medications on File Prior to Visit  Medication Sig Dispense Refill  . ALPRAZolam (XANAX) 0.5 MG tablet TAKE 1 TABLET BY MOUTH TWICE A DAY AS NEEDED FOR ANXIETY 30 tablet 1  . Calcium Carb-Cholecalciferol (CALCIUM-VITAMIN D) 600-400 MG-UNIT TABS Take 1 each by mouth daily.    Marland Kitchen FLUoxetine (PROZAC) 40 MG capsule Take 1 capsule (40 mg total) by mouth daily. 90 capsule 3  . medroxyPROGESTERone (DEPO-PROVERA) 150 MG/ML injection Inject 150 mg into the muscle every 3 (three) months.    . Multiple Vitamin (MULTIVITAMIN) tablet Take 1 tablet by mouth daily.    . valACYclovir (VALTREX) 1000  MG tablet Take 1 tablet (1,000 mg total) by mouth daily. (Patient taking differently: Take 1,000 mg by mouth daily as needed. ) 10 tablet 1   No current facility-administered medications on file prior to visit.     BP (!) 106/51 (BP Location: Right Arm, Patient Position: Sitting, Cuff Size: Large)   Pulse (!) 52   Resp 16   Ht 5\' 11"  (1.803 m)   Wt 227 lb 3.2 oz (103.1 kg)   SpO2 100%   BMI 31.69 kg/m       Objective:   Physical Exam  General  Mental Status - Alert. General Appearance - Well groomed. Not in acute distress.  Skin Rashes- No Rashes.  HEENT Head- Normal. Ear Auditory Canal - Left- Normal. Right - Normal.Tympanic Membrane- Left- Normal. Right- Normal. Eye Sclera/Conjunctiva- Left- Normal. Right- Normal. Nose & Sinuses Nasal Mucosa- Left-  Boggy and Congested. Right-  Boggy and  Congested.Bilateral maxillary and frontal sinus pressure. Mouth & Throat Lips: Upper Lip- Normal: no dryness, cracking, pallor, cyanosis, or vesicular eruption. Lower Lip-Normal: no dryness, cracking, pallor, cyanosis or vesicular eruption. Buccal Mucosa- Bilateral- No Aphthous ulcers. Oropharynx- No Discharge or Erythema. Tonsils: Characteristics- Bilateral- bright red Erythema. Size/Enlargement- Bilateral- 1-2 + enlargement. Discharge- bilateral-None.  Neck Neck- Supple. No Masses.moderate enlarged submandibular node and tender.   Chest and Lung Exam Auscultation: Breath Sounds:-Clear even and unlabored.  Cardiovascular Auscultation:Rythm- Regular, rate and rhythm. Murmurs & Other Heart Sounds:Ausculatation of the heart reveal- No Murmurs.  Lymphatic Head & Neck General Head & Neck Lymphatics: Bilateral: Description- see neck exam .       Assessment & Plan:  Your level of  throat pain and exam findings are consistent with probable strep despite the  negative result.   This can be falsely negative.     I did prescription of amoxicillin sent to your pharmacy.  Throat  pain and mild headache would recommend ibuprofen.   You should feel gradually better with the antibiotic.   If you did not feel improvement with above treatment then would need to expand work up.  Follow up in 7 days or as needed  [employer], [employer]

## 2017-09-03 ENCOUNTER — Emergency Department (HOSPITAL_BASED_OUTPATIENT_CLINIC_OR_DEPARTMENT_OTHER): Payer: BLUE CROSS/BLUE SHIELD

## 2017-09-03 ENCOUNTER — Emergency Department (HOSPITAL_BASED_OUTPATIENT_CLINIC_OR_DEPARTMENT_OTHER)
Admission: EM | Admit: 2017-09-03 | Discharge: 2017-09-03 | Disposition: A | Discharge status: Home / Self Care | Payer: BLUE CROSS/BLUE SHIELD | Attending: Emergency Medicine | Admitting: Emergency Medicine

## 2017-09-03 ENCOUNTER — Encounter (HOSPITAL_BASED_OUTPATIENT_CLINIC_OR_DEPARTMENT_OTHER): Payer: Self-pay

## 2017-09-03 DIAGNOSIS — W260XXA Contact with knife, initial encounter: Secondary | ICD-10-CM | POA: Insufficient documentation

## 2017-09-03 DIAGNOSIS — S6992XA Unspecified injury of left wrist, hand and finger(s), initial encounter: Secondary | ICD-10-CM | POA: Diagnosis not present

## 2017-09-03 DIAGNOSIS — Y93G1 Activity, food preparation and clean up: Secondary | ICD-10-CM | POA: Insufficient documentation

## 2017-09-03 DIAGNOSIS — Z87891 Personal history of nicotine dependence: Secondary | ICD-10-CM | POA: Insufficient documentation

## 2017-09-03 DIAGNOSIS — Y998 Other external cause status: Secondary | ICD-10-CM | POA: Diagnosis not present

## 2017-09-03 DIAGNOSIS — Z79899 Other long term (current) drug therapy: Secondary | ICD-10-CM | POA: Diagnosis not present

## 2017-09-03 DIAGNOSIS — R200 Anesthesia of skin: Secondary | ICD-10-CM | POA: Diagnosis not present

## 2017-09-03 DIAGNOSIS — S61412A Laceration without foreign body of left hand, initial encounter: Secondary | ICD-10-CM | POA: Diagnosis not present

## 2017-09-03 DIAGNOSIS — Z23 Encounter for immunization: Secondary | ICD-10-CM | POA: Diagnosis not present

## 2017-09-03 DIAGNOSIS — Y929 Unspecified place or not applicable: Secondary | ICD-10-CM | POA: Diagnosis not present

## 2017-09-03 MED ORDER — LIDOCAINE HCL 2 % IJ SOLN
10.0000 mL | Freq: Once | INTRAMUSCULAR | Status: AC
  Administered 2017-09-03: 200 mg via INTRADERMAL
  Filled 2017-09-03: qty 20

## 2017-09-03 MED ORDER — HYDROCODONE-ACETAMINOPHEN 5-325 MG PO TABS: 1.0000 | ORAL_TABLET | Freq: Four times a day (QID) | ORAL | 0 refills | Status: DC | PRN

## 2017-09-03 MED ORDER — TETANUS-DIPHTH-ACELL PERTUSSIS 5-2.5-18.5 LF-MCG/0.5 IM SUSP
0.5000 mL | Freq: Once | INTRAMUSCULAR | Status: AC
  Administered 2017-09-03: 0.5 mL via INTRAMUSCULAR
  Filled 2017-09-03: qty 0.5

## 2017-09-03 MED ORDER — HYDROCODONE-ACETAMINOPHEN 5-325 MG PO TABS
2.0000 | ORAL_TABLET | Freq: Once | ORAL | Status: AC
  Administered 2017-09-03: 2 via ORAL
  Filled 2017-09-03: qty 2

## 2017-09-03 NOTE — ED Provider Notes (Signed)
[facility] Provider Note   CSN: [id number] Arrival date & time: 09/03/17  0803     History   Chief Complaint Chief Complaint  Patient presents with  . Laceration    HPI Olivia Reyes is a 32 y.o. female history of anxiety here presenting with laceration to the left hand.  Patient states that she was rushing to get to work and was trying to slice a avocad to prepare her salad and then accidentally punctured the left second finger web space with a knife.  She states that the knife is clean prior to cutting the avocado and is not rusty.  Patient states that her tetanus is not up-to-date.  She is complaining of some numbness to the left third finger as well as the palm of her hand. Denies any other injuries.   The history is provided by the patient.    Past Medical History:  Diagnosis Date  . Anxiety   . Chicken pox   . Strep throat     Patient Active Problem List   Diagnosis Date Noted  . Rash and nonspecific skin eruption 11/15/2015  . Visit for preventive health examination 11/11/2015  . Obesity 11/11/2015  . Shingles rash 05/12/2014  . Anxiety 07/27/2013  . Abnormal Pap smear of cervix 07/27/2013    Past Surgical History:  Procedure Laterality Date  . CHOLECYSTECTOMY  2013  . WISDOM TOOTH EXTRACTION       OB History   None      Home Medications    Prior to Admission medications   Medication Sig Start Date End Date Taking? Authorizing Provider  ALPRAZolam (XANAX) 0.5 MG tablet TAKE 1 TABLET BY MOUTH TWICE A DAY AS NEEDED FOR ANXIETY 11/08/16   Copland, Gay Filler, MD  amoxicillin (AMOXIL) 875 MG tablet Take 1 tablet (875 mg total) by mouth 2 (two) times daily. 08/16/17   Saguier, Percell Miller, PA-C  Calcium Carb-Cholecalciferol (CALCIUM-VITAMIN D) 600-400 MG-UNIT TABS Take 1 each by mouth daily.    [provider]  FLUoxetine (PROZAC) 40 MG capsule Take 1 capsule (40 mg total) by mouth daily. 11/08/16   Copland, Gay Filler, MD    medroxyPROGESTERone (DEPO-PROVERA) 150 MG/ML injection Inject 150 mg into the muscle every 3 (three) months.    [provider]  Multiple Vitamin (MULTIVITAMIN) tablet Take 1 tablet by mouth daily.    [provider]  valACYclovir (VALTREX) 1000 MG tablet Take 1 tablet (1,000 mg total) by mouth daily. Patient taking differently: Take 1,000 mg by mouth daily as needed.  05/31/17   Campbell Riches, MD    Family History Family History  Problem Relation Age of Onset  . Hypertension Mother   . Arthritis Maternal Grandmother   . Hyperlipidemia Maternal Grandmother   . Hypertension Maternal Grandmother   . Throat cancer Maternal Grandfather   . Stroke Maternal Aunt   . Healthy Sister        x1  . Healthy Daughter        x1    Social History Social History   Tobacco Use  . Smoking status: Former Smoker    Last attempt to quit: 07/27/2012    Years since quitting: 5.1  . Smokeless tobacco: Never Used  Substance Use Topics  . Alcohol use: Yes    Alcohol/week: 0.6 oz    Types: 1 Standard drinks or equivalent per week  . Drug use: No     Allergies   Patient has no known  allergies.   Review of Systems Review of Systems  Skin: Positive for wound.  All other systems reviewed and are negative.    Physical Exam Updated Vital Signs BP 125/71 (BP Location: Right Arm)   Pulse 75   Temp 98.1 F (36.7 C) (Oral)   Resp 18   SpO2 99%   Physical Exam  Constitutional: She appears well-developed.  HENT:  Head: Normocephalic.  Eyes: Pupils are equal, round, and reactive to light.  Neck: Normal range of motion.  Cardiovascular: Normal rate.  Pulmonary/Chest: Effort normal.  Abdominal: Soft.  Musculoskeletal:  L webspace between the 2nd and 3rd digit there is 2 cm laceration there. There is some local swelling, no obvious exposed tendons. Slightly dec sensation L third finger but able to flex and extend finger and there is normally capillary refill. All other  fingers has nl neurovascular exam. 2+ radial pulse   Neurological: She is alert.  Skin: Skin is warm.  See above regarding laceration   Psychiatric: She has a normal mood and affect.  Nursing note and vitals reviewed.    ED Treatments / Results  Labs (all labs ordered are listed, but only abnormal results are displayed) Labs Reviewed - No data to display  EKG None  Radiology No results found.  Procedures Procedures (including critical care time)  LACERATION REPAIR Performed by: Wandra Arthurs Authorized by: Wandra Arthurs Consent: Verbal consent obtained. Risks and benefits: risks, benefits and alternatives were discussed Consent given by: patient Patient identity confirmed: provided demographic data Prepped and Draped in normal sterile fashion Wound explored  Laceration Location: L palm  Laceration Length: 2 cm  No Foreign Bodies seen or palpated  Anesthesia: local infiltration  Local anesthetic: lidocaine 2 % no epinephrine  Anesthetic total: 5 ml  Irrigation method: syringe Amount of cleaning: standard  Skin closure: 5-0 ethilon  Number of sutures: 3  Technique: simple interrupted   Patient tolerance: Patient tolerated the procedure well with no immediate complications.   Medications Ordered in ED Medications  lidocaine (XYLOCAINE) 2 % (with pres) injection 200 mg (has no administration in time range)  Tdap (BOOSTRIX) injection 0.5 mL (has no administration in time range)  HYDROcodone-acetaminophen (NORCO/VICODIN) 5-325 MG per tablet 2 tablet (has no administration in time range)     Initial Impression / Assessment and Plan / ED Course  I have reviewed the triage vital signs and the nursing notes.  Pertinent labs & imaging results that were available during my care of the patient were reviewed by me and considered in my medical decision making (see chart for details).     Olivia Reyes is a 32 y.o. female here with L second webspace injury with a  knife. Will update tdap. Some subjective numbness of the L third finger but able to flex and extend and has normal capillary refill. Will get xray to r/o fracture. Will numb the wound and explore it.   9:43 AM Wound explored under local anesthesia. No obvious tendons visible. Swelling progressed and she had more numbness. I think the numbness likely from swelling vs nerve injury. Laceration sutured, tdap updated. Suture removal in a week, referred to Dr. Fredna Dow for follow up given numbness.    Final Clinical Impressions(s) / ED Diagnoses   Final diagnoses:  None    ED Discharge Orders    None       Drenda Freeze, MD 09/03/17 [phone]

## 2017-09-03 NOTE — Discharge Instructions (Signed)
Take motrin for pain   Take vicodin for severe pain.   Keep dressing in place for 24 hrs and then you can just put bandaid on. Apply ice for 48 hrs for swelling.   Your numbness is likely from the swelling. Please call Dr. Levell July office today for follow up for suture removal in a week and for further evaluation. If you are still numb at that time, he will instruct you regarding what to do next   You need suture removal in a week   Return to ER earlier if you have redness around the wound, purulent discharge, uncontrolled pain, fever

## 2017-09-03 NOTE — ED Triage Notes (Signed)
Pt states cutting an avocado and the tip of the pairing knife punctured lt palm of hand, numbness to hand, fingers and wrist

## 2017-09-09 ENCOUNTER — Other Ambulatory Visit: Payer: Self-pay | Admitting: Orthopedic Surgery

## 2017-09-09 DIAGNOSIS — S65513A Laceration of blood vessel of left middle finger, initial encounter: Secondary | ICD-10-CM | POA: Diagnosis not present

## 2017-09-09 DIAGNOSIS — S64493A Injury of digital nerve of left middle finger, initial encounter: Secondary | ICD-10-CM | POA: Diagnosis not present

## 2017-09-10 ENCOUNTER — Other Ambulatory Visit: Payer: Self-pay

## 2017-09-10 ENCOUNTER — Encounter (HOSPITAL_BASED_OUTPATIENT_CLINIC_OR_DEPARTMENT_OTHER): Payer: Self-pay | Admitting: *Deleted

## 2017-09-12 ENCOUNTER — Encounter (HOSPITAL_BASED_OUTPATIENT_CLINIC_OR_DEPARTMENT_OTHER)
Admission: RE | Disposition: A | Discharge status: Home / Self Care | Payer: Self-pay | Source: Ambulatory Visit | Attending: Orthopedic Surgery

## 2017-09-12 ENCOUNTER — Other Ambulatory Visit: Payer: Self-pay

## 2017-09-12 ENCOUNTER — Encounter (HOSPITAL_BASED_OUTPATIENT_CLINIC_OR_DEPARTMENT_OTHER): Payer: Self-pay | Admitting: *Deleted

## 2017-09-12 ENCOUNTER — Ambulatory Visit (HOSPITAL_BASED_OUTPATIENT_CLINIC_OR_DEPARTMENT_OTHER): Payer: BLUE CROSS/BLUE SHIELD | Admitting: Anesthesiology

## 2017-09-12 ENCOUNTER — Ambulatory Visit (HOSPITAL_BASED_OUTPATIENT_CLINIC_OR_DEPARTMENT_OTHER)
Admission: RE | Admit: 2017-09-12 | Discharge: 2017-09-12 | Disposition: A | Discharge status: Home / Self Care | Payer: BLUE CROSS/BLUE SHIELD | Source: Ambulatory Visit | Attending: Orthopedic Surgery | Admitting: Orthopedic Surgery

## 2017-09-12 DIAGNOSIS — Y93G1 Activity, food preparation and clean up: Secondary | ICD-10-CM | POA: Insufficient documentation

## 2017-09-12 DIAGNOSIS — F419 Anxiety disorder, unspecified: Secondary | ICD-10-CM | POA: Diagnosis not present

## 2017-09-12 DIAGNOSIS — R208 Other disturbances of skin sensation: Secondary | ICD-10-CM | POA: Insufficient documentation

## 2017-09-12 DIAGNOSIS — S61412A Laceration without foreign body of left hand, initial encounter: Secondary | ICD-10-CM | POA: Insufficient documentation

## 2017-09-12 DIAGNOSIS — W458XXA Other foreign body or object entering through skin, initial encounter: Secondary | ICD-10-CM | POA: Diagnosis not present

## 2017-09-12 DIAGNOSIS — R21 Rash and other nonspecific skin eruption: Secondary | ICD-10-CM | POA: Diagnosis not present

## 2017-09-12 HISTORY — PX: WOUND EXPLORATION: SHX6188

## 2017-09-12 SURGERY — WOUND EXPLORATION
Anesthesia: General | Site: Hand | Laterality: Left

## 2017-09-12 MED ORDER — HYDROCODONE-ACETAMINOPHEN 5-325 MG PO TABS: ORAL_TABLET | ORAL | 0 refills | Status: DC

## 2017-09-12 MED ORDER — FENTANYL CITRATE (PF) 100 MCG/2ML IJ SOLN
INTRAMUSCULAR | Status: AC
  Filled 2017-09-12: qty 2

## 2017-09-12 MED ORDER — ONDANSETRON HCL 4 MG/2ML IJ SOLN
INTRAMUSCULAR | Status: AC
  Filled 2017-09-12: qty 2

## 2017-09-12 MED ORDER — CEFAZOLIN SODIUM-DEXTROSE 2-4 GM/100ML-% IV SOLN: 2.0000 g | INTRAVENOUS | Status: DC

## 2017-09-12 MED ORDER — HYDROMORPHONE HCL 1 MG/ML IJ SOLN: 0.2500 mg | INTRAMUSCULAR | Status: DC | PRN

## 2017-09-12 MED ORDER — PROMETHAZINE HCL 25 MG/ML IJ SOLN: 6.2500 mg | INTRAMUSCULAR | Status: DC | PRN

## 2017-09-12 MED ORDER — FENTANYL CITRATE (PF) 100 MCG/2ML IJ SOLN
50.0000 ug | INTRAMUSCULAR | Status: DC | PRN
  Administered 2017-09-12: 100 ug via INTRAVENOUS

## 2017-09-12 MED ORDER — HYDROCODONE-ACETAMINOPHEN 7.5-325 MG PO TABS: 1.0000 | ORAL_TABLET | Freq: Once | ORAL | Status: DC | PRN

## 2017-09-12 MED ORDER — PROPOFOL 10 MG/ML IV BOLUS
INTRAVENOUS | Status: DC | PRN
  Administered 2017-09-12: 200 mg via INTRAVENOUS

## 2017-09-12 MED ORDER — BUPIVACAINE HCL (PF) 0.25 % IJ SOLN
INTRAMUSCULAR | Status: DC | PRN
  Administered 2017-09-12: 8 mL

## 2017-09-12 MED ORDER — ACETAMINOPHEN 10 MG/ML IV SOLN: 1000.0000 mg | Freq: Once | INTRAVENOUS | Status: DC | PRN

## 2017-09-12 MED ORDER — MIDAZOLAM HCL 2 MG/2ML IJ SOLN
INTRAMUSCULAR | Status: AC
  Filled 2017-09-12: qty 2

## 2017-09-12 MED ORDER — SCOPOLAMINE 1 MG/3DAYS TD PT72: 1.0000 | MEDICATED_PATCH | Freq: Once | TRANSDERMAL | Status: DC | PRN

## 2017-09-12 MED ORDER — MIDAZOLAM HCL 2 MG/2ML IJ SOLN
1.0000 mg | INTRAMUSCULAR | Status: DC | PRN
  Administered 2017-09-12: 1 mg via INTRAVENOUS

## 2017-09-12 MED ORDER — LIDOCAINE HCL (CARDIAC) PF 100 MG/5ML IV SOSY
PREFILLED_SYRINGE | INTRAVENOUS | Status: AC
  Filled 2017-09-12: qty 5

## 2017-09-12 MED ORDER — DEXAMETHASONE SODIUM PHOSPHATE 10 MG/ML IJ SOLN
INTRAMUSCULAR | Status: AC
  Filled 2017-09-12: qty 1

## 2017-09-12 MED ORDER — LACTATED RINGERS IV SOLN
INTRAVENOUS | Status: DC
  Administered 2017-09-12 (×2): via INTRAVENOUS

## 2017-09-12 MED ORDER — PROPOFOL 10 MG/ML IV BOLUS
INTRAVENOUS | Status: AC
  Filled 2017-09-12: qty 20

## 2017-09-12 MED ORDER — HYDROMORPHONE HCL 1 MG/ML IJ SOLN
INTRAMUSCULAR | Status: AC
  Filled 2017-09-12: qty 0.5

## 2017-09-12 MED ORDER — HYDROMORPHONE HCL 1 MG/ML IJ SOLN
0.2500 mg | INTRAMUSCULAR | Status: DC | PRN
  Administered 2017-09-12 (×2): 0.5 mg via INTRAVENOUS

## 2017-09-12 MED ORDER — MEPERIDINE HCL 25 MG/ML IJ SOLN: 6.2500 mg | INTRAMUSCULAR | Status: DC | PRN

## 2017-09-12 MED ORDER — CHLORHEXIDINE GLUCONATE 4 % EX LIQD: 60.0000 mL | Freq: Once | CUTANEOUS | Status: DC

## 2017-09-12 MED ORDER — CEFAZOLIN SODIUM-DEXTROSE 2-4 GM/100ML-% IV SOLN
INTRAVENOUS | Status: AC
  Filled 2017-09-12: qty 100

## 2017-09-12 MED ORDER — DEXAMETHASONE SODIUM PHOSPHATE 10 MG/ML IJ SOLN
INTRAMUSCULAR | Status: DC | PRN
  Administered 2017-09-12: 10 mg via INTRAVENOUS

## 2017-09-12 MED ORDER — CEFAZOLIN SODIUM-DEXTROSE 2-3 GM-%(50ML) IV SOLR
INTRAVENOUS | Status: DC | PRN
  Administered 2017-09-12: 2 g via INTRAVENOUS

## 2017-09-12 MED ORDER — ONDANSETRON HCL 4 MG/2ML IJ SOLN
INTRAMUSCULAR | Status: DC | PRN
  Administered 2017-09-12: 4 mg via INTRAVENOUS

## 2017-09-12 SURGICAL SUPPLY — 61 items
BAG DECANTER FOR FLEXI CONT (MISCELLANEOUS) IMPLANT
BANDAGE ACE 3X5.8 VEL STRL LF (GAUZE/BANDAGES/DRESSINGS) ×3 IMPLANT
BLADE MINI RND TIP GREEN BEAV (BLADE) IMPLANT
BLADE SURG 15 STRL LF DISP TIS (BLADE) ×4 IMPLANT
BLADE SURG 15 STRL SS (BLADE) ×2
BNDG ESMARK 4X9 LF (GAUZE/BANDAGES/DRESSINGS) IMPLANT
BNDG GAUZE ELAST 4 BULKY (GAUZE/BANDAGES/DRESSINGS) ×3 IMPLANT
BRUSH SCRUB EZ PLAIN DRY (MISCELLANEOUS) ×3 IMPLANT
CATH ROBINSON RED A/P 10FR (CATHETERS) IMPLANT
CHLORAPREP W/TINT 26ML (MISCELLANEOUS) ×3 IMPLANT
CORD BIPOLAR FORCEPS 12FT (ELECTRODE) ×3 IMPLANT
COVER BACK TABLE 60X90IN (DRAPES) ×3 IMPLANT
COVER MAYO STAND STRL (DRAPES) ×3 IMPLANT
CUFF TOURNIQUET SINGLE 18IN (TOURNIQUET CUFF) ×3 IMPLANT
DECANTER SPIKE VIAL GLASS SM (MISCELLANEOUS) ×3 IMPLANT
DRAPE EXTREMITY T 121X128X90 (DRAPE) ×3 IMPLANT
DRAPE SURG 17X23 STRL (DRAPES) ×3 IMPLANT
GAUZE SPONGE 4X4 12PLY STRL (GAUZE/BANDAGES/DRESSINGS) ×3 IMPLANT
GAUZE XEROFORM 1X8 LF (GAUZE/BANDAGES/DRESSINGS) ×3 IMPLANT
GLOVE BIO SURGEON STRL SZ7.5 (GLOVE) ×3 IMPLANT
GLOVE BIOGEL PI IND STRL 7.0 (GLOVE) ×2 IMPLANT
GLOVE BIOGEL PI IND STRL 8 (GLOVE) ×4 IMPLANT
GLOVE BIOGEL PI IND STRL 8.5 (GLOVE) ×8 IMPLANT
GLOVE BIOGEL PI INDICATOR 7.0 (GLOVE) ×1
GLOVE BIOGEL PI INDICATOR 8 (GLOVE) ×2
GLOVE BIOGEL PI INDICATOR 8.5 (GLOVE) ×4
GLOVE SURG ORTHO 8.0 STRL STRW (GLOVE) ×3 IMPLANT
GOWN STRL REUS W/ TWL LRG LVL3 (GOWN DISPOSABLE) ×4 IMPLANT
GOWN STRL REUS W/TWL LRG LVL3 (GOWN DISPOSABLE) ×2
GOWN STRL REUS W/TWL XL LVL3 (GOWN DISPOSABLE) ×6 IMPLANT
LOOP VESSEL MAXI BLUE (MISCELLANEOUS) IMPLANT
NDL SAFETY ECLIPSE 18X1.5 (NEEDLE) IMPLANT
NEEDLE HYPO 18GX1.5 SHARP (NEEDLE)
NEEDLE HYPO 25X1 1.5 SAFETY (NEEDLE) ×3 IMPLANT
NS IRRIG 1000ML POUR BTL (IV SOLUTION) ×3 IMPLANT
PACK BASIN DAY SURGERY FS (CUSTOM PROCEDURE TRAY) ×3 IMPLANT
PAD CAST 3X4 CTTN HI CHSV (CAST SUPPLIES) ×2 IMPLANT
PAD CAST 4YDX4 CTTN HI CHSV (CAST SUPPLIES) IMPLANT
PADDING CAST ABS 4INX4YD NS (CAST SUPPLIES) ×1
PADDING CAST ABS COTTON 4X4 ST (CAST SUPPLIES) ×2 IMPLANT
PADDING CAST COTTON 3X4 STRL (CAST SUPPLIES) ×1
PADDING CAST COTTON 4X4 STRL (CAST SUPPLIES)
SLEEVE SCD COMPRESS KNEE MED (MISCELLANEOUS) ×3 IMPLANT
SPEAR EYE SURG WECK-CEL (MISCELLANEOUS) ×3 IMPLANT
SPLINT PLASTER CAST XFAST 3X15 (CAST SUPPLIES) IMPLANT
SPLINT PLASTER XTRA FASTSET 3X (CAST SUPPLIES)
STOCKINETTE 4X48 STRL (DRAPES) ×3 IMPLANT
SUT ETHIBOND 3-0 V-5 (SUTURE) IMPLANT
SUT ETHILON 4 0 PS 2 18 (SUTURE) ×3 IMPLANT
SUT FIBERWIRE 4-0 18 TAPR NDL (SUTURE)
SUT NYLON 9 0 VRM6 (SUTURE) IMPLANT
SUT PROLENE 6 0 P 1 18 (SUTURE) IMPLANT
SUT SILK 4 0 PS 2 (SUTURE) IMPLANT
SUT SUPRAMID 4-0 (SUTURE) IMPLANT
SUT VICRYL 4-0 PS2 18IN ABS (SUTURE) IMPLANT
SUTURE FIBERWR 4-0 18 TAPR NDL (SUTURE) IMPLANT
SYR BULB 3OZ (MISCELLANEOUS) ×3 IMPLANT
SYR CONTROL 10ML LL (SYRINGE) IMPLANT
TOWEL OR 17X24 6PK STRL BLUE (TOWEL DISPOSABLE) ×6 IMPLANT
TRAY DSU PREP LF (CUSTOM PROCEDURE TRAY) ×3 IMPLANT
UNDERPAD 30X30 (UNDERPADS AND DIAPERS) ×3 IMPLANT

## 2017-09-12 NOTE — Op Note (Signed)
[id number] 

## 2017-09-12 NOTE — Op Note (Signed)
I assisted Surgeon(s) and Role:    Leanora Cover, MD - Primary on the Procedure(s): LEFT HAND EXPLORATION REPAIR [place] on 09/12/2017.  I provided assistance on this case as follows: setup, approach, exploration closure a d application of the dressings. Electronically signed by: Wynonia Sours, MD Date: 09/12/2017 Time: 4:23 PM

## 2017-09-12 NOTE — Transfer of Care (Signed)
Immediate Anesthesia Transfer of Care Note  Patient: Olivia Reyes  Procedure(s) Performed: LEFT HAND EXPLORATION REPAIR TENDON,ARTERY,NERVE A NECESSARY (Left )  Patient Location: PACU  Anesthesia Type:General  Level of Consciousness: awake, alert  and oriented  Airway & Oxygen Therapy: Patient Spontanous Breathing and Patient connected to face mask oxygen  Post-op Assessment: Report given to RN and Post -op Vital signs reviewed and stable  Post vital signs: Reviewed and stable  Last Vitals:  Vitals Value Taken Time  BP    Temp    Pulse 47 09/12/2017  4:29 PM  Resp 15 09/12/2017  4:29 PM  SpO2 97 % 09/12/2017  4:29 PM  Vitals shown include unvalidated device data.  Last Pain:  Vitals:   09/12/17 1235  PainSc: 2       Patients Stated Pain Goal: 1 (33/82/50 5397)  Complications: No apparent anesthesia complications

## 2017-09-12 NOTE — H&P (Signed)
  Olivia Reyes is an 32 y.o. female.   Chief Complaint: left hand laceration HPI: 32 yo female states she sustained laceration to left hand while trying to remove avocado pit last week.  Seen in ED where wound cleaned and sutured.  Followed up in office.  She reports numbness in index and long fingers.  She wishes to undergo operative exploration with repair as necessary.  Allergies: No Known Allergies  Past Medical History:  Diagnosis Date  . Anxiety   . Chicken pox   . Strep throat     Past Surgical History:  Procedure Laterality Date  . CHOLECYSTECTOMY  2013  . WISDOM TOOTH EXTRACTION      Family History: Family History  Problem Relation Age of Onset  . Hypertension Mother   . Arthritis Maternal Grandmother   . Hyperlipidemia Maternal Grandmother   . Hypertension Maternal Grandmother   . Throat cancer Maternal Grandfather   . Stroke Maternal Aunt   . Healthy Sister        x1  . Healthy Daughter        x1    Social History:   reports that she quit smoking about 5 years ago. She has never used smokeless tobacco. She reports that she drinks about 0.6 oz of alcohol per week. She reports that she does not use drugs.  Medications: No medications prior to admission.    No results found for this or any previous visit (from the past 48 hour(s)).  No results found.   A comprehensive review of systems was negative.  Height 5\' 11"  (1.803 m), weight 103 kg (227 lb).  General appearance: alert, cooperative and appears stated age Head: Normocephalic, without obvious abnormality, atraumatic Neck: supple, symmetrical, trachea midline Cardio: regular rate and rhythm Resp: clear to auscultation bilaterally Extremities: Intact sensation and capillary refill all digits except left index and long fingers with decreased sensation.  +epl/fpl/io.  Wound between mp joints of index and long fingers volarly.  Sutured in place. Pulses: 2+ and symmetric Skin: Skin color, texture,  turgor normal. No rashes or lesions Neurologic: Grossly normal Incision/Wound: as above  Assessment/Plan Left hand laceration with possible nerve laceration.  Non operative and operative treatment options were discussed with the patient and patient wishes to proceed with operative treatment. Risks, benefits, and alternatives of surgery were discussed and the patient agrees with the plan of care.   Shamicka Inga R 09/12/2017, 8:33 AM

## 2017-09-12 NOTE — Anesthesia Preprocedure Evaluation (Signed)
Anesthesia Evaluation  Patient identified by MRN, date of birth, ID band Patient awake    Reviewed: Allergy & Precautions, NPO status , Patient's Chart, lab work & pertinent test results  Airway Mallampati: II  TM Distance: >3 FB Neck ROM: Full    Dental no notable dental hx.    Pulmonary neg pulmonary ROS, former smoker,    Pulmonary exam normal breath sounds clear to auscultation       Cardiovascular negative cardio ROS Normal cardiovascular exam Rhythm:Regular Rate:Normal     Neuro/Psych negative neurological ROS     GI/Hepatic negative GI ROS, Neg liver ROS,   Endo/Other  negative endocrine ROS  Renal/GU negative Renal ROS  negative genitourinary   Musculoskeletal negative musculoskeletal ROS (+)   Abdominal   Peds negative pediatric ROS (+)  Hematology negative hematology ROS (+)   Anesthesia Other Findings   Reproductive/Obstetrics negative OB ROS                             Anesthesia Physical Anesthesia Plan  ASA: I  Anesthesia Plan: General   Post-op Pain Management:    Induction:   PONV Risk Score and Plan: Treatment may vary due to age or medical condition  Airway Management Planned: LMA  Additional Equipment:   Intra-op Plan:   Post-operative Plan:   Informed Consent: I have reviewed the patients History and Physical, chart, labs and discussed the procedure including the risks, benefits and alternatives for the proposed anesthesia with the patient or authorized representative who has indicated his/her understanding and acceptance.     Plan Discussed with: CRNA  Anesthesia Plan Comments:         Anesthesia Quick Evaluation

## 2017-09-12 NOTE — Anesthesia Procedure Notes (Signed)
Procedure Name: LMA Insertion Performed by: Verita Lamb, CRNA Pre-anesthesia Checklist: Patient identified, Emergency Drugs available, Suction available, Patient being monitored and Timeout performed Patient Re-evaluated:Patient Re-evaluated prior to induction Oxygen Delivery Method: Circle system utilized Induction Type: IV induction LMA: LMA inserted LMA Size: 4.0 Tube type: Oral Placement Confirmation: positive ETCO2,  CO2 detector and breath sounds checked- equal and bilateral Tube secured with: Tape Dental Injury: Teeth and Oropharynx as per pre-operative assessment

## 2017-09-12 NOTE — Brief Op Note (Signed)
09/12/2017  4:23 PM  PATIENT:  Olivia Reyes  32 y.o. female  PRE-OPERATIVE DIAGNOSIS:  LEFT HAND LACERATION POSSIBLE ARTERY / NERVE LACERATION  POST-OPERATIVE DIAGNOSIS:  POSSIBLE ARTERY / NERVE LACERATION  PROCEDURE:  Procedure(s): LEFT HAND EXPLORATION REPAIR TENDON,ARTERY,NERVE A NECESSARY (Left)  SURGEON:  Surgeon(s) and Role:    * Leanora Cover, MD - Primary  PHYSICIAN ASSISTANT:   ASSISTANTS: Daryll Brod, MD   ANESTHESIA:   general  EBL:  30 mL   BLOOD ADMINISTERED:none  DRAINS: none   LOCAL MEDICATIONS USED:  MARCAINE     SPECIMEN:  No Specimen  DISPOSITION OF SPECIMEN:  N/A  COUNTS:  YES  TOURNIQUET:   Total Tourniquet Time Documented: Upper Arm (Left) - 16 minutes Total: Upper Arm (Left) - 16 minutes   DICTATION: .Other Dictation: Dictation Number [id number]  PLAN OF CARE: Discharge to home after PACU  PATIENT DISPOSITION:  PACU - hemodynamically stable.

## 2017-09-12 NOTE — Discharge Instructions (Addendum)

## 2017-09-13 ENCOUNTER — Encounter: Payer: Self-pay | Admitting: Family Medicine

## 2017-09-13 NOTE — Op Note (Signed)
NAME:  Olivia Reyes, Olivia Reyes                     ACCOUNT NO.:  MEDICAL RECORD NO.:  [id number]  LOCATION:                                 FACILITY:  PHYSICIAN:  Leanora Cover, MD        DATE OF BIRTH:  [birth year]  DATE OF PROCEDURE:  09/12/2017 DATE OF DISCHARGE:                              OPERATIVE REPORT   POSTOPERATIVE DIAGNOSIS:  Left palm laceration with possible artery and nerve laceration.  POSTOPERATIVE DIAGNOSIS:  Left palm laceration.  SURGEON:  Leanora Cover, MD  ASSISTANT:  Daryll Brod, M.D.  ANESTHESIA:  General.  IV FLUIDS:  Per anesthesia flow sheet.  ESTIMATED BLOOD LOSS:  Minimal.  COMPLICATIONS:  None.  SPECIMENS:  None.  TOURNIQUET TIME:  16 minutes.  DISPOSITION:  Stable to PACU.  INDICATIONS:  Ms. Olivia Reyes is a 33 year old right-hand dominant female who states she sustained a laceration to her left palm while trying to take an avocado pit out.  She was seen in the emergency department where the wound was cleaned and sutured.  She followed up in the office.  She had numbness in the index and long finger at the web space.  She wished to proceed with operative exploration with repair as necessary.  Risks, benefits and alternatives of the surgery were discussed including the risk of blood loss; infection; damage to nerves, vessels, tendons, ligaments, bone; failure of surgery; need for additional surgery; complications with wound healing; continued pain; continued numbness. She voiced understanding of these risks and elected to proceed.  OPERATIVE COURSE:  After being identified preoperatively by myself, the patient and I agreed upon the procedure and site of procedure.  Surgical site was marked.  Risks, benefits and alternatives of the surgery were reviewed and she wished to proceed.  Surgical consent had been signed. She was given IV Ancef as preoperative antibiotic prophylaxis.  She was transferred to the operating room and placed on the operating room  table in supine position with left upper extremity on armboard.  General anesthesia was induced by anesthesiologist.  Left upper extremity was prepped and draped in normal sterile orthopedic fashion.  A surgical pause was performed between the surgeons, Anesthesia and operating room staff; and all were in agreement as to the patient, procedure and site of procedure.  Tourniquet at the proximal aspect of the extremity was inflated to 250 mmHg after exsanguination of the limb with an Esmarch bandage.  The wound was extended both proximally and distally.  These sutures had been removed.  The subcutaneous tissues were entered by spreading technique.  The common digital nerve to the index and long finger web space was identified and traced distally.  There was no laceration.  The common digital artery to the index and long finger web space was identified and again traced distally.  There was no laceration to the artery.  The flexor tendons to the index and long finger were outside the zone of injury.  The wound was copiously irrigated with sterile saline.  It was then closed with 4-0 nylon in a horizontal mattress fashion.  It was injected with 0.25% plain Marcaine to aid in postoperative analgesia.  It was then dressed with sterile Xeroform, 4x4s and wrapped with Kerlix and an Ace bandage.  Tourniquet was deflated at 16 minutes.  Fingertips were pink with brisk capillary refill after deflation of the tourniquet.  The operative drapes were broken down.  The patient was awoken from anesthesia safely.  She was transferred back to the stretcher and taken to PACU in stable condition. We will see her back in the office in 1 week for postoperative followup. We will give her Norco 5/325 one to two p.o. q.6 hours p.r.n. pain, dispensed #20.     Leanora Cover, MD     KK/MEDQ  D:  09/12/2017  T:  09/13/2017  Job:  [id number]

## 2017-09-16 ENCOUNTER — Encounter (HOSPITAL_BASED_OUTPATIENT_CLINIC_OR_DEPARTMENT_OTHER): Payer: Self-pay | Admitting: Orthopedic Surgery

## 2017-09-22 NOTE — Anesthesia Postprocedure Evaluation (Signed)
Anesthesia Post Note  Patient: Olivia Reyes  Procedure(s) Performed: LEFT HAND EXPLORATION (Left Hand)     Patient location during evaluation: PACU Anesthesia Type: General Level of consciousness: awake and alert Pain management: pain level controlled Vital Signs Assessment: post-procedure vital signs reviewed and stable Respiratory status: spontaneous breathing, nonlabored ventilation, respiratory function stable and patient connected to nasal cannula oxygen Cardiovascular status: blood pressure returned to baseline and stable Postop Assessment: no apparent nausea or vomiting Anesthetic complications: no    Last Vitals:  Vitals:   09/12/17 1715 09/12/17 1745  BP: 113/73 121/63  Pulse: 69 (!) 56  Resp: 12 16  Temp:  36.7 C  SpO2: 98% 100%    Last Pain:  Vitals:   09/12/17 1745  PainSc: 4    Pain Goal: Patients Stated Pain Goal: 1 (09/12/17 1235)               Barnet Glasgow

## 2017-09-22 NOTE — Anesthesia Postprocedure Evaluation (Signed)
Anesthesia Post Note  Patient: Olivia Reyes  Procedure(s) Performed: LEFT HAND EXPLORATION (Left Hand)     Anesthesia Type: General    Last Vitals:  Vitals:   09/12/17 1715 09/12/17 1745  BP: 113/73 121/63  Pulse: 69 (!) 56  Resp: 12 16  Temp:  36.7 C  SpO2: 98% 100%    Last Pain:  Vitals:   09/12/17 1745  PainSc: 4    Pain Goal: Patients Stated Pain Goal: 1 (09/12/17 1235)               Barnet Glasgow

## 2017-09-30 ENCOUNTER — Encounter: Payer: Self-pay | Admitting: Infectious Diseases

## 2017-09-30 DIAGNOSIS — B028 Zoster with other complications: Secondary | ICD-10-CM

## 2017-10-01 NOTE — Telephone Encounter (Signed)
The order is in the computer.  thanks

## 2017-10-24 DIAGNOSIS — Z3042 Encounter for surveillance of injectable contraceptive: Secondary | ICD-10-CM | POA: Diagnosis not present

## 2017-11-05 ENCOUNTER — Ambulatory Visit: Payer: BLUE CROSS/BLUE SHIELD | Admitting: Family Medicine

## 2017-11-05 ENCOUNTER — Encounter: Payer: Self-pay | Admitting: Family Medicine

## 2017-11-05 VITALS — BP 110/75 | HR 57 | Temp 98.6°F | Resp 16 | Ht 71.0 in | Wt 231.4 lb

## 2017-11-05 DIAGNOSIS — J358 Other chronic diseases of tonsils and adenoids: Secondary | ICD-10-CM | POA: Diagnosis not present

## 2017-11-05 DIAGNOSIS — R0683 Snoring: Secondary | ICD-10-CM | POA: Insufficient documentation

## 2017-11-05 NOTE — Assessment & Plan Note (Signed)
Refer to neuro for sleep study Tonsils may be the cause --- consider ENT referral  Pt understands and is agreement with plan

## 2017-11-05 NOTE — Assessment & Plan Note (Signed)
Gargle with mouthwash/ saline water Probiotic Can refer to ent if pt would like Pt states gargling is hard for pt ---  She gags

## 2017-11-05 NOTE — Patient Instructions (Signed)
Try to gargle with mouthwash Take a probiotic We can refer you to ENT if you feel they get worse

## 2017-11-05 NOTE — Progress Notes (Signed)
Patient ID: Olivia Reyes, female   DOB: [birth year], 32 y.o.   MRN: [id number]    Subjective:  I acted as a Education administrator for Dr. Carollee Herter.  Guerry Bruin, [place]   Patient ID: Olivia Reyes, female    DOB: December 13, 1985, 32 y.o.   MRN: [id number]  Chief Complaint  Patient presents with  . whit spots on tonsils    HPI  Patient is in today for white spots on throat.  She feels like food sometimes gets stuck further down.  She was supposed to have a sleep study but because her deductible was so high she could not.  She has since had hand surgery so now is able to be evaluated.  She snores quite a bit , gets strep 2-3 x a year.  Her sister had her tonsils out due to having strep several times.  Pt does not have a sore throat, no fever, no congestion.    Patient Care Team: Copland, Gay Filler, MD as PCP - General (Family Medicine)   Past Medical History:  Diagnosis Date  . Anxiety   . Chicken pox   . Strep throat     Past Surgical History:  Procedure Laterality Date  . CHOLECYSTECTOMY  2013  . WISDOM TOOTH EXTRACTION    . WOUND EXPLORATION Left 09/12/2017   Procedure: LEFT HAND EXPLORATION;  Surgeon: Leanora Cover, MD;  Location: [place];  Service: Orthopedics;  Laterality: Left;    Family History  Problem Relation Age of Onset  . Hypertension Mother   . Arthritis Maternal Grandmother   . Hyperlipidemia Maternal Grandmother   . Hypertension Maternal Grandmother   . Throat cancer Maternal Grandfather   . Stroke Maternal Aunt   . Healthy Sister        x1  . Healthy Daughter        x1    Social History   Socioeconomic History  . Marital status: Single    Spouse name: Not on file  . Number of children: Not on file  . Years of education: Not on file  . Highest education level: Not on file  Occupational History  . Not on file  Social Needs  . Financial resource strain: Not on file  . Food insecurity:    Worry: Not on file    Inability: Not on file  . Transportation  needs:    Medical: Not on file    Non-medical: Not on file  Tobacco Use  . Smoking status: Former Smoker    Last attempt to quit: 07/27/2012    Years since quitting: 5.2  . Smokeless tobacco: Never Used  Substance and Sexual Activity  . Alcohol use: Yes    Alcohol/week: 0.6 oz    Types: 1 Standard drinks or equivalent per week  . Drug use: No  . Sexual activity: Yes    Birth control/protection: None    Comment: men  Lifestyle  . Physical activity:    Days per week: Not on file    Minutes per session: Not on file  . Stress: Not on file  Relationships  . Social connections:    Talks on phone: Not on file    Gets together: Not on file    Attends religious service: Not on file    Active member of club or organization: Not on file    Attends meetings of clubs or organizations: Not on file    Relationship status: Not on file  . Intimate partner violence:  Fear of current or ex partner: Not on file    Emotionally abused: Not on file    Physically abused: Not on file    Forced sexual activity: Not on file  Other Topics Concern  . Not on file  Social History Narrative  . Not on file    Outpatient Medications Prior to Visit  Medication Sig Dispense Refill  . ALPRAZolam (XANAX) 0.5 MG tablet TAKE 1 TABLET BY MOUTH TWICE A DAY AS NEEDED FOR ANXIETY 30 tablet 1  . FLUoxetine (PROZAC) 40 MG capsule Take 1 capsule (40 mg total) by mouth daily. 90 capsule 3  . medroxyPROGESTERone (DEPO-PROVERA) 150 MG/ML injection Inject 150 mg into the muscle every 3 (three) months.    . valACYclovir (VALTREX) 1000 MG tablet Take 1 tablet (1,000 mg total) by mouth daily. (Patient taking differently: Take 1,000 mg by mouth daily as needed. ) 10 tablet 1  . HYDROcodone-acetaminophen (NORCO) 5-325 MG tablet 1-2 tabs po q6 hours prn pain 20 tablet 0  . HYDROcodone-acetaminophen (NORCO/VICODIN) 5-325 MG tablet Take 1 tablet by mouth every 6 (six) hours as needed. 8 tablet 0   No facility-administered  medications prior to visit.     No Known Allergies  Review of Systems  Constitutional: Negative for fever and malaise/fatigue.  HENT: Negative for congestion and sore throat.   Eyes: Negative for blurred vision.  Respiratory: Negative for cough and shortness of breath.   Cardiovascular: Negative for chest pain, palpitations and leg swelling.  Gastrointestinal: Negative for vomiting.  Musculoskeletal: Negative for back pain.  Skin: Negative for rash.  Neurological: Negative for loss of consciousness and headaches.       Objective:    Physical Exam  Constitutional: She is oriented to person, place, and time. She appears well-developed and well-nourished.  HENT:  Head: Normocephalic and atraumatic.  Mouth/Throat: Oropharynx is clear and moist and mucous membranes are normal.    Eyes: Conjunctivae and EOM are normal.  Neck: Normal range of motion. Neck supple. No JVD present. Carotid bruit is not present. No thyromegaly present.  Cardiovascular: Normal rate, regular rhythm and normal heart sounds.  No murmur heard. Pulmonary/Chest: Effort normal and breath sounds normal. No respiratory distress. She has no wheezes. She has no rales. She exhibits no tenderness.  Musculoskeletal: She exhibits no edema.  Neurological: She is alert and oriented to person, place, and time.  Psychiatric: She has a normal mood and affect.  Nursing note and vitals reviewed.   BP 110/75 (BP Location: Left Arm, Cuff Size: Large)   Pulse (!) 57   Temp 98.6 F (37 C) (Oral)   Resp 16   Ht 5\' 11"  (1.803 m)   Wt 231 lb 6.4 oz (105 kg)   SpO2 100%   BMI 32.27 kg/m  Wt Readings from Last 3 Encounters:  11/05/17 231 lb 6.4 oz (105 kg)  09/12/17 225 lb (102.1 kg)  08/16/17 227 lb 3.2 oz (103.1 kg)   BP Readings from Last 3 Encounters:  11/05/17 110/75  09/12/17 121/63  09/03/17 125/71     Immunization History  Administered Date(s) Administered  . Influenza,inj,Quad PF,6+ Mos 02/13/2016  .  Tdap 09/03/2017    Health Maintenance  Topic Date Due  . INFLUENZA VACCINE  12/26/2017  . PAP SMEAR  07/27/2018  . TETANUS/TDAP  09/04/2027  . HIV Screening  Completed    Lab Results  Component Value Date   WBC 6.9 11/08/2016   HGB 14.2 11/08/2016   HCT 42.2 11/08/2016  PLT 234.0 11/08/2016   GLUCOSE 101 (H) 11/08/2016   CHOL 154 11/08/2016   TRIG 98.0 11/08/2016   HDL 37.80 (L) 11/08/2016   LDLCALC 97 11/08/2016   ALT 14 11/08/2016   AST 16 11/08/2016   NA 141 11/08/2016   K 4.5 11/08/2016   CL 108 11/08/2016   CREATININE 0.87 11/08/2016   BUN 9 11/08/2016   CO2 23 11/08/2016   TSH 2.23 11/08/2016   HGBA1C 5.4 11/08/2016    Lab Results  Component Value Date   TSH 2.23 11/08/2016   Lab Results  Component Value Date   WBC 6.9 11/08/2016   HGB 14.2 11/08/2016   HCT 42.2 11/08/2016   MCV 90.8 11/08/2016   PLT 234.0 11/08/2016   Lab Results  Component Value Date   NA 141 11/08/2016   K 4.5 11/08/2016   CO2 23 11/08/2016   GLUCOSE 101 (H) 11/08/2016   BUN 9 11/08/2016   CREATININE 0.87 11/08/2016   BILITOT 0.7 11/08/2016   ALKPHOS 75 11/08/2016   AST 16 11/08/2016   ALT 14 11/08/2016   PROT 7.2 11/08/2016   ALBUMIN 4.6 11/08/2016   CALCIUM 10.0 11/08/2016   GFR 81.29 11/08/2016   Lab Results  Component Value Date   CHOL 154 11/08/2016   Lab Results  Component Value Date   HDL 37.80 (L) 11/08/2016   Lab Results  Component Value Date   LDLCALC 97 11/08/2016   Lab Results  Component Value Date   TRIG 98.0 11/08/2016   Lab Results  Component Value Date   CHOLHDL 4 11/08/2016   Lab Results  Component Value Date   HGBA1C 5.4 11/08/2016         Assessment & Plan:   Problem List Items Addressed This Visit      Unprioritized   Snoring    Refer to neuro for sleep study Tonsils may be the cause --- consider ENT referral  Pt understands and is agreement with plan      Relevant Orders   Ambulatory referral to Neurology   Tonsil  stone - Primary    Gargle with mouthwash/ saline water Probiotic Can refer to ent if pt would like Pt states gargling is hard for pt ---  She gags          I have discontinued Elfriede N. Bostic's HYDROcodone-acetaminophen and HYDROcodone-acetaminophen. I am also having her maintain her medroxyPROGESTERone, ALPRAZolam, FLUoxetine, and valACYclovir.  No orders of the defined types were placed in this encounter.   CMA served as Education administrator during this visit. History, Physical and Plan performed by medical provider. Documentation and orders reviewed and attested to.  Ann Held, DO

## 2017-11-27 ENCOUNTER — Telehealth: Payer: Self-pay

## 2017-11-27 NOTE — Telephone Encounter (Signed)
We have attempted to call the patient three times to schedule sleep study.  Patient has been unavailable at the phone numbers we have on file and has not returned our calls.  At this point we will send a letter asking patient to please contact the sleep lab to schedule their sleep study.  If patient calls back we will schedule them for their sleep study.  

## 2017-12-16 ENCOUNTER — Encounter: Payer: Self-pay | Admitting: Infectious Diseases

## 2017-12-16 ENCOUNTER — Ambulatory Visit: Payer: BLUE CROSS/BLUE SHIELD | Admitting: *Deleted

## 2017-12-16 ENCOUNTER — Other Ambulatory Visit: Payer: Self-pay | Admitting: Infectious Diseases

## 2017-12-16 ENCOUNTER — Ambulatory Visit (INDEPENDENT_AMBULATORY_CARE_PROVIDER_SITE_OTHER): Payer: BLUE CROSS/BLUE SHIELD | Admitting: Infectious Diseases

## 2017-12-16 ENCOUNTER — Telehealth: Payer: Self-pay

## 2017-12-16 ENCOUNTER — Other Ambulatory Visit (HOSPITAL_COMMUNITY)
Admission: RE | Admit: 2017-12-16 | Discharge: 2017-12-16 | Disposition: A | Discharge status: Home / Self Care | Payer: BLUE CROSS/BLUE SHIELD | Source: Other Acute Inpatient Hospital | Attending: Family Medicine | Admitting: Family Medicine

## 2017-12-16 DIAGNOSIS — B028 Zoster with other complications: Secondary | ICD-10-CM

## 2017-12-16 DIAGNOSIS — B029 Zoster without complications: Secondary | ICD-10-CM | POA: Insufficient documentation

## 2017-12-16 MED ORDER — VALACYCLOVIR HCL 1 G PO TABS: 1000.0000 mg | ORAL_TABLET | Freq: Two times a day (BID) | ORAL | 1 refills | Status: AC

## 2017-12-16 NOTE — Progress Notes (Signed)
Pt came in as urgent visit for vesicles.  She started taking valtrex on sat after feeling pain in the area on Friday pm.   There were no vitals filed for this visit.   Media Information         Document Information   Photos    12/16/2017 12:25  Attached To:  [place]   Campbell Riches, MD  [facility]      Lesions are rubbed, popped with swab. Sent for viral cx, VZV pcr.  Will also send routine, afb, fungal cx.   I have asked her to see her dermatologist in next 48h as well.  Will call her with results.

## 2017-12-16 NOTE — Telephone Encounter (Signed)
Wound culture ordered by Dr. Johnnye Sima today during [place]. Marita [place], lab tech, already touched base with [facility] re: not getting lab culture performed at lab on site if pt. Was not seen by [facility] provider.

## 2017-12-16 NOTE — Telephone Encounter (Signed)
Lab technician from [facility], Luna Fuse, phoned author to clarify why pt. Was sent to their lab for a shingles viral culture, asking "do you not have the supplies?". Chief Strategy Officer was unsure, so message routed to [place], lab technicians at site, to call Luna Fuse back to investigate at [phone]

## 2017-12-16 NOTE — Progress Notes (Signed)
Pt to see Dr Johnnye Sima. Landis Gandy, RN

## 2017-12-16 NOTE — Telephone Encounter (Signed)
I was told the patient was sent to [place] from Infectious Disease. We can not do labs on patients from anywhere unless its a [place] office. I explained this to the person that inquired. We also only collect blood and urine, a culture would have to go on a doctor's or nurse appt to be collected. The patient told the person Infection Disease said they could come here but we are not a Draw station we only function as a doctor's office inpatient lab(only drawing blood as out patients for [place]).

## 2017-12-18 LAB — VARICELLA-ZOSTER BY PCR: VZV DNA, QL PCR: NOT DETECTED

## 2017-12-20 LAB — HERPES CULTURE, RAPID
MICRO NUMBER: 90875351
SPECIMEN QUALITY:: ADEQUATE

## 2017-12-25 ENCOUNTER — Other Ambulatory Visit: Payer: Self-pay | Admitting: Infectious Diseases

## 2017-12-25 NOTE — Progress Notes (Deleted)
[place] at [facility] [address] [place], [place] [address] [phone]  Date:  12/26/2017   Name:  Olivia Reyes   DOB:  Nov 26, 1985   MRN:  [id number]  PCP:  Darreld Mclean, MD    Chief Complaint: No chief complaint on file.   History of Present Illness:  Olivia Reyes is a 32 y.o. very pleasant female patient who presents with the following:  About a year ago she was doing ok with her depression and anxiety-  We changed her medication from lexapro to prozac at that visit and is very pleased with her results She still feels tired, but her depression is getting much better. "I have noticed a tremendous difference."   Over the last couple of months there has been some stress -a friend passed away in a motorcycle accident.  However she feels that she is handling this well given the circumstances She is down just a few lbs, BP is fine (not on BP meds)  She is taking 40 mg of prozac at this time  She is using her xanax 2-3x a week when needed for a panic attack I gave her #30 2 months ago She is sleeping well.   She notes that she often fatigued, but no one has ever mentioned that she stops breathing at night.  She has not though about OSA but would be willing to consider a sleep study There is a family history of thyroid disorder and she would like to check this today   09/12/2017  1  09/12/2017  Hydrocodone-Acetamin 5-325 Mg  20.00 2 Ke Kuz  [id number]  [place] ([id number])  1/1 50.00 MME Comm Ins  [place]  09/03/2017  1  09/03/2017  Hydrocodone-Acetamin 5-325 Mg  8.00 2 Da Darl Householder  [id number]  Med ([id number])  1/1 20.00 MME Comm Ins  [place]  03/28/2017  1  11/08/2016  Alprazolam 0.5 Mg Tablet  30.00 15 Je Cop  [id number]  [place] ([id number])  2/1 2.00 LME Comm Ins  [place]  11/08/2016  1  11/08/2016  Alprazolam 0.5 Mg Tablet  30.00 15 Je Cop  [id number]  [place] ([id number])  1/1 2.00 LME Comm Ins  [place]  09/12/2016  1  09/12/2016  Alprazolam 0.5 Mg Tablet  30.00 15 Je Cop  [id number]   [place] ([id number])  1/1       Patient Active Problem List   Diagnosis Date Noted  . Tonsil stone 11/05/2017  . Snoring 11/05/2017  . Rash and nonspecific skin eruption 11/15/2015  . Visit for preventive health examination 11/11/2015  . Obesity 11/11/2015  . Shingles rash 05/12/2014  . Anxiety 07/27/2013  . Abnormal Pap smear of cervix 07/27/2013    Past Medical History:  Diagnosis Date  . Anxiety   . Chicken pox   . Strep throat     Past Surgical History:  Procedure Laterality Date  . CHOLECYSTECTOMY  2013  . WISDOM TOOTH EXTRACTION    . WOUND EXPLORATION Left 09/12/2017   Procedure: LEFT HAND EXPLORATION;  Surgeon: Leanora Cover, MD;  Location: [place];  Service: Orthopedics;  Laterality: Left;    Social History   Tobacco Use  . Smoking status: Former Smoker    Last attempt to quit: 07/27/2012    Years since quitting: 5.4  . Smokeless tobacco: Never Used  Substance Use Topics  . Alcohol use: Yes    Alcohol/week: 0.6 oz    Types: 1  Standard drinks or equivalent per week  . Drug use: No    Family History  Problem Relation Age of Onset  . Hypertension Mother   . Arthritis Maternal Grandmother   . Hyperlipidemia Maternal Grandmother   . Hypertension Maternal Grandmother   . Throat cancer Maternal Grandfather   . Stroke Maternal Aunt   . Healthy Sister        x1  . Healthy Daughter        x1    No Known Allergies  Medication list has been reviewed and updated.  Current Outpatient Medications on File Prior to Visit  Medication Sig Dispense Refill  . ALPRAZolam (XANAX) 0.5 MG tablet TAKE 1 TABLET BY MOUTH TWICE A DAY AS NEEDED FOR ANXIETY 30 tablet 1  . FLUoxetine (PROZAC) 40 MG capsule Take 1 capsule (40 mg total) by mouth daily. 90 capsule 3  . medroxyPROGESTERone (DEPO-PROVERA) 150 MG/ML injection Inject 150 mg into the muscle every 3 (three) months.     No current facility-administered medications on file prior to visit.     Review of  Systems:  As per HPI- otherwise negative.   Physical Examination: There were no vitals filed for this visit. There were no vitals filed for this visit. There is no height or weight on file to calculate BMI. Ideal Body Weight:    GEN: WDWN, NAD, Non-toxic, A & O x 3 HEENT: Atraumatic, Normocephalic. Neck supple. No masses, No LAD. Ears and Nose: No external deformity. CV: RRR, No M/G/R. No JVD. No thrill. No extra heart sounds. PULM: CTA B, no wheezes, crackles, rhonchi. No retractions. No resp. distress. No accessory muscle use. ABD: S, NT, ND, +BS. No rebound. No HSM. EXTR: No c/c/e NEURO Normal gait.  PSYCH: Normally interactive. Conversant. Not depressed or anxious appearing.  Calm demeanor.    Assessment and Plan: ***  Signed Lamar Blinks, MD

## 2017-12-25 NOTE — Progress Notes (Signed)
Called pt to let her know HSV results. Left VM, did not tell her dx.  Asked her to call back for results.

## 2017-12-26 ENCOUNTER — Encounter: Payer: BLUE CROSS/BLUE SHIELD | Admitting: Family Medicine

## 2018-01-07 NOTE — Progress Notes (Addendum)
[place] at [facility] [address], [place], [place] [address] [phone]  Date:  01/09/2018   Name:  MARCIE Reyes   DOB:  March 10, 1986   MRN:  [id number]  PCP:  Darreld Mclean, MD    Chief Complaint: Annual Exam (medication refills,dizzines, shingles outbreaks weight loss management options) and Knee Pain (right knee, trouble walking and bending down, swelling past 3 weeks, no known injury)   History of Present Illness:  Olivia Reyes is a 32 y.o. very pleasant female patient who presents with the following:  Here today for a mychart annual exam  Wt Readings from Last 3 Encounters:  01/09/18 232 lb (105.2 kg)  11/05/17 231 lb 6.4 oz (105 kg)  09/12/17 225 lb (102.1 kg)   She is on xanax, prozac 40- she feels like these meds are working well for her, needs refills today  I last saw her about a year ago She is being seen by ID for "shingles"-  She tends to get this on her left thigh  May occur 1-2x a year in the same spot, has occurred for 3-4 years.   However looking at recent culture it looks to me like she actually has herpes virus but she does not seem to be aware of this   While in [place] last month she noted onset of pain in her RIGHT knee.  They have been doing more walking than is normal for her Has been present for 3- 4 weeks now Not popping, but will be very painful when she kneels down on her knee  She has noted more frequent headaches  For the last 3 days she has noted continuous dizziness which she describes as a feeling of swaying to the right She is trying to drink plenty of water  She may have some mild headaches but not as severe as she had in the past  She is able to do all her normal activities  She may feel more dizzy when she lies down in bed Seems somewhat better today No numbness or weakness in her body No slurred speech  Pap: 3/17- her her GYN, Dr. Helane Rima- will see her next week    09/12/2017  1  09/12/2017  Hydrocodone-Acetamin 5-325 Mg  20.00 2 Ke Kuz  [id number]  [place] ([id number])  1/1 50.00 MME Comm Ins  [place]  09/03/2017  1  09/03/2017  Hydrocodone-Acetamin 5-325 Mg  8.00 2 Da Darl Householder  [id number]  Med ([id number])  1/1 20.00 MME Comm Ins  [place]  03/28/2017  1  11/08/2016  Alprazolam 0.5 Mg Tablet  30.00 15 Je Cop  [id number]  [place] ([id number])  2/1 2.00 LME Comm Ins  [place]  11/08/2016  1  11/08/2016  Alprazolam 0.5 Mg Tablet  30.00 15 Je Cop  [id number]  [place] ([id number])  1/1 2.00 LME Comm Ins    09/12/2016  1  09/12/2016  Alprazolam 0.5 Mg Tablet  30.00 15 Je Cop  [id number]  [place] ([id number])  1/1      No fever  No tinnitus   She is still on depo and is UTD on her shot   Patient Active Problem List   Diagnosis Date Noted  . Tonsil stone 11/05/2017  . Snoring 11/05/2017  . Rash and nonspecific skin eruption 11/15/2015  . Visit for preventive health examination 11/11/2015  . Obesity 11/11/2015  . Shingles rash 05/12/2014  . Anxiety 07/27/2013  . Abnormal Pap smear of  cervix 07/27/2013    Past Medical History:  Diagnosis Date  . Anxiety   . Chicken pox   . Strep throat     Past Surgical History:  Procedure Laterality Date  . CHOLECYSTECTOMY  2013  . WISDOM TOOTH EXTRACTION    . WOUND EXPLORATION Left 09/12/2017   Procedure: LEFT HAND EXPLORATION;  Surgeon: Leanora Cover, MD;  Location: [place];  Service: Orthopedics;  Laterality: Left;    Social History   Tobacco Use  . Smoking status: Former Smoker    Last attempt to quit: 07/27/2012    Years since quitting: 5.4  . Smokeless tobacco: Never Used  Substance Use Topics  . Alcohol use: Yes    Alcohol/week: 1.0 standard drinks    Types: 1 Standard drinks or equivalent per week  . Drug use: No    Family History  Problem Relation Age of Onset  . Hypertension Mother   . Arthritis Maternal Grandmother   . Hyperlipidemia Maternal Grandmother   . Hypertension Maternal Grandmother   . Throat cancer Maternal Grandfather   . Stroke  Maternal Aunt   . Healthy Sister        x1  . Healthy Daughter        x1    No Known Allergies  Medication list has been reviewed and updated.  Current Outpatient Medications on File Prior to Visit  Medication Sig Dispense Refill  . ALPRAZolam (XANAX) 0.5 MG tablet TAKE 1 TABLET BY MOUTH TWICE A DAY AS NEEDED FOR ANXIETY 30 tablet 1  . FLUoxetine (PROZAC) 40 MG capsule Take 1 capsule (40 mg total) by mouth daily. 90 capsule 3  . medroxyPROGESTERone (DEPO-PROVERA) 150 MG/ML injection Inject 150 mg into the muscle every 3 (three) months.    . valACYclovir (VALTREX) 1000 MG tablet      No current facility-administered medications on file prior to visit.     Review of Systems:  As per HPI- otherwise negative. No fever or chills No CP or SOB   Physical Examination: Vitals:   01/09/18 1649  BP: 108/72  Pulse: 74  Resp: 16  SpO2: 99%   Vitals:   01/09/18 1649  Weight: 232 lb (105.2 kg)  Height: 5\' 11"  (1.803 m)   Body mass index is 32.36 kg/m. Ideal Body Weight: Weight in (lb) to have BMI = 25: 178.9  GEN: WDWN, NAD, Non-toxic, A & O x 3, looks well, obese HEENT: Atraumatic, Normocephalic. Neck supple. No masses, No LAD.  Bilateral TM wnl, oropharynx normal.  PEERL,EOMI.   Ears and Nose: No external deformity. CV: RRR, No M/G/R. No JVD. No thrill. No extra heart sounds. PULM: CTA B, no wheezes, crackles, rhonchi. No retractions. No resp. distress. No accessory muscle use. ABD: S, NT, ND, +BS. No rebound. No HSM. EXTR: No c/c/e NEURO Normal gait.  Negative romberg,  Normal strength, sensation and DTR of all extremities. Normal facial movement and sensation, normal RAM. Somewhat positive dix halpike on right only PSYCH: Normally interactive. Conversant. Not depressed or anxious appearing.  Calm demeanor.  Right knee: mild lateral joint line pain.  No effusion. No heat or redness, normal rom no crepitus   Assessment and Plan: Physical exam  Vesicular skin lesions -  Plan: HSV(herpes simplex vrs) 1+2 ab-IgG  Screening for deficiency anemia - Plan: CBC  Acute pain of right knee  Screening for diabetes mellitus - Plan: Comprehensive metabolic panel, Hemoglobin A1c  Screening for hyperlipidemia - Plan: Lipid panel  Vertigo  Anxiety and depression - Plan: FLUoxetine (PROZAC) 40 MG capsule, ALPRAZolam (XANAX) 0.5 MG tablet, DISCONTINUED: ALPRAZolam (XANAX) 0.5 MG tablet  Here today for a CPE Labs pending as above Pap per her OBG tdap is UTD She has noted knee pain for a few weeks -likely a strained ligament which will likely resolve. At this time we will observe, but she will let me know if not resolved in a few weeks and in that case I will refer her to ortho She recently had a culture of the "shingels" on her left thigh which was positive for HSV Discussed with pt.  She was not aware Will do a blood titer to help determine if HSV 1 or 2 per her request Dizziness for 3 days.  Pt looks well and her neuro exam is wnl. Cause of her vertigo is uncertain but likely benign. Offered to pursue further imaging of her head or ER eval but she declines at this time. She will try OTC meclizine prn, cautioned regarding sedation.  She will alert me if not resolved, will seek immediate care if worse   Signed Lamar Blinks, MD  Received her labs 8/16, message to pt   Results for orders placed or performed in visit on 01/09/18  CBC  Result Value Ref Range   WBC 8.8 4.0 - 10.5 K/uL   RBC 4.26 3.87 - 5.11 Mil/uL   Platelets 260.0 150.0 - 400.0 K/uL   Hemoglobin 13.5 12.0 - 15.0 g/dL   HCT 39.3 36.0 - 46.0 %   MCV 92.2 78.0 - 100.0 fl   MCHC 34.3 30.0 - 36.0 g/dL   RDW 13.5 11.5 - 15.5 %  Comprehensive metabolic panel  Result Value Ref Range   Sodium 139 135 - 145 mEq/L   Potassium 4.0 3.5 - 5.1 mEq/L   Chloride 105 96 - 112 mEq/L   CO2 27 19 - 32 mEq/L   Glucose, Bld 81 70 - 99 mg/dL   BUN 10 6 - 23 mg/dL   Creatinine, Ser 0.91 0.40 - 1.20 mg/dL    Total Bilirubin 0.4 0.2 - 1.2 mg/dL   Alkaline Phosphatase 73 39 - 117 U/L   AST 17 0 - 37 U/L   ALT 17 0 - 35 U/L   Total Protein 6.9 6.0 - 8.3 g/dL   Albumin 4.6 3.5 - 5.2 g/dL   Calcium 9.8 8.4 - 10.5 mg/dL   GFR 76.58 >60.00 mL/min  Hemoglobin A1c  Result Value Ref Range   Hgb A1c MFr Bld 5.4 4.6 - 6.5 %  Lipid panel  Result Value Ref Range   Cholesterol 177 0 - 200 mg/dL   Triglycerides 185.0 (H) 0.0 - 149.0 mg/dL   HDL 42.20 >39.00 mg/dL   VLDL 37.0 0.0 - 40.0 mg/dL   LDL Cholesterol 98 0 - 99 mg/dL   Total CHOL/HDL Ratio 4    NonHDL 134.67

## 2018-01-08 ENCOUNTER — Telehealth: Payer: Self-pay

## 2018-01-08 NOTE — Telephone Encounter (Signed)
Patient called regarding results on lab culture that was done on 7/22. Will route message to Dr. Johnnye Sima that patient would like a call back regarding lab result. Can call patients work number between [phone]. [place]

## 2018-01-09 ENCOUNTER — Ambulatory Visit (INDEPENDENT_AMBULATORY_CARE_PROVIDER_SITE_OTHER): Payer: BLUE CROSS/BLUE SHIELD | Admitting: Family Medicine

## 2018-01-09 ENCOUNTER — Encounter: Payer: Self-pay | Admitting: Family Medicine

## 2018-01-09 VITALS — BP 108/72 | HR 74 | Resp 16 | Ht 71.0 in | Wt 232.0 lb

## 2018-01-09 DIAGNOSIS — Z130 Encounter for screening for diseases of the blood and blood-forming organs and certain disorders involving the immune mechanism: Secondary | ICD-10-CM

## 2018-01-09 DIAGNOSIS — R42 Dizziness and giddiness: Secondary | ICD-10-CM

## 2018-01-09 DIAGNOSIS — Z13220 Encounter for screening for lipoid disorders: Secondary | ICD-10-CM | POA: Diagnosis not present

## 2018-01-09 DIAGNOSIS — Z0000 Encounter for general adult medical examination without abnormal findings: Secondary | ICD-10-CM | POA: Diagnosis not present

## 2018-01-09 DIAGNOSIS — F32A Depression, unspecified: Secondary | ICD-10-CM

## 2018-01-09 DIAGNOSIS — F419 Anxiety disorder, unspecified: Secondary | ICD-10-CM

## 2018-01-09 DIAGNOSIS — R238 Other skin changes: Secondary | ICD-10-CM | POA: Diagnosis not present

## 2018-01-09 DIAGNOSIS — F329 Major depressive disorder, single episode, unspecified: Secondary | ICD-10-CM

## 2018-01-09 DIAGNOSIS — Z131 Encounter for screening for diabetes mellitus: Secondary | ICD-10-CM | POA: Diagnosis not present

## 2018-01-09 DIAGNOSIS — M25561 Pain in right knee: Secondary | ICD-10-CM | POA: Diagnosis not present

## 2018-01-09 MED ORDER — FLUOXETINE HCL 40 MG PO CAPS: 40.0000 mg | ORAL_CAPSULE | Freq: Every day | ORAL | 3 refills | Status: DC

## 2018-01-09 MED ORDER — ALPRAZOLAM 0.5 MG PO TABS: ORAL_TABLET | ORAL | 1 refills | Status: DC

## 2018-01-09 NOTE — Telephone Encounter (Signed)
Relayed message to patient she is unable to come into office for appointment due to work. Would like for Dr. Johnnye Sima to call her back. [place]

## 2018-01-09 NOTE — Patient Instructions (Signed)
Good to see you today!  I will be in touch with your labs I will message Dr Hatcher to clarify with you Let me know if your knee continues to hurt over the next few weeks and I will refer you to orthopedics Please pick up some otc meclizine for your dizziness- but let me know if this persists.  If any significant worsening or severe headache, etc go to the ER!   Health Maintenance, Female Adopting a healthy lifestyle and getting preventive care can go a long way to promote health and wellness. Talk with your health care provider about what schedule of regular examinations is right for you. This is a good chance for you to check in with your provider about disease prevention and staying healthy. In between checkups, there are plenty of things you can do on your own. Experts have done a lot of research about which lifestyle changes and preventive measures are most likely to keep you healthy. Ask your health care provider for more information. Weight and diet Eat a healthy diet  Be sure to include plenty of vegetables, fruits, low-fat dairy products, and lean protein.  Do not eat a lot of foods high in solid fats, added sugars, or salt.  Get regular exercise. This is one of the most important things you can do for your health. ? Most adults should exercise for at least 150 minutes each week. The exercise should increase your heart rate and make you sweat (moderate-intensity exercise). ? Most adults should also do strengthening exercises at least twice a week. This is in addition to the moderate-intensity exercise.  Maintain a healthy weight  Body mass index (BMI) is a measurement that can be used to identify possible weight problems. It estimates body fat based on height and weight. Your health care provider can help determine your BMI and help you achieve or maintain a healthy weight.  For females 20 years of age and older: ? A BMI below 18.5 is considered underweight. ? A BMI of 18.5 to 24.9  is normal. ? A BMI of 25 to 29.9 is considered overweight. ? A BMI of 30 and above is considered obese.  Watch levels of cholesterol and blood lipids  You should start having your blood tested for lipids and cholesterol at 31 years of age, then have this test every 5 years.  You may need to have your cholesterol levels checked more often if: ? Your lipid or cholesterol levels are high. ? You are older than 31 years of age. ? You are at high risk for heart disease.  Cancer screening Lung Cancer  Lung cancer screening is recommended for adults 55-80 years old who are at high risk for lung cancer because of a history of smoking.  A yearly low-dose CT scan of the lungs is recommended for people who: ? Currently smoke. ? Have quit within the past 15 years. ? Have at least a 30-pack-year history of smoking. A pack year is smoking an average of one pack of cigarettes a day for 1 year.  Yearly screening should continue until it has been 15 years since you quit.  Yearly screening should stop if you develop a health problem that would prevent you from having lung cancer treatment.  Breast Cancer  Practice breast self-awareness. This means understanding how your breasts normally appear and feel.  It also means doing regular breast self-exams. Let your health care provider know about any changes, no matter how small.  If you are   in your 20s or 30s, you should have a clinical breast exam (CBE) by a health care provider every 1-3 years as part of a regular health exam.  If you are 40 or older, have a CBE every year. Also consider having a breast X-ray (mammogram) every year.  If you have a family history of breast cancer, talk to your health care provider about genetic screening.  If you are at high risk for breast cancer, talk to your health care provider about having an MRI and a mammogram every year.  Breast cancer gene (BRCA) assessment is recommended for women who have family members  with BRCA-related cancers. BRCA-related cancers include: ? Breast. ? Ovarian. ? Tubal. ? Peritoneal cancers.  Results of the assessment will determine the need for genetic counseling and BRCA1 and BRCA2 testing.  Cervical Cancer Your health care provider may recommend that you be screened regularly for cancer of the pelvic organs (ovaries, uterus, and vagina). This screening involves a pelvic examination, including checking for microscopic changes to the surface of your cervix (Pap test). You may be encouraged to have this screening done every 3 years, beginning at age 21.  For women ages 30-65, health care providers may recommend pelvic exams and Pap testing every 3 years, or they may recommend the Pap and pelvic exam, combined with testing for human papilloma virus (HPV), every 5 years. Some types of HPV increase your risk of cervical cancer. Testing for HPV may also be done on women of any age with unclear Pap test results.  Other health care providers may not recommend any screening for nonpregnant women who are considered low risk for pelvic cancer and who do not have symptoms. Ask your health care provider if a screening pelvic exam is right for you.  If you have had past treatment for cervical cancer or a condition that could lead to cancer, you need Pap tests and screening for cancer for at least 20 years after your treatment. If Pap tests have been discontinued, your risk factors (such as having a new sexual partner) need to be reassessed to determine if screening should resume. Some women have medical problems that increase the chance of getting cervical cancer. In these cases, your health care provider may recommend more frequent screening and Pap tests.  Colorectal Cancer  This type of cancer can be detected and often prevented.  Routine colorectal cancer screening usually begins at 31 years of age and continues through 31 years of age.  Your health care provider may recommend  screening at an earlier age if you have risk factors for colon cancer.  Your health care provider may also recommend using home test kits to check for hidden blood in the stool.  A small camera at the end of a tube can be used to examine your colon directly (sigmoidoscopy or colonoscopy). This is done to check for the earliest forms of colorectal cancer.  Routine screening usually begins at age 50.  Direct examination of the colon should be repeated every 5-10 years through 31 years of age. However, you may need to be screened more often if early forms of precancerous polyps or small growths are found.  Skin Cancer  Check your skin from head to toe regularly.  Tell your health care provider about any new moles or changes in moles, especially if there is a change in a mole's shape or color.  Also tell your health care provider if you have a mole that is larger than the   size of a pencil eraser.  Always use sunscreen. Apply sunscreen liberally and repeatedly throughout the day.  Protect yourself by wearing long sleeves, pants, a wide-brimmed hat, and sunglasses whenever you are outside.  Heart disease, diabetes, and high blood pressure  High blood pressure causes heart disease and increases the risk of stroke. High blood pressure is more likely to develop in: ? People who have blood pressure in the high end of the normal range (130-139/85-89 mm Hg). ? People who are overweight or obese. ? People who are African American.  If you are 18-39 years of age, have your blood pressure checked every 3-5 years. If you are 40 years of age or older, have your blood pressure checked every year. You should have your blood pressure measured twice-once when you are at a hospital or clinic, and once when you are not at a hospital or clinic. Record the average of the two measurements. To check your blood pressure when you are not at a hospital or clinic, you can use: ? An automated blood pressure machine at  a pharmacy. ? A home blood pressure monitor.  If you are between 55 years and 79 years old, ask your health care provider if you should take aspirin to prevent strokes.  Have regular diabetes screenings. This involves taking a blood sample to check your fasting blood sugar level. ? If you are at a normal weight and have a low risk for diabetes, have this test once every three years after 31 years of age. ? If you are overweight and have a high risk for diabetes, consider being tested at a younger age or more often. Preventing infection Hepatitis B  If you have a higher risk for hepatitis B, you should be screened for this virus. You are considered at high risk for hepatitis B if: ? You were born in a country where hepatitis B is common. Ask your health care provider which countries are considered high risk. ? Your parents were born in a high-risk country, and you have not been immunized against hepatitis B (hepatitis B vaccine). ? You have HIV or AIDS. ? You use needles to inject street drugs. ? You live with someone who has hepatitis B. ? You have had sex with someone who has hepatitis B. ? You get hemodialysis treatment. ? You take certain medicines for conditions, including cancer, organ transplantation, and autoimmune conditions.  Hepatitis C  Blood testing is recommended for: ? Everyone born from 1945 through 1965. ? Anyone with known risk factors for hepatitis C.  Sexually transmitted infections (STIs)  You should be screened for sexually transmitted infections (STIs) including gonorrhea and chlamydia if: ? You are sexually active and are younger than 31 years of age. ? You are older than 31 years of age and your health care provider tells you that you are at risk for this type of infection. ? Your sexual activity has changed since you were last screened and you are at an increased risk for chlamydia or gonorrhea. Ask your health care provider if you are at risk.  If you do  not have HIV, but are at risk, it may be recommended that you take a prescription medicine daily to prevent HIV infection. This is called pre-exposure prophylaxis (PrEP). You are considered at risk if: ? You are sexually active and do not regularly use condoms or know the HIV status of your partner(s). ? You take drugs by injection. ? You are sexually active with a partner   who has HIV.  Talk with your health care provider about whether you are at high risk of being infected with HIV. If you choose to begin PrEP, you should first be tested for HIV. You should then be tested every 3 months for as long as you are taking PrEP. Pregnancy  If you are premenopausal and you may become pregnant, ask your health care provider about preconception counseling.  If you may become pregnant, take 400 to 800 micrograms (mcg) of folic acid every day.  If you want to prevent pregnancy, talk to your health care provider about birth control (contraception). Osteoporosis and menopause  Osteoporosis is a disease in which the bones lose minerals and strength with aging. This can result in serious bone fractures. Your risk for osteoporosis can be identified using a bone density scan.  If you are 65 years of age or older, or if you are at risk for osteoporosis and fractures, ask your health care provider if you should be screened.  Ask your health care provider whether you should take a calcium or vitamin D supplement to lower your risk for osteoporosis.  Menopause may have certain physical symptoms and risks.  Hormone replacement therapy may reduce some of these symptoms and risks. Talk to your health care provider about whether hormone replacement therapy is right for you. Follow these instructions at home:  Schedule regular health, dental, and eye exams.  Stay current with your immunizations.  Do not use any tobacco products including cigarettes, chewing tobacco, or electronic cigarettes.  If you are  pregnant, do not drink alcohol.  If you are breastfeeding, limit how much and how often you drink alcohol.  Limit alcohol intake to no more than 1 drink per day for nonpregnant women. One drink equals 12 ounces of beer, 5 ounces of wine, or 1 ounces of hard liquor.  Do not use street drugs.  Do not share needles.  Ask your health care provider for help if you need support or information about quitting drugs.  Tell your health care provider if you often feel depressed.  Tell your health care provider if you have ever been abused or do not feel safe at home. This information is not intended to replace advice given to you by your health care provider. Make sure you discuss any questions you have with your health care provider. Document Released: 11/27/2010 Document Revised: 10/20/2015 Document Reviewed: 02/15/2015 Elsevier Interactive Patient Education  2018 Elsevier Inc.    

## 2018-01-09 NOTE — Telephone Encounter (Signed)
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

## 2018-01-10 ENCOUNTER — Encounter: Payer: Self-pay | Admitting: Family Medicine

## 2018-01-10 LAB — COMPREHENSIVE METABOLIC PANEL
ALBUMIN: 4.6 g/dL (ref 3.5–5.2)
ALK PHOS: 73 U/L (ref 39–117)
ALT: 17 U/L (ref 0–35)
AST: 17 U/L (ref 0–37)
BILIRUBIN TOTAL: 0.4 mg/dL (ref 0.2–1.2)
BUN: 10 mg/dL (ref 6–23)
CALCIUM: 9.8 mg/dL (ref 8.4–10.5)
CHLORIDE: 105 meq/L (ref 96–112)
CO2: 27 mEq/L (ref 19–32)
CREATININE: 0.91 mg/dL (ref 0.40–1.20)
GFR: 76.58 mL/min (ref >60.00)
Glucose, Bld: 81 mg/dL (ref 70–99)
Potassium: 4 mEq/L (ref 3.5–5.1)
SODIUM: 139 meq/L (ref 135–145)
TOTAL PROTEIN: 6.9 g/dL (ref 6.0–8.3)

## 2018-01-10 LAB — LIPID PANEL
CHOLESTEROL: 177 mg/dL (ref 0–200)
HDL: 42.2 mg/dL (ref >39.00)
LDL Cholesterol: 98 mg/dL (ref 0–99)
NONHDL: 134.67
Total CHOL/HDL Ratio: 4
Triglycerides: 185 mg/dL — ABNORMAL HIGH (ref 0.0–149.0)
VLDL: 37 mg/dL (ref 0.0–40.0)

## 2018-01-10 LAB — CBC
HCT: 39.3 % (ref 36.0–46.0)
Hemoglobin: 13.5 g/dL (ref 12.0–15.0)
MCHC: 34.3 g/dL (ref 30.0–36.0)
MCV: 92.2 fl (ref 78.0–100.0)
PLATELETS: 260 10*3/uL (ref 150.0–400.0)
RBC: 4.26 Mil/uL (ref 3.87–5.11)
RDW: 13.5 % (ref 11.5–15.5)
WBC: 8.8 10*3/uL (ref 4.0–10.5)

## 2018-01-10 LAB — HEMOGLOBIN A1C: HEMOGLOBIN A1C: 5.4 % (ref 4.6–6.5)

## 2018-01-13 ENCOUNTER — Ambulatory Visit: Payer: BLUE CROSS/BLUE SHIELD | Admitting: Infectious Diseases

## 2018-01-13 ENCOUNTER — Encounter: Payer: Self-pay | Admitting: Family Medicine

## 2018-01-13 DIAGNOSIS — Z01419 Encounter for gynecological examination (general) (routine) without abnormal findings: Secondary | ICD-10-CM | POA: Diagnosis not present

## 2018-01-13 DIAGNOSIS — Z3042 Encounter for surveillance of injectable contraceptive: Secondary | ICD-10-CM | POA: Diagnosis not present

## 2018-01-13 DIAGNOSIS — Z6834 Body mass index (BMI) 34.0-34.9, adult: Secondary | ICD-10-CM | POA: Diagnosis not present

## 2018-01-13 LAB — HSV(HERPES SIMPLEX VRS) I + II AB-IGG: HSV 2 IGG,TYPE SPECIFIC AB: 8.75 index — ABNORMAL HIGH

## 2018-01-31 ENCOUNTER — Ambulatory Visit: Payer: BLUE CROSS/BLUE SHIELD | Admitting: Infectious Diseases

## 2018-02-04 ENCOUNTER — Ambulatory Visit: Payer: BLUE CROSS/BLUE SHIELD | Admitting: Infectious Diseases

## 2018-02-07 ENCOUNTER — Encounter: Payer: Self-pay | Admitting: Infectious Diseases

## 2018-02-07 ENCOUNTER — Ambulatory Visit (INDEPENDENT_AMBULATORY_CARE_PROVIDER_SITE_OTHER): Payer: BLUE CROSS/BLUE SHIELD | Admitting: Infectious Diseases

## 2018-02-07 VITALS — Temp 98.2°F | Ht 71.0 in | Wt 221.0 lb

## 2018-02-07 DIAGNOSIS — B009 Herpesviral infection, unspecified: Secondary | ICD-10-CM | POA: Diagnosis not present

## 2018-02-07 MED ORDER — VALACYCLOVIR HCL 1 G PO TABS: 500.0000 mg | ORAL_TABLET | Freq: Every day | ORAL | 3 refills | Status: DC

## 2018-02-07 NOTE — Assessment & Plan Note (Signed)
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  That this is not contagious, if she is on rx, keeping wound covered. It is not spread environmentally.  We spoke aobut episodic rx vs suppressive therapy.  She would like to try suppresive rx.  Will refill as needed.  Will see her back as needed.

## 2018-02-07 NOTE — Progress Notes (Signed)
   Subjective:    Patient ID: Olivia Bible, female    DOB: 1986/01/23, 32 y.o.   MRN: [id number]  HPI 32 yo F with recurrent epsiodes of shingles. First episode ~ 2015.  She was last seen in ID 10-2015 and was last seen in July when she presented with blisters on her leg. These were swabbed and HSV DNA was found.  She was given course of valtrex.   She still has pain in the area, as well as tingling when she gets goose bumps.   Review of Systems See HPI    Objective:   Physical Exam  Constitutional: She appears well-developed and well-nourished. She appears distressed.          Assessment & Plan:

## 2018-03-03 ENCOUNTER — Other Ambulatory Visit: Payer: Self-pay | Admitting: Family Medicine

## 2018-03-03 DIAGNOSIS — F329 Major depressive disorder, single episode, unspecified: Secondary | ICD-10-CM

## 2018-03-03 DIAGNOSIS — F419 Anxiety disorder, unspecified: Principal | ICD-10-CM

## 2018-03-03 DIAGNOSIS — F32A Depression, unspecified: Secondary | ICD-10-CM

## 2018-03-04 ENCOUNTER — Encounter: Payer: Self-pay | Admitting: Family Medicine

## 2018-03-04 NOTE — Telephone Encounter (Signed)
Reviewed [facility]- ok to refill.  However pt is using faster than is her pattern.  Message to pt to check on her

## 2018-03-04 NOTE — Telephone Encounter (Signed)
Requesting:Alprazolam Contract:05/15/16 needs updated csc WXI:PPND, needs uds Last Visit:01/09/18 Next Visit:none Last Refill:01/09/18 1 refill  Please Advise

## 2018-04-02 DIAGNOSIS — Z3042 Encounter for surveillance of injectable contraceptive: Secondary | ICD-10-CM | POA: Diagnosis not present

## 2018-04-03 DIAGNOSIS — F332 Major depressive disorder, recurrent severe without psychotic features: Secondary | ICD-10-CM | POA: Diagnosis not present

## 2018-04-03 DIAGNOSIS — F4310 Post-traumatic stress disorder, unspecified: Secondary | ICD-10-CM | POA: Diagnosis not present

## 2018-04-03 DIAGNOSIS — F411 Generalized anxiety disorder: Secondary | ICD-10-CM | POA: Diagnosis not present

## 2018-04-30 DIAGNOSIS — F332 Major depressive disorder, recurrent severe without psychotic features: Secondary | ICD-10-CM | POA: Diagnosis not present

## 2018-04-30 DIAGNOSIS — F4310 Post-traumatic stress disorder, unspecified: Secondary | ICD-10-CM | POA: Diagnosis not present

## 2018-04-30 DIAGNOSIS — F411 Generalized anxiety disorder: Secondary | ICD-10-CM | POA: Diagnosis not present

## 2018-06-18 DIAGNOSIS — Z3042 Encounter for surveillance of injectable contraceptive: Secondary | ICD-10-CM | POA: Diagnosis not present

## 2018-07-01 DIAGNOSIS — L219 Seborrheic dermatitis, unspecified: Secondary | ICD-10-CM | POA: Diagnosis not present

## 2018-07-01 DIAGNOSIS — L409 Psoriasis, unspecified: Secondary | ICD-10-CM | POA: Diagnosis not present

## 2018-07-01 DIAGNOSIS — D239 Other benign neoplasm of skin, unspecified: Secondary | ICD-10-CM

## 2018-07-01 DIAGNOSIS — D235 Other benign neoplasm of skin of trunk: Secondary | ICD-10-CM | POA: Diagnosis not present

## 2018-07-01 HISTORY — DX: Other benign neoplasm of skin, unspecified: D23.9

## 2018-07-07 DIAGNOSIS — F4310 Post-traumatic stress disorder, unspecified: Secondary | ICD-10-CM | POA: Diagnosis not present

## 2018-07-07 DIAGNOSIS — F332 Major depressive disorder, recurrent severe without psychotic features: Secondary | ICD-10-CM | POA: Diagnosis not present

## 2018-07-07 DIAGNOSIS — F411 Generalized anxiety disorder: Secondary | ICD-10-CM | POA: Diagnosis not present

## 2018-07-15 DIAGNOSIS — D235 Other benign neoplasm of skin of trunk: Secondary | ICD-10-CM | POA: Diagnosis not present

## 2018-07-22 DIAGNOSIS — Z302 Encounter for sterilization: Secondary | ICD-10-CM | POA: Diagnosis not present

## 2018-08-04 NOTE — H&P (Signed)
33 year old female presents for [facility] BTL, HTA. She desires permanent sterilization. Past Medical History:  Diagnosis Date  . Anxiety   . Chicken pox   . Strep throat    Past Surgical History:  Procedure Laterality Date  . CHOLECYSTECTOMY  2013  . WISDOM TOOTH EXTRACTION    . WOUND EXPLORATION Left 09/12/2017   Procedure: LEFT HAND EXPLORATION;  Surgeon: Leanora Cover, MD;  Location: [facility];  Service: Orthopedics;  Laterality: Left;   Patient has no known allergies.  Prior to Admission medications   Medication Sig Start Date End Date Taking? Authorizing Provider  ALPRAZolam (XANAX) 0.5 MG tablet TAKE 1 TABLET BY MOUTH TWICE A DAY AS NEEDED FOR ANXIETY 03/04/18   Copland, Gay Filler, MD  FLUoxetine (PROZAC) 40 MG capsule Take 1 capsule (40 mg total) by mouth daily. 01/09/18   Copland, Gay Filler, MD  medroxyPROGESTERone (DEPO-PROVERA) 150 MG/ML injection Inject 150 mg into the muscle every 3 (three) months.    [provider]  valACYclovir (VALTREX) 1000 MG tablet Take 0.5 tablets (500 mg total) by mouth daily. 02/07/18   Campbell Riches, MD   General alert and oriented Lung CTAB  Car RRR Abdomen soft and non tender Pelvic WNL  IMPRESSION: Desires Permanent sterilization Menorrhagia  PLAN: [facility] BTL Hysteroscopy D and C HTA Risks reviewed Consent signed

## 2018-08-13 ENCOUNTER — Encounter (HOSPITAL_BASED_OUTPATIENT_CLINIC_OR_DEPARTMENT_OTHER): Payer: Self-pay

## 2018-08-13 ENCOUNTER — Other Ambulatory Visit: Payer: Self-pay

## 2018-08-13 NOTE — Progress Notes (Signed)
Spoke with:  Kendelle NPO:  No food after midnight/Clear liquids until 4:30AM DOS, Drink pre surgery drink at 4:30AM Arrival time: 0530AM Labs:  UPT (CBC, T&S epic) AM medications:  Klonopin if needed Pre op orders:  Yes Ride home:  Jakia Kennebrew (boyfriend) [phone]

## 2018-08-15 ENCOUNTER — Encounter (HOSPITAL_COMMUNITY): Payer: Self-pay

## 2018-08-15 ENCOUNTER — Inpatient Hospital Stay (HOSPITAL_COMMUNITY): Admission: RE | Admit: 2018-08-15 | Payer: BLUE CROSS/BLUE SHIELD | Source: Ambulatory Visit

## 2018-08-21 ENCOUNTER — Encounter: Payer: Self-pay | Admitting: *Deleted

## 2018-09-24 DIAGNOSIS — M9904 Segmental and somatic dysfunction of sacral region: Secondary | ICD-10-CM | POA: Diagnosis not present

## 2018-09-24 DIAGNOSIS — M9902 Segmental and somatic dysfunction of thoracic region: Secondary | ICD-10-CM | POA: Diagnosis not present

## 2018-09-24 DIAGNOSIS — M9901 Segmental and somatic dysfunction of cervical region: Secondary | ICD-10-CM | POA: Diagnosis not present

## 2018-10-08 NOTE — H&P (Signed)
  33 year old female presents for [facility] BTL, HTA History of Menorrhagia currently on Depo Provera Past Medical History:  Diagnosis Date  . Anxiety   . Chicken pox   . Complication of anesthesia    Prolonged sedation from anesthesia  . HSV infection   . Left ovarian cyst 03/2013   3.8 cm minimally complex left ovarian cyst  . moderate 07/01/2018   right low paraspinal, right upper abdomen  . Obesity   . Shingles 2019  . Strep throat   . Tonsil stone 2019   Past Surgical History:  Procedure Laterality Date  . CHOLECYSTECTOMY  2013  . WISDOM TOOTH EXTRACTION    . WOUND EXPLORATION Left 09/12/2017   Procedure: LEFT HAND EXPLORATION;  Surgeon: Leanora Cover, MD;  Location: [facility];  Service: Orthopedics;  Laterality: Left;   Patient has no known allergies.  Prior to Admission medications   Medication Sig Start Date End Date Taking? Authorizing Provider  citalopram (CELEXA) 20 MG tablet Take 20 mg by mouth every evening.    [provider]  clonazePAM (KLONOPIN) 0.5 MG tablet Take 0.5 mg by mouth 2 (two) times daily as needed for anxiety.    [provider]  medroxyPROGESTERone (DEPO-PROVERA) 150 MG/ML injection Inject 150 mg into the muscle every 3 (three) months.    [provider]  valACYclovir (VALTREX) 1000 MG tablet Take 0.5 tablets (500 mg total) by mouth daily. Patient taking differently: Take 250 mg by mouth every evening.  02/07/18   Campbell Riches, MD   General alert and oriented Lung CTAB Car RRR  IMPRESSION: Menorrhagia Desires Permanent sterilization  PLAN: [facility] BTL Hysteroscopy, D and C, HTA

## 2018-10-13 ENCOUNTER — Ambulatory Visit (HOSPITAL_BASED_OUTPATIENT_CLINIC_OR_DEPARTMENT_OTHER)
Admission: RE | Admit: 2018-10-13 | Payer: BLUE CROSS/BLUE SHIELD | Source: Home / Self Care | Admitting: Obstetrics and Gynecology

## 2018-10-13 HISTORY — DX: Unspecified ovarian cyst, left side: N83.202

## 2018-10-13 HISTORY — DX: Other complications of anesthesia, initial encounter: T88.59XA

## 2018-10-13 HISTORY — DX: Adverse effect of unspecified anesthetic, initial encounter: T41.45XA

## 2018-10-13 HISTORY — DX: Herpesviral infection, unspecified: B00.9

## 2018-10-13 HISTORY — DX: Obesity, unspecified: E66.9

## 2018-10-13 HISTORY — DX: Other chronic diseases of tonsils and adenoids: J35.8

## 2018-10-13 HISTORY — DX: Zoster without complications: B02.9

## 2018-10-13 SURGERY — LIGATION, FALLOPIAN TUBE, LAPAROSCOPIC
Anesthesia: General

## 2018-10-15 DIAGNOSIS — M9902 Segmental and somatic dysfunction of thoracic region: Secondary | ICD-10-CM | POA: Diagnosis not present

## 2018-10-15 DIAGNOSIS — M9901 Segmental and somatic dysfunction of cervical region: Secondary | ICD-10-CM | POA: Diagnosis not present

## 2018-10-15 DIAGNOSIS — M9904 Segmental and somatic dysfunction of sacral region: Secondary | ICD-10-CM | POA: Diagnosis not present

## 2018-10-17 ENCOUNTER — Ambulatory Visit (INDEPENDENT_AMBULATORY_CARE_PROVIDER_SITE_OTHER): Payer: BLUE CROSS/BLUE SHIELD | Admitting: Medical

## 2018-10-17 ENCOUNTER — Other Ambulatory Visit: Payer: Self-pay

## 2018-10-17 ENCOUNTER — Encounter: Payer: Self-pay | Admitting: Medical

## 2018-10-17 VITALS — Temp 98.4°F | Ht 71.0 in | Wt 223.0 lb

## 2018-10-17 DIAGNOSIS — H9201 Otalgia, right ear: Secondary | ICD-10-CM

## 2018-10-17 MED ORDER — AMOXICILLIN-POT CLAVULANATE 875-125 MG PO TABS: 1.0000 | ORAL_TABLET | Freq: Two times a day (BID) | ORAL | 0 refills | Status: DC

## 2018-10-17 MED ORDER — NEOMYCIN-POLYMYXIN-HC 3.5-10000-1 OT SOLN: 3.0000 [drp] | Freq: Four times a day (QID) | OTIC | 0 refills | Status: DC

## 2018-10-17 NOTE — Patient Instructions (Signed)
Probable otitis externa with possible eustachian tube pressure. I rx'd cortisporin otic drops and pt advised to use her flonase nasal spray. Explained making augmentin oral antibiotic available over the weekend /only to start if symptoms become severe over weekend as don't want her to have to be seen in UC or ED unnecessarily during pandemic.  Follow up 7 days or as needed Explained if symptoms don't resolve then office visit would be necessary.

## 2018-10-17 NOTE — Progress Notes (Signed)
   Subjective:    Patient ID: Olivia Reyes, female    DOB: [birth year], 33 y.o.   MRN: [id number]  HPI  Virtual Visit via Video Note  I connected with Olivia Reyes on 10/17/18 at  9:00 AM EDT by a video enabled telemedicine application and verified that I am speaking with the correct person using two identifiers.  Location: Patient: Home Provider: Home.  Pt did not check bp or pulse today.  Pt on depo provera.   I discussed the limitations of evaluation and management by telemedicine and the availability of in person appointments. The patient expressed understanding and agreed to proceed.  History of Present Illness: Pt has rt ear pain that she notes when she puts pressure on tragus area. This is for 4-5 days. But pain different than ear infection which she gets on occasional. Also some mild ear pressure.    Observations/Objective: General-no acute distress, pleasant, oriented. Lungs- on inspection lungs appear unlabored. Neck- no tracheal deviation or jvd on inspection. Neuro- gross motor function appears intact. heent- rt ear tragal tenderness to palpation.   Assessment and Plan: Probable otitis externa with possible eustachian tube pressure. I rx'd cortisporin otic drops and pt advised to use her flonase nasal spray. Explained making augmentin oral antibiotic available over the weekend /only to start if symptoms become severe over weekend as don't want her to have to be seen in UC or ED unnecessarily during pandemic.  Follow up 7 days or as needed Explained if symptoms don't resolve then office visit would be necessary.  Mackie Pai, PA-C  Follow Up Instructions:    I discussed the assessment and treatment plan with the patient. The patient was provided an opportunity to ask questions and all were answered. The patient agreed with the plan and demonstrated an understanding of the instructions.   The patient was advised to call back or seek an in-person evaluation if  the symptoms worsen or if the condition fails to improve as anticipated.     Mackie Pai, PA-C    Review of Systems     Objective:   Physical Exam        Assessment & Plan:

## 2018-10-24 ENCOUNTER — Other Ambulatory Visit: Payer: Self-pay

## 2018-10-24 ENCOUNTER — Encounter (HOSPITAL_BASED_OUTPATIENT_CLINIC_OR_DEPARTMENT_OTHER): Payer: Self-pay | Admitting: *Deleted

## 2018-10-24 NOTE — Progress Notes (Signed)
Spoke with Olivia Reyes after midnigh food, clear liquids until 430 am and drink pre surgery ensure drink at 430 am then Reyes meds to take: klonopin prn covid test and pre op cbc and type and screen scheduled for 1005 am6-06-2018 Has surgery orders in epic  urine pregnancy needed day of surgery Driver walter Regnier fiance cell [phone]

## 2018-10-28 ENCOUNTER — Other Ambulatory Visit (HOSPITAL_COMMUNITY)
Admission: RE | Admit: 2018-10-28 | Discharge: 2018-10-28 | Disposition: A | Discharge status: Home / Self Care | Payer: BC Managed Care – PPO | Source: Ambulatory Visit | Attending: Obstetrics and Gynecology | Admitting: Obstetrics and Gynecology

## 2018-10-28 ENCOUNTER — Other Ambulatory Visit: Payer: Self-pay

## 2018-10-28 ENCOUNTER — Encounter (HOSPITAL_COMMUNITY)
Admission: RE | Admit: 2018-10-28 | Discharge: 2018-10-28 | Disposition: A | Discharge status: Home / Self Care | Payer: BC Managed Care – PPO | Source: Ambulatory Visit | Attending: Obstetrics and Gynecology | Admitting: Obstetrics and Gynecology

## 2018-10-28 DIAGNOSIS — N814 Uterovaginal prolapse, unspecified: Secondary | ICD-10-CM | POA: Diagnosis not present

## 2018-10-28 DIAGNOSIS — Z1159 Encounter for screening for other viral diseases: Secondary | ICD-10-CM | POA: Diagnosis not present

## 2018-10-28 DIAGNOSIS — E669 Obesity, unspecified: Secondary | ICD-10-CM | POA: Diagnosis not present

## 2018-10-28 DIAGNOSIS — Z79899 Other long term (current) drug therapy: Secondary | ICD-10-CM | POA: Diagnosis not present

## 2018-10-28 DIAGNOSIS — N920 Excessive and frequent menstruation with regular cycle: Secondary | ICD-10-CM | POA: Diagnosis not present

## 2018-10-28 DIAGNOSIS — F419 Anxiety disorder, unspecified: Secondary | ICD-10-CM | POA: Diagnosis not present

## 2018-10-28 DIAGNOSIS — Z302 Encounter for sterilization: Secondary | ICD-10-CM | POA: Diagnosis not present

## 2018-10-28 DIAGNOSIS — Z793 Long term (current) use of hormonal contraceptives: Secondary | ICD-10-CM | POA: Diagnosis not present

## 2018-10-28 LAB — CBC
HCT: 41.6 % (ref 36.0–46.0)
Hemoglobin: 13.2 g/dL (ref 12.0–15.0)
MCH: 29.3 pg (ref 26.0–34.0)
MCHC: 31.7 g/dL (ref 30.0–36.0)
MCV: 92.2 fL (ref 80.0–100.0)
Platelets: 253 10*3/uL (ref 150–400)
RBC: 4.51 MIL/uL (ref 3.87–5.11)
RDW: 12.8 % (ref 11.5–15.5)
WBC: 6.4 10*3/uL (ref 4.0–10.5)
nRBC: 0 % (ref 0.0–0.2)

## 2018-10-28 LAB — ABO/RH: ABO/RH(D): A POS

## 2018-10-29 LAB — NOVEL CORONAVIRUS, NAA (HOSP ORDER, SEND-OUT TO REF LAB; TAT 18-24 HRS): SARS-CoV-2, NAA: NOT DETECTED

## 2018-10-30 NOTE — Progress Notes (Signed)
SPOKE W/  _Randi  Reminder to self quarantine   SCREENING SYMPTOMS OF COVID 19:   COUGH--n  RUNNY NOSE--- n  SORE THROAT---n  NASAL CONGESTION----n  SNEEZING---- n  SHORTNESS OF BREATH---n  DIFFICULTY BREATHING---n  TEMP >100.0 -----n  UNEXPLAINED BODY ACHES------n  CHILLS -------- n HEADACHES ---------n  LOSS OF SMELL/ TASTE --------n    HAVE YOU OR ANY FAMILY MEMBER TRAVELLED PAST 14 DAYS OUT OF THE   COUNTY---n STATE----n COUNTRY----n  HAVE YOU OR ANY FAMILY MEMBER BEEN EXPOSED TO ANYONE WITH COVID 19? n

## 2018-10-30 NOTE — Anesthesia Preprocedure Evaluation (Addendum)
Anesthesia Evaluation  Patient identified by MRN, date of birth, ID band Patient awake    Reviewed: Allergy & Precautions, NPO status , Patient's Chart, lab work & pertinent test results  History of Anesthesia Complications (+) Family history of anesthesia reaction  Airway Mallampati: I  TM Distance: >3 FB Neck ROM: Full    Dental no notable dental hx. (+) Teeth Intact   Pulmonary neg pulmonary ROS, former smoker,    Pulmonary exam normal breath sounds clear to auscultation       Cardiovascular Exercise Tolerance: Good Normal cardiovascular exam Rhythm:Regular Rate:Normal     Neuro/Psych negative neurological ROS     GI/Hepatic negative GI ROS, Neg liver ROS,   Endo/Other    Renal/GU negative Renal ROS     Musculoskeletal negative musculoskeletal ROS (+)   Abdominal   Peds  Hematology negative hematology ROS (+)   Anesthesia Other Findings   Reproductive/Obstetrics negative OB ROS                            Anesthesia Physical Anesthesia Plan  ASA: II  Anesthesia Plan: General   Post-op Pain Management:    Induction: Intravenous, Rapid sequence and Cricoid pressure planned  PONV Risk Score and Plan: 4 or greater and Treatment may vary due to age or medical condition, Ondansetron, Dexamethasone and Scopolamine patch - Pre-op  Airway Management Planned: Oral ETT  Additional Equipment:   Intra-op Plan:   Post-operative Plan: Extubation in OR  Informed Consent: I have reviewed the patients History and Physical, chart, labs and discussed the procedure including the risks, benefits and alternatives for the proposed anesthesia with the patient or authorized representative who has indicated his/her understanding and acceptance.     Dental advisory given  Plan Discussed with: CRNA  Anesthesia Plan Comments:        Anesthesia Quick Evaluation

## 2018-10-31 ENCOUNTER — Ambulatory Visit (HOSPITAL_BASED_OUTPATIENT_CLINIC_OR_DEPARTMENT_OTHER): Payer: BC Managed Care – PPO | Admitting: Physician Assistant

## 2018-10-31 ENCOUNTER — Ambulatory Visit (HOSPITAL_BASED_OUTPATIENT_CLINIC_OR_DEPARTMENT_OTHER)
Admission: RE | Admit: 2018-10-31 | Discharge: 2018-10-31 | Disposition: A | Discharge status: Home / Self Care | Payer: BC Managed Care – PPO | Attending: Obstetrics and Gynecology | Admitting: Obstetrics and Gynecology

## 2018-10-31 ENCOUNTER — Encounter (HOSPITAL_BASED_OUTPATIENT_CLINIC_OR_DEPARTMENT_OTHER): Payer: Self-pay | Admitting: Emergency Medicine

## 2018-10-31 ENCOUNTER — Encounter (HOSPITAL_BASED_OUTPATIENT_CLINIC_OR_DEPARTMENT_OTHER)
Admission: RE | Disposition: A | Discharge status: Home / Self Care | Payer: Self-pay | Source: Home / Self Care | Attending: Obstetrics and Gynecology

## 2018-10-31 ENCOUNTER — Other Ambulatory Visit: Payer: Self-pay

## 2018-10-31 ENCOUNTER — Ambulatory Visit (HOSPITAL_BASED_OUTPATIENT_CLINIC_OR_DEPARTMENT_OTHER): Payer: BC Managed Care – PPO | Admitting: Anesthesiology

## 2018-10-31 DIAGNOSIS — Z793 Long term (current) use of hormonal contraceptives: Secondary | ICD-10-CM | POA: Insufficient documentation

## 2018-10-31 DIAGNOSIS — Z302 Encounter for sterilization: Secondary | ICD-10-CM | POA: Diagnosis not present

## 2018-10-31 DIAGNOSIS — N814 Uterovaginal prolapse, unspecified: Secondary | ICD-10-CM | POA: Insufficient documentation

## 2018-10-31 DIAGNOSIS — E669 Obesity, unspecified: Secondary | ICD-10-CM | POA: Insufficient documentation

## 2018-10-31 DIAGNOSIS — N920 Excessive and frequent menstruation with regular cycle: Secondary | ICD-10-CM | POA: Insufficient documentation

## 2018-10-31 DIAGNOSIS — J358 Other chronic diseases of tonsils and adenoids: Secondary | ICD-10-CM | POA: Diagnosis not present

## 2018-10-31 DIAGNOSIS — Z79899 Other long term (current) drug therapy: Secondary | ICD-10-CM | POA: Insufficient documentation

## 2018-10-31 DIAGNOSIS — F419 Anxiety disorder, unspecified: Secondary | ICD-10-CM | POA: Insufficient documentation

## 2018-10-31 DIAGNOSIS — Z5309 Procedure and treatment not carried out because of other contraindication: Secondary | ICD-10-CM | POA: Diagnosis not present

## 2018-10-31 HISTORY — DX: Family history of other specified conditions: Z84.89

## 2018-10-31 HISTORY — PX: DILITATION & CURRETTAGE/HYSTROSCOPY WITH HYDROTHERMAL ABLATION: SHX5570

## 2018-10-31 HISTORY — PX: LAPAROSCOPIC TUBAL LIGATION: SHX1937

## 2018-10-31 LAB — TYPE AND SCREEN
ABO/RH(D): A POS
Antibody Screen: NEGATIVE

## 2018-10-31 LAB — POCT PREGNANCY, URINE: Preg Test, Ur: NEGATIVE

## 2018-10-31 SURGERY — LIGATION, FALLOPIAN TUBE, LAPAROSCOPIC
Anesthesia: General | Site: Uterus

## 2018-10-31 MED ORDER — GABAPENTIN 100 MG PO CAPS
100.0000 mg | ORAL_CAPSULE | Freq: Once | ORAL | Status: DC
  Filled 2018-10-31: qty 1

## 2018-10-31 MED ORDER — SCOPOLAMINE 1 MG/3DAYS TD PT72
MEDICATED_PATCH | TRANSDERMAL | Status: AC
  Filled 2018-10-31: qty 1

## 2018-10-31 MED ORDER — LIDOCAINE HCL 1 % IJ SOLN
INTRAMUSCULAR | Status: DC | PRN
  Administered 2018-10-31: 10 mL

## 2018-10-31 MED ORDER — KETOROLAC TROMETHAMINE 30 MG/ML IJ SOLN
30.0000 mg | Freq: Once | INTRAMUSCULAR | Status: DC | PRN
  Filled 2018-10-31: qty 1

## 2018-10-31 MED ORDER — ACETAMINOPHEN 500 MG PO TABS
1000.0000 mg | ORAL_TABLET | Freq: Once | ORAL | Status: AC
  Administered 2018-10-31: 1000 mg via ORAL
  Filled 2018-10-31: qty 2

## 2018-10-31 MED ORDER — HYDROMORPHONE HCL 1 MG/ML IJ SOLN
0.2500 mg | INTRAMUSCULAR | Status: DC | PRN
  Administered 2018-10-31 (×2): 0.25 mg via INTRAVENOUS
  Filled 2018-10-31: qty 0.5

## 2018-10-31 MED ORDER — MIDAZOLAM HCL 5 MG/5ML IJ SOLN
INTRAMUSCULAR | Status: DC | PRN
  Administered 2018-10-31: 2 mg via INTRAVENOUS

## 2018-10-31 MED ORDER — FENTANYL CITRATE (PF) 100 MCG/2ML IJ SOLN
INTRAMUSCULAR | Status: DC | PRN
  Administered 2018-10-31 (×2): 100 ug via INTRAVENOUS

## 2018-10-31 MED ORDER — DEXAMETHASONE SODIUM PHOSPHATE 10 MG/ML IJ SOLN
INTRAMUSCULAR | Status: AC
  Filled 2018-10-31: qty 1

## 2018-10-31 MED ORDER — LIDOCAINE 2% (20 MG/ML) 5 ML SYRINGE
INTRAMUSCULAR | Status: AC
  Filled 2018-10-31: qty 5

## 2018-10-31 MED ORDER — ONDANSETRON HCL 4 MG/2ML IJ SOLN
INTRAMUSCULAR | Status: AC
  Filled 2018-10-31: qty 2

## 2018-10-31 MED ORDER — SUGAMMADEX SODIUM 500 MG/5ML IV SOLN
INTRAVENOUS | Status: AC
  Filled 2018-10-31: qty 10

## 2018-10-31 MED ORDER — ONDANSETRON HCL 4 MG/2ML IJ SOLN
INTRAMUSCULAR | Status: DC | PRN
  Administered 2018-10-31: 4 mg via INTRAVENOUS

## 2018-10-31 MED ORDER — GABAPENTIN 300 MG PO CAPS
ORAL_CAPSULE | ORAL | Status: AC
  Filled 2018-10-31: qty 1

## 2018-10-31 MED ORDER — FENTANYL CITRATE (PF) 100 MCG/2ML IJ SOLN
INTRAMUSCULAR | Status: AC
  Filled 2018-10-31: qty 2

## 2018-10-31 MED ORDER — LACTATED RINGERS IV SOLN
INTRAVENOUS | Status: DC
  Administered 2018-10-31 (×2): via INTRAVENOUS
  Filled 2018-10-31: qty 1000

## 2018-10-31 MED ORDER — ROCURONIUM BROMIDE 10 MG/ML (PF) SYRINGE
PREFILLED_SYRINGE | INTRAVENOUS | Status: AC
  Filled 2018-10-31: qty 10

## 2018-10-31 MED ORDER — SODIUM CHLORIDE 0.9 % IR SOLN
Status: DC | PRN
  Administered 2018-10-31: 1000 mL

## 2018-10-31 MED ORDER — HYDROCODONE-ACETAMINOPHEN 7.5-325 MG PO TABS
1.0000 | ORAL_TABLET | Freq: Once | ORAL | Status: AC | PRN
  Administered 2018-10-31: 1 via ORAL
  Filled 2018-10-31: qty 1

## 2018-10-31 MED ORDER — ACETAMINOPHEN 500 MG PO TABS
ORAL_TABLET | ORAL | Status: AC
  Filled 2018-10-31: qty 2

## 2018-10-31 MED ORDER — KETOROLAC TROMETHAMINE 30 MG/ML IJ SOLN
INTRAMUSCULAR | Status: DC | PRN
  Administered 2018-10-31: 30 mg via INTRAVENOUS

## 2018-10-31 MED ORDER — PROPOFOL 10 MG/ML IV BOLUS
INTRAVENOUS | Status: DC | PRN
  Administered 2018-10-31: 160 mg via INTRAVENOUS

## 2018-10-31 MED ORDER — MIDAZOLAM HCL 2 MG/2ML IJ SOLN
INTRAMUSCULAR | Status: AC
  Filled 2018-10-31: qty 2

## 2018-10-31 MED ORDER — DEXAMETHASONE SODIUM PHOSPHATE 4 MG/ML IJ SOLN
INTRAMUSCULAR | Status: DC | PRN
  Administered 2018-10-31: 10 mg via INTRAVENOUS

## 2018-10-31 MED ORDER — SUGAMMADEX SODIUM 200 MG/2ML IV SOLN
INTRAVENOUS | Status: DC | PRN
  Administered 2018-10-31: 500 mg via INTRAVENOUS

## 2018-10-31 MED ORDER — SODIUM CHLORIDE 0.9 % IV SOLN
INTRAVENOUS | Status: AC
  Filled 2018-10-31: qty 2

## 2018-10-31 MED ORDER — ONDANSETRON HCL 4 MG/2ML IJ SOLN
4.0000 mg | Freq: Once | INTRAMUSCULAR | Status: DC | PRN
  Filled 2018-10-31: qty 2

## 2018-10-31 MED ORDER — MEPERIDINE HCL 25 MG/ML IJ SOLN
6.2500 mg | INTRAMUSCULAR | Status: DC | PRN
  Filled 2018-10-31: qty 1

## 2018-10-31 MED ORDER — BUPIVACAINE HCL (PF) 0.25 % IJ SOLN
INTRAMUSCULAR | Status: DC | PRN
  Administered 2018-10-31: 10 mL

## 2018-10-31 MED ORDER — LACTATED RINGERS IV SOLN
INTRAVENOUS | Status: DC
  Filled 2018-10-31: qty 1000

## 2018-10-31 MED ORDER — KETOROLAC TROMETHAMINE 30 MG/ML IJ SOLN
INTRAMUSCULAR | Status: AC
  Filled 2018-10-31: qty 1

## 2018-10-31 MED ORDER — HYDROCODONE-ACETAMINOPHEN 7.5-325 MG PO TABS
ORAL_TABLET | ORAL | Status: AC
  Filled 2018-10-31: qty 1

## 2018-10-31 MED ORDER — SCOPOLAMINE 1 MG/3DAYS TD PT72
1.0000 | MEDICATED_PATCH | TRANSDERMAL | Status: DC
  Administered 2018-10-31: 1.5 mg via TRANSDERMAL
  Filled 2018-10-31: qty 1

## 2018-10-31 MED ORDER — GABAPENTIN 300 MG PO CAPS
300.0000 mg | ORAL_CAPSULE | Freq: Once | ORAL | Status: AC
  Administered 2018-10-31: 300 mg via ORAL
  Filled 2018-10-31: qty 1

## 2018-10-31 MED ORDER — HYDROMORPHONE HCL 1 MG/ML IJ SOLN
INTRAMUSCULAR | Status: AC
  Filled 2018-10-31: qty 1

## 2018-10-31 MED ORDER — PROPOFOL 10 MG/ML IV BOLUS
INTRAVENOUS | Status: AC
  Filled 2018-10-31: qty 40

## 2018-10-31 MED ORDER — SODIUM CHLORIDE 0.9 % IV SOLN
2.0000 g | INTRAVENOUS | Status: AC
  Administered 2018-10-31: 2 g via INTRAVENOUS
  Filled 2018-10-31: qty 2

## 2018-10-31 MED ORDER — LIDOCAINE HCL (CARDIAC) PF 100 MG/5ML IV SOSY
PREFILLED_SYRINGE | INTRAVENOUS | Status: DC | PRN
  Administered 2018-10-31: 100 mg via INTRAVENOUS

## 2018-10-31 MED ORDER — ROCURONIUM BROMIDE 100 MG/10ML IV SOLN
INTRAVENOUS | Status: DC | PRN
  Administered 2018-10-31: 100 mg via INTRAVENOUS

## 2018-10-31 SURGICAL SUPPLY — 51 items
CABLE HIGH FREQUENCY MONO STRZ (ELECTRODE) IMPLANT
CANISTER SUCT 3000ML PPV (MISCELLANEOUS) IMPLANT
CATH ROBINSON RED A/P 16FR (CATHETERS) ×3 IMPLANT
COUNTER NEEDLE 1200 MAGNETIC (NEEDLE) ×3 IMPLANT
COVER MAYO STAND STRL (DRAPES) IMPLANT
COVER WAND RF STERILE (DRAPES) ×3 IMPLANT
DERMABOND ADVANCED (GAUZE/BANDAGES/DRESSINGS) ×1
DERMABOND ADVANCED .7 DNX12 (GAUZE/BANDAGES/DRESSINGS) IMPLANT
DILATOR CANAL MILEX (MISCELLANEOUS) IMPLANT
DRSG COVADERM PLUS 2X2 (GAUZE/BANDAGES/DRESSINGS) IMPLANT
DRSG TELFA 3X8 NADH (GAUZE/BANDAGES/DRESSINGS) IMPLANT
DURAPREP 26ML APPLICATOR (WOUND CARE) ×3 IMPLANT
ELECT REM PT RETURN 9FT ADLT (ELECTROSURGICAL) ×3
ELECTRODE REM PT RTRN 9FT ADLT (ELECTROSURGICAL) ×2 IMPLANT
GAUZE 4X4 16PLY RFD (DISPOSABLE) ×3 IMPLANT
GLOVE BIO SURGEON STRL SZ 6.5 (GLOVE) ×6 IMPLANT
GOWN STRL REUS W/TWL LRG LVL3 (GOWN DISPOSABLE) ×3 IMPLANT
IV NS IRRIG 3000ML ARTHROMATIC (IV SOLUTION) ×6 IMPLANT
KIT PROCEDURE FLUENT (KITS) ×1 IMPLANT
KIT TURNOVER CYSTO (KITS) ×3 IMPLANT
NDL HYPO 25X1 1.5 SAFETY (NEEDLE) IMPLANT
NDL INSUFFLATION 14GA 150MM (NEEDLE) IMPLANT
NEEDLE HYPO 25X1 1.5 SAFETY (NEEDLE) IMPLANT
NEEDLE INSUFFLATION 120MM (ENDOMECHANICALS) ×3 IMPLANT
NEEDLE INSUFFLATION 14GA 150MM (NEEDLE) IMPLANT
NS IRRIG 500ML POUR BTL (IV SOLUTION) ×3 IMPLANT
PACK LAPAROSCOPY BASIN (CUSTOM PROCEDURE TRAY) ×3 IMPLANT
PACK TRENDGUARD 450 HYBRID PRO (MISCELLANEOUS) IMPLANT
PACK VAGINAL MINOR WOMEN LF (CUSTOM PROCEDURE TRAY) ×3 IMPLANT
PAD DRESSING TELFA 3X8 NADH (GAUZE/BANDAGES/DRESSINGS) IMPLANT
PAD OB MATERNITY 4.3X12.25 (PERSONAL CARE ITEMS) ×3 IMPLANT
SEALER TISSUE G2 CVD JAW 45CM (ENDOMECHANICALS) IMPLANT
SET GENESYS HTA PROCERVA (MISCELLANEOUS) ×1 IMPLANT
SET IRRIG TUBING LAPAROSCOPIC (IRRIGATION / IRRIGATOR) IMPLANT
STRIP CLOSURE SKIN 1/4X4 (GAUZE/BANDAGES/DRESSINGS) IMPLANT
SUT MNCRL AB 3-0 PS2 18 (SUTURE) ×3 IMPLANT
SUT VIC AB 3-0 FS2 27 (SUTURE) IMPLANT
SUT VIC AB 3-0 PS2 18 (SUTURE)
SUT VIC AB 3-0 PS2 18XBRD (SUTURE) IMPLANT
SUT VICRYL 0 UR6 27IN ABS (SUTURE) ×3 IMPLANT
TOWEL OR 17X26 10 PK STRL BLUE (TOWEL DISPOSABLE) ×6 IMPLANT
TRAY FOLEY W/BAG SLVR 14FR (SET/KITS/TRAYS/PACK) ×3 IMPLANT
TRENDGUARD 450 HYBRID PRO PACK (MISCELLANEOUS) ×3
TROCAR OPTI TIP 5M 100M (ENDOMECHANICALS) IMPLANT
TROCAR XCEL BLUNT TIP 100MML (ENDOMECHANICALS) ×1 IMPLANT
TROCAR XCEL DIL TIP R 11M (ENDOMECHANICALS) ×3 IMPLANT
TUBING AQUILEX INFLOW (TUBING) IMPLANT
TUBING AQUILEX OUTFLOW (TUBING) IMPLANT
TUBING EVAC SMOKE HEATED PNEUM (TUBING) ×3 IMPLANT
WARMER LAPAROSCOPE (MISCELLANEOUS) ×3 IMPLANT
WATER STERILE IRR 500ML POUR (IV SOLUTION) ×3 IMPLANT

## 2018-10-31 NOTE — Progress Notes (Signed)
Patient doing well. No complaints.  BP 130/77   Pulse 69   Temp 97.9 F (36.6 C) (Oral)   Resp 16   Ht 5\' 11"  (1.803 m)   Wt 104.7 kg   SpO2 99%   BMI 32.19 kg/m  Results for orders placed or performed during the hospital encounter of 10/31/18 (from the past 24 hour(s))  Pregnancy, urine POC     Status: None   Collection Time: 10/31/18  6:14 AM  Result Value Ref Range   Preg Test, Ur NEGATIVE NEGATIVE   Will proceed with  Laparoscopic BTL Hysteroscopy D and C HTA

## 2018-10-31 NOTE — Transfer of Care (Signed)
Immediate Anesthesia Transfer of Care Note  Patient: Olivia Reyes  Procedure(s) Performed: attemted LAPAROSCOPIC TUBAL LIGATION (Bilateral Abdomen) DILATATION & CURETTAGE/HYSTEROSCOPY WITH attempted HYDROTHERMAL ABLATION (N/A Uterus)  Patient Location: PACU  Anesthesia Type:General  Level of Consciousness: awake, alert  and oriented  Airway & Oxygen Therapy: Patient Spontanous Breathing and Patient connected to nasal cannula oxygen  Post-op Assessment: Report given to RN  Post vital signs: Reviewed and stable  Last Vitals:  Vitals Value Taken Time  BP 107/60 10/31/2018  8:50 AM  Temp    Pulse 63 10/31/2018  8:54 AM  Resp 15 10/31/2018  8:54 AM  SpO2 95 % 10/31/2018  8:54 AM  Vitals shown include unvalidated device data.  Last Pain:  Vitals:   10/31/18 0539  TempSrc: Oral  PainSc: 4          Complications: No apparent anesthesia complications

## 2018-10-31 NOTE — Brief Op Note (Signed)
10/31/2018  8:32 AM  PATIENT:  Olivia Reyes  33 y.o. female  PRE-OPERATIVE DIAGNOSIS:   Desires Permanent sterilization  POST-OPERATIVE DIAGNOSIS:  Same  Uterine prolapse Possible intraabdominal adhesions  PROCEDURE:  Procedure(s): attemted LAPAROSCOPIC TUBAL LIGATION (Bilateral) DILATATION & CURETTAGE/HYSTEROSCOPY WITH attempted HYDROTHERMAL ABLATION (N/A)   1.  Attempted Diagnostic Laparoscopy 2.  Hysteroscopy 3. Dilation and Uterine curettage 3.  Abandoned endometrial ablation possibly due to occult uterine perforation or secondary to large intrauterine cavity  SURGEON:  Surgeon(s) and Role:    * Dian Queen, MD - Primary  PHYSICIAN ASSISTANT:   ASSISTANTS: none   ANESTHESIA:   general and paracervical block  EBL:  Minimal   BLOOD ADMINISTERED:none  DRAINS: none   LOCAL MEDICATIONS USED:  LIDOCAINE   SPECIMEN:  Source of Specimen:  uterine curettings  DISPOSITION OF SPECIMEN:  PATHOLOGY  COUNTS:  YES  TOURNIQUET:  * No tourniquets in log *  DICTATION: .Other Dictation: Dictation Number dictated  PLAN OF CARE: Discharge to home after PACU  PATIENT DISPOSITION:  PACU - hemodynamically stable.   Delay start of Pharmacological VTE agent (>24hrs) due to surgical blood loss or risk of bleeding: not applicable

## 2018-10-31 NOTE — Anesthesia Postprocedure Evaluation (Signed)
Anesthesia Post Note  Patient: Olivia Reyes  Procedure(s) Performed: attemted LAPAROSCOPIC TUBAL LIGATION (Bilateral Abdomen) DILATATION & CURETTAGE/HYSTEROSCOPY WITH attempted HYDROTHERMAL ABLATION (N/A Uterus)     Patient location during evaluation: PACU Anesthesia Type: General Level of consciousness: awake and alert Pain management: pain level controlled Vital Signs Assessment: post-procedure vital signs reviewed and stable Respiratory status: spontaneous breathing, nonlabored ventilation, respiratory function stable and patient connected to nasal cannula oxygen Cardiovascular status: blood pressure returned to baseline and stable Postop Assessment: no apparent nausea or vomiting Anesthetic complications: no    Last Vitals:  Vitals:   10/31/18 1051 10/31/18 1052  BP: (!) 103/38 (!) 101/56  Pulse: (!) 50 (!) 50  Resp: 16   Temp: 36.9 C   SpO2: 98%     Last Pain:  Vitals:   10/31/18 1051  TempSrc:   PainSc: 2                  Barnet Glasgow

## 2018-10-31 NOTE — Anesthesia Procedure Notes (Signed)
Procedure Name: Intubation Date/Time: 10/31/2018 7:35 AM Performed by: Bonney Aid, CRNA Pre-anesthesia Checklist: Patient identified, Emergency Drugs available, Suction available and Patient being monitored Patient Re-evaluated:Patient Re-evaluated prior to induction Oxygen Delivery Method: Circle system utilized Preoxygenation: Pre-oxygenation with 100% oxygen Induction Type: IV induction Ventilation: Mask ventilation without difficulty Laryngoscope Size: Mac and 3 Grade View: Grade I Tube type: Oral Tube size: 7.5 mm Number of attempts: 1 Airway Equipment and Method: Stylet and Oral airway Placement Confirmation: ETT inserted through vocal cords under direct vision,  positive ETCO2 and breath sounds checked- equal and bilateral Secured at: 21 cm Tube secured with: Tape Dental Injury: Teeth and Oropharynx as per pre-operative assessment

## 2018-10-31 NOTE — Discharge Instructions (Signed)
° ° ° °  Do not take any nonsteroidal anti nonflammatories until after 1:45 pm today.     Post Anesthesia Home Care Instructions  Activity: Get plenty of rest for the remainder of the day. A responsible individual must stay with you for 24 hours following the procedure.  For the next 24 hours, DO NOT: -Drive a car -Paediatric nurse -Drink alcoholic beverages -Take any medication unless instructed by your physician -Make any legal decisions or sign important papers.  Meals: Start with liquid foods such as gelatin or soup. Progress to regular foods as tolerated. Avoid greasy, spicy, heavy foods. If nausea and/or vomiting occur, drink only clear liquids until the nausea and/or vomiting subsides. Call your physician if vomiting continues.  Special Instructions/Symptoms: Your throat may feel dry or sore from the anesthesia or the breathing tube placed in your throat during surgery. If this causes discomfort, gargle with warm salt water. The discomfort should disappear within 24 hours.

## 2018-11-03 ENCOUNTER — Encounter (HOSPITAL_BASED_OUTPATIENT_CLINIC_OR_DEPARTMENT_OTHER): Payer: Self-pay | Admitting: Obstetrics and Gynecology

## 2018-11-05 NOTE — Op Note (Signed)
Olivia, Reyes MEDICAL RECORD [id number] ACCOUNT [id number] DATE OF BIRTH:28-Nov-1985 FACILITY: [facility] LOCATION: [facility] PHYSICIAN:Norvel Wenker Lynett Fish, MD  OPERATIVE REPORT  DATE OF PROCEDURE:  10/31/2018  PREOPERATIVE DIAGNOSIS:  Desires permanent sterilization and menorrhagia.  POSTOPERATIVE DIAGNOSIS:  Desires permanent sterilization and menorrhagia.  Possible intraabdominal adhesions present prior to surgery, possible occult uterine perforation, pelvic organ prolapse.  PROCEDURE:  Attempted diagnostic laparoscopy hysteroscope with dilatation and curettage and attempt at hydrothermal ablation.  SURGEON:  Dian Queen, MD   ANESTHESIA:  General.  ESTIMATED BLOOD LOSS:  Minimal.  COMPLICATIONS:  Possible occult prolapse and probable intraabdominal adhesions present prior to surgery.    DESCRIPTION OF PROCEDURE:  The patient was taken to the operating room.  She was intubated without difficulty.  She was then prepped and draped in the usual sterile fashion.  An in and out catheter was used to empty the bladder.  Upon exam under  anesthesia, I did see that she had about a grade II-III cervical uterine prolapse.  At this point, we went up to the umbilicus and made a small incision at the umbilicus.  The Veress needle was then inserted, and upon introduction with the Veress needle,  I could feel that there was some resistance of the fascia with some scar tissue.  We inserted the Veress needle, and intraperitoneal placement was confirmed; however, with opening pressure, I could see that her opening pressure was high and that the  insufflation did not appear to be going inferiorly.  My concern was that she may have intraabdominal adhesions.  I removed the Veress needle.  Then I decided to do an open laparoscopy because of the possibility of adhesions, so I grasped the fascia and  used Mayo scissors.  I could see that the fascia just beneath the umbilicus was extremely thickened  and that they felt like there was a bulge just beneath this.  This thickening was very fibrotic, and I felt very uncomfortable proceeding further with  laparoscopy because of the fear there might be some small bowel adhesions beneath the incision.  At this point, I decided to abandon the laparoscopy for fear of an intraabdominal injury.  So we closed that incision with suture and then closed the skin  with Dermabond.  At this point, we went down to the vagina, and again I noticed the cervical uterine prolapse.  I inserted a speculum in the vagina, grasped the cervix with a tenaculum, and the cervical internal os was gently dilated using Pratt  dilators.  We then inserted the hysteroscope, and upon entry, I noticed that her uterus was sharply anteflexed and I could not straighten it to the midline position.  It almost felt like there was something hugging the uterus on the other side, and I  could see that there was some scarring at the fundal region.  It did not look like a through-and-through perforation; however, it may have been indicative of some type of adhesion.  We then attached the HTA and attempted to do an HTA; however, there was  a flow fluid leak noted x3, and we were not able to perform the HTA.  At this point, I removed the instrument and inserted a curette and curetted the uterus thoroughly.  My concern at the end of the case is: 1.  She has intraabdominal adhesions. 2.  There was a possible occult prolapse, possibly contributed to possible adhesions in her pelvis. 3.  She had pelvic organ prolapse.  At the end of the  procedure, she was extubated and went to recovery room.  Upon questioning her further after she awoke from anesthesia, it appears that she had a laparoscopic cholecystectomy many years ago.  She states it was a complicated procedure  because they stated that her gallbladder had "rotted " inside, and she had to stay in the hospital for several days and her recovery was very  prolonged.  She also reports that she has had some pressure and pain in her pelvis, possibly due to the  prolapse.  At this point, given that she wanted sterilization, we discussed her options.  Given the possibility of adhesions and the discomfort she has been having, she decided to proceed with possible hysterectomy.  I think she will need a total  abdominal hysterectomy because I cannot do a laparoscopy on her, possible lysis of adhesions, possible bilateral salpingectomy, and she is going to consider that.  I will speak with her fiance, and we will discuss it further.  After a period of  observation, she was sent home.  LN/NUANCE  D:11/05/2018 T:11/05/2018 JOB:[id number]

## 2018-11-21 NOTE — Progress Notes (Signed)
[id number] [address], [place] [address] [place] [address] Phone: [phone] Fax: [phone]      Your procedure is scheduled on Thursday, 7/2.  Report to [facility] Main Entrance "A" at 5:30 A.M., and check in at the Admitting office.  Call this number if you have problems the morning of surgery:  [phone]  Call [phone] if you have any questions prior to your surgery date Monday-Friday 8am-4pm    Remember:  Do not eat or drink after midnight Wednesday.  You may drink clear liquids until 4:30 am Thursday .   Clear liquids allowed are: Water, Non-Citrus Juices (without pulp), Carbonated Beverages, Clear Tea, Black Coffee Only, and Gatorade  Please complete your PRE-SURGERY ENSURE that was provided to you 3 hours prior to you surgery start time.  Please, if able, drink it in one setting. DO NOT SIP.     Take these medicines the morning of surgery with A SIP OF WATER: clonazePAM (KLONOPIN) if needed   7 days prior to surgery STOP taking any Aspirin (unless otherwise instructed by your surgeon), Aleve, Naproxen, Ibuprofen, Motrin, Advil, Goody's, BC's, all herbal medications, fish oil, and all vitamins.    The Morning of Surgery  Do not wear jewelry, make-up or nail polish.  Do not wear lotions, powders, or perfumes, or deodorant  Do not shave 48 hours prior to surgery.   Do not bring valuables to the hospital.  [facility] is not responsible for any belongings or valuables.  If you are a smoker, DO NOT Smoke 24 hours prior to surgery.  IF you wear a CPAP at night please bring your mask, tubing, and machine the morning of surgery   Remember that you must have someone to transport you home after your surgery, and remain with you for 24 hours if you are discharged the same day.  Patients discharged the day of surgery will not be allowed to drive home.  Contacts, glasses, hearing aids, dentures or bridgework may not be worn  into surgery.   For patients admitted to the hospital, discharge time will be determined by your treatment team.  Patients discharged the day of surgery will not be allowed to drive home.    Special instructions:   [facility]- Preparing For Surgery  Before surgery, you can play an important role. Because skin is not sterile, your skin needs to be as free of germs as possible. You can reduce the number of germs on your skin by washing with CHG (chlorahexidine gluconate) Soap before surgery.  CHG is an antiseptic cleaner which kills germs and bonds with the skin to continue killing germs even after washing.    Oral Hygiene is also important to reduce your risk of infection.  Remember - BRUSH YOUR TEETH THE MORNING OF SURGERY WITH YOUR REGULAR TOOTHPASTE  Please do not use if you have an allergy to CHG or antibacterial soaps. If your skin becomes reddened/irritated stop using the CHG.   Do not shave (including legs and underarms) for at least 48 hours prior to first CHG shower. It is OK to shave your face.  Please follow these instructions carefully.   1. Shower the [employer] BEFORE SURGERY (Wed) and the MORNING OF SURGERY (Thurs) with CHG Soap.   2. If you chose to wash your hair, wash your hair first as usual with your normal shampoo.  3. After you shampoo, rinse your hair and body thoroughly to remove the shampoo.  4. Use CHG  as you would any other liquid soap. You can apply CHG directly to the skin and wash gently with a scrungie or a clean washcloth.   5. Apply the CHG Soap to your body ONLY FROM THE NECK DOWN.  Do not use on open wounds or open sores. Avoid contact with your eyes, ears, mouth and genitals (private parts). Wash Face and genitals (private parts)  with your normal soap.   6. Wash thoroughly, paying special attention to the area where your surgery will be performed.  7. Thoroughly rinse your body with warm water from the neck down.  8. DO NOT shower/wash with your  normal soap after using and rinsing off the CHG Soap.  9. Pat yourself dry with a CLEAN TOWEL.  10. Wear CLEAN PAJAMAS to bed the night before surgery, wear comfortable clothes the morning of surgery  11. Place CLEAN SHEETS on your bed the night of your first shower and DO NOT SLEEP WITH PETS.    Day of Surgery:  Do not apply any deodorants/lotions.  Please wear clean clothes to the hospital/surgery center.   Remember to brush your teeth WITH YOUR REGULAR TOOTHPASTE.   Please read over the following fact sheets that you were given.

## 2018-11-24 ENCOUNTER — Encounter (HOSPITAL_COMMUNITY)
Admission: RE | Admit: 2018-11-24 | Discharge: 2018-11-24 | Disposition: A | Discharge status: Home / Self Care | Payer: BC Managed Care – PPO | Source: Ambulatory Visit | Attending: Obstetrics and Gynecology | Admitting: Obstetrics and Gynecology

## 2018-11-24 ENCOUNTER — Other Ambulatory Visit: Payer: Self-pay

## 2018-11-24 ENCOUNTER — Other Ambulatory Visit (HOSPITAL_COMMUNITY)
Admission: RE | Admit: 2018-11-24 | Discharge: 2018-11-24 | Disposition: A | Discharge status: Home / Self Care | Payer: BC Managed Care – PPO | Source: Ambulatory Visit | Attending: Obstetrics and Gynecology | Admitting: Obstetrics and Gynecology

## 2018-11-24 ENCOUNTER — Encounter (HOSPITAL_COMMUNITY): Payer: Self-pay

## 2018-11-24 DIAGNOSIS — M9901 Segmental and somatic dysfunction of cervical region: Secondary | ICD-10-CM | POA: Diagnosis not present

## 2018-11-24 DIAGNOSIS — Z01812 Encounter for preprocedural laboratory examination: Secondary | ICD-10-CM | POA: Insufficient documentation

## 2018-11-24 DIAGNOSIS — Z1159 Encounter for screening for other viral diseases: Secondary | ICD-10-CM | POA: Diagnosis not present

## 2018-11-24 DIAGNOSIS — M9902 Segmental and somatic dysfunction of thoracic region: Secondary | ICD-10-CM | POA: Diagnosis not present

## 2018-11-24 DIAGNOSIS — M9904 Segmental and somatic dysfunction of sacral region: Secondary | ICD-10-CM | POA: Diagnosis not present

## 2018-11-24 LAB — SARS CORONAVIRUS 2 (TAT 6-24 HRS): SARS Coronavirus 2: NEGATIVE

## 2018-11-24 LAB — TYPE AND SCREEN
ABO/RH(D): A POS
Antibody Screen: NEGATIVE

## 2018-11-24 LAB — CBC
HCT: 40.9 % (ref 36.0–46.0)
Hemoglobin: 13.7 g/dL (ref 12.0–15.0)
MCH: 29.9 pg (ref 26.0–34.0)
MCHC: 33.5 g/dL (ref 30.0–36.0)
MCV: 89.3 fL (ref 80.0–100.0)
Platelets: 266 10*3/uL (ref 150–400)
RBC: 4.58 MIL/uL (ref 3.87–5.11)
RDW: 12.9 % (ref 11.5–15.5)
WBC: 5.2 10*3/uL (ref 4.0–10.5)
nRBC: 0 % (ref 0.0–0.2)

## 2018-11-24 LAB — ABO/RH: ABO/RH(D): A POS

## 2018-11-24 NOTE — Progress Notes (Signed)
PCP - Silvestre Mesi Cardiologist - denies  Chest x-ray - N/A EKG - N/A Stress Test -denies  ECHO - denies Cardiac Cath - denies  Sleep Study - N/A CPAP - denies  Blood Thinner Instructions:N/A Aspirin Instructions:N/A  Anesthesia review: No  Patient denies shortness of breath, fever, cough and chest pain at PAT appointment   Patient verbalized understanding of instructions that were given to them at the PAT appointment. Patient was also instructed that they will need to review over the PAT instructions again at home before surgery.  Scheduled for Covid screening today after PAT appointment.    Coronavirus Screening  Have you experienced the following symptoms:  Cough yes/no: No Fever (>100.1F)  yes/no: No Runny nose yes/no: No Sore throat yes/no: No Difficulty breathing/shortness of breath  yes/no: No  Have you or a family member traveled in the last 14 days and where? yes/no: No   If the patient indicates "YES" to the above questions, their PAT will be rescheduled to limit the exposure to others and, the surgeon will be notified. THE PATIENT WILL NEED TO BE ASYMPTOMATIC FOR 14 DAYS.   If the patient is not experiencing any of these symptoms, the PAT nurse will instruct them to NOT bring anyone with them to their appointment since they may have these symptoms or traveled as well.   Please remind your patients and families that hospital visitation restrictions are in effect and the importance of the restrictions.

## 2018-11-24 NOTE — Progress Notes (Signed)
[id number] [address], [place] - [address] [address] Phone: [phone] Fax: [phone]      Your procedure is scheduled on Thursday, July 2nd.  Report to [facility] Main Entrance "A" at 5:30 A.M., and check in at the Admitting office.  Call this number if you have problems the morning of surgery:  [phone]  Call [phone] if you have any questions prior to your surgery date Monday-Friday 8am-4pm    Remember:  Do not eat or drink after midnight Wednesday.  You may drink clear liquids until 4:30 am Thursday .   Clear liquids allowed are: Water, Non-Citrus Juices (without pulp), Carbonated Beverages, Clear Tea, Black Coffee Only, and Gatorade     Take these medicines the morning of surgery with A SIP OF WATER: clonazePAM (KLONOPIN) if needed   As of today, STOP taking any Aspirin (unless otherwise instructed by your surgeon), Aleve, Naproxen, Ibuprofen, Motrin, Advil, Goody's, BC's, all herbal medications, fish oil, and all vitamins.    The Morning of Surgery  Do not wear jewelry, make-up or nail polish.  Do not wear lotions, powders, or perfumes, or deodorant  Do not shave 48 hours prior to surgery.   Do not bring valuables to the hospital.  [facility] is not responsible for any belongings or valuables.  If you are a smoker, DO NOT Smoke 24 hours prior to surgery.  IF you wear a CPAP at night please bring your mask, tubing, and machine the morning of surgery   Remember that you must have someone to transport you home after your surgery, and remain with you for 24 hours if you are discharged the same day.  Patients discharged the day of surgery will not be allowed to drive home.  Contacts, glasses, hearing aids, dentures or bridgework may not be worn into surgery.   For patients admitted to the hospital, discharge time will be determined by your treatment team.  Patients discharged the day of surgery will not be  allowed to drive home.    Special instructions:   [facility]- Preparing For Surgery  Before surgery, you can play an important role. Because skin is not sterile, your skin needs to be as free of germs as possible. You can reduce the number of germs on your skin by washing with CHG (chlorahexidine gluconate) Soap before surgery.  CHG is an antiseptic cleaner which kills germs and bonds with the skin to continue killing germs even after washing.    Oral Hygiene is also important to reduce your risk of infection.  Remember - BRUSH YOUR TEETH THE MORNING OF SURGERY WITH YOUR REGULAR TOOTHPASTE  Please do not use if you have an allergy to CHG or antibacterial soaps. If your skin becomes reddened/irritated stop using the CHG.   Do not shave (including legs and underarms) for at least 48 hours prior to first CHG shower. It is OK to shave your face.  Please follow these instructions carefully.   1. Shower the [employer] BEFORE SURGERY (Wed) and the MORNING OF SURGERY (Thurs) with CHG Soap.   2. If you chose to wash your hair, wash your hair first as usual with your normal shampoo.  3. After you shampoo, rinse your hair and body thoroughly to remove the shampoo.  4. Use CHG as you would any other liquid soap. You can apply CHG directly to the skin and wash gently with a scrungie or a clean washcloth.   5. Apply the CHG Soap  to your body ONLY FROM THE NECK DOWN.  Do not use on open wounds or open sores. Avoid contact with your eyes, ears, mouth and genitals (private parts). Wash Face and genitals (private parts)  with your normal soap.   6. Wash thoroughly, paying special attention to the area where your surgery will be performed.  7. Thoroughly rinse your body with warm water from the neck down.  8. DO NOT shower/wash with your normal soap after using and rinsing off the CHG Soap.  9. Pat yourself dry with a CLEAN TOWEL.  10. Wear CLEAN PAJAMAS to bed the night before surgery, wear comfortable  clothes the morning of surgery  11. Place CLEAN SHEETS on your bed the night of your first shower and DO NOT SLEEP WITH PETS.    Day of Surgery:  Do not apply any deodorants/lotions.  Please wear clean clothes to the hospital/surgery center.   Remember to brush your teeth WITH YOUR REGULAR TOOTHPASTE.   Please read over the following fact sheets that you were given.

## 2018-11-26 NOTE — Anesthesia Preprocedure Evaluation (Addendum)
Anesthesia Evaluation  Patient identified by MRN, date of birth, ID band  Reviewed: Allergy & Precautions, H&P , NPO status , Patient's Chart, lab work & pertinent test results  Airway Mallampati: II  TM Distance: >3 FB Neck ROM: Full    Dental no notable dental hx. (+) Teeth Intact, Dental Advisory Given   Pulmonary neg pulmonary ROS, former smoker,    Pulmonary exam normal breath sounds clear to auscultation       Cardiovascular Exercise Tolerance: Good negative cardio ROS   Rhythm:Regular Rate:Normal     Neuro/Psych Anxiety negative neurological ROS     GI/Hepatic negative GI ROS, Neg liver ROS,   Endo/Other  negative endocrine ROS  Renal/GU negative Renal ROS  negative genitourinary   Musculoskeletal   Abdominal   Peds  Hematology negative hematology ROS (+)   Anesthesia Other Findings   Reproductive/Obstetrics negative OB ROS                            Anesthesia Physical Anesthesia Plan  ASA: II  Anesthesia Plan: General   Post-op Pain Management:    Induction: Intravenous  PONV Risk Score and Plan: 4 or greater and Ondansetron, Dexamethasone and Midazolam  Airway Management Planned: Oral ETT  Additional Equipment:   Intra-op Plan:   Post-operative Plan: Extubation in OR  Informed Consent: I have reviewed the patients History and Physical, chart, labs and discussed the procedure including the risks, benefits and alternatives for the proposed anesthesia with the patient or authorized representative who has indicated his/her understanding and acceptance.     Dental advisory given  Plan Discussed with: CRNA  Anesthesia Plan Comments:         Anesthesia Quick Evaluation

## 2018-11-27 ENCOUNTER — Encounter (HOSPITAL_COMMUNITY)
Admission: RE | Disposition: A | Discharge status: Home / Self Care | Payer: Self-pay | Source: Home / Self Care | Attending: Obstetrics and Gynecology

## 2018-11-27 ENCOUNTER — Encounter (HOSPITAL_COMMUNITY): Payer: Self-pay

## 2018-11-27 ENCOUNTER — Inpatient Hospital Stay (HOSPITAL_COMMUNITY): Payer: BC Managed Care – PPO | Admitting: Certified Registered Nurse Anesthetist

## 2018-11-27 ENCOUNTER — Other Ambulatory Visit: Payer: Self-pay

## 2018-11-27 ENCOUNTER — Inpatient Hospital Stay (HOSPITAL_COMMUNITY)
Admission: RE | Admit: 2018-11-27 | Discharge: 2018-11-28 | DRG: 743 | Disposition: A | Discharge status: Home / Self Care | Payer: BC Managed Care – PPO | Attending: Obstetrics and Gynecology | Admitting: Obstetrics and Gynecology

## 2018-11-27 DIAGNOSIS — Z9049 Acquired absence of other specified parts of digestive tract: Secondary | ICD-10-CM

## 2018-11-27 DIAGNOSIS — Z808 Family history of malignant neoplasm of other organs or systems: Secondary | ICD-10-CM | POA: Diagnosis not present

## 2018-11-27 DIAGNOSIS — Z79899 Other long term (current) drug therapy: Secondary | ICD-10-CM

## 2018-11-27 DIAGNOSIS — R102 Pelvic and perineal pain unspecified side: Secondary | ICD-10-CM | POA: Diagnosis present

## 2018-11-27 DIAGNOSIS — Z9851 Tubal ligation status: Secondary | ICD-10-CM

## 2018-11-27 DIAGNOSIS — F419 Anxiety disorder, unspecified: Secondary | ICD-10-CM | POA: Diagnosis present

## 2018-11-27 DIAGNOSIS — K08409 Partial loss of teeth, unspecified cause, unspecified class: Secondary | ICD-10-CM | POA: Diagnosis present

## 2018-11-27 DIAGNOSIS — N814 Uterovaginal prolapse, unspecified: Principal | ICD-10-CM | POA: Diagnosis present

## 2018-11-27 DIAGNOSIS — Z90710 Acquired absence of both cervix and uterus: Secondary | ICD-10-CM | POA: Diagnosis present

## 2018-11-27 DIAGNOSIS — N800 Endometriosis of uterus: Secondary | ICD-10-CM | POA: Diagnosis present

## 2018-11-27 DIAGNOSIS — N736 Female pelvic peritoneal adhesions (postinfective): Secondary | ICD-10-CM | POA: Diagnosis not present

## 2018-11-27 HISTORY — PX: HYSTERECTOMY ABDOMINAL WITH SALPINGO-OOPHORECTOMY: SHX6792

## 2018-11-27 HISTORY — DX: Pelvic and perineal pain: R10.2

## 2018-11-27 HISTORY — DX: Female pelvic peritoneal adhesions (postinfective): N73.6

## 2018-11-27 LAB — POCT PREGNANCY, URINE: Preg Test, Ur: NEGATIVE

## 2018-11-27 SURGERY — HYSTERECTOMY, ABDOMINAL, WITH SALPINGO-OOPHORECTOMY
Anesthesia: General | Site: Abdomen | Laterality: Bilateral

## 2018-11-27 MED ORDER — LACTATED RINGERS IV SOLN
INTRAVENOUS | Status: DC | PRN
  Administered 2018-11-27 (×2): via INTRAVENOUS

## 2018-11-27 MED ORDER — SODIUM CHLORIDE 0.9 % IV SOLN
2.0000 g | INTRAVENOUS | Status: AC
  Administered 2018-11-27: 2 g via INTRAVENOUS
  Filled 2018-11-27: qty 2

## 2018-11-27 MED ORDER — IBUPROFEN 600 MG PO TABS
600.0000 mg | ORAL_TABLET | Freq: Four times a day (QID) | ORAL | Status: DC | PRN
  Administered 2018-11-27 – 2018-11-28 (×2): 600 mg via ORAL
  Filled 2018-11-27 (×2): qty 1

## 2018-11-27 MED ORDER — BUPIVACAINE HCL (PF) 0.25 % IJ SOLN
INTRAMUSCULAR | Status: DC | PRN
  Administered 2018-11-27: 10 mL

## 2018-11-27 MED ORDER — LIDOCAINE 2% (20 MG/ML) 5 ML SYRINGE
INTRAMUSCULAR | Status: DC | PRN
  Administered 2018-11-27: 60 mg via INTRAVENOUS

## 2018-11-27 MED ORDER — MENTHOL 3 MG MT LOZG: 1.0000 | LOZENGE | OROMUCOSAL | Status: DC | PRN

## 2018-11-27 MED ORDER — LIDOCAINE 2% (20 MG/ML) 5 ML SYRINGE
INTRAMUSCULAR | Status: AC
  Filled 2018-11-27: qty 5

## 2018-11-27 MED ORDER — HYDROMORPHONE HCL 1 MG/ML IJ SOLN
INTRAMUSCULAR | Status: AC
  Filled 2018-11-27: qty 1

## 2018-11-27 MED ORDER — HYDROMORPHONE HCL 1 MG/ML IJ SOLN
0.2500 mg | INTRAMUSCULAR | Status: DC | PRN
  Administered 2018-11-27 (×4): 0.5 mg via INTRAVENOUS

## 2018-11-27 MED ORDER — ROCURONIUM BROMIDE 50 MG/5ML IV SOSY
PREFILLED_SYRINGE | INTRAVENOUS | Status: DC | PRN
  Administered 2018-11-27: 50 mg via INTRAVENOUS

## 2018-11-27 MED ORDER — ONDANSETRON HCL 4 MG PO TABS: 4.0000 mg | ORAL_TABLET | Freq: Four times a day (QID) | ORAL | Status: DC | PRN

## 2018-11-27 MED ORDER — HYDROMORPHONE HCL 1 MG/ML IJ SOLN
INTRAMUSCULAR | Status: AC
  Filled 2018-11-27: qty 0.5

## 2018-11-27 MED ORDER — EPHEDRINE 5 MG/ML INJ
INTRAVENOUS | Status: AC
  Filled 2018-11-27: qty 10

## 2018-11-27 MED ORDER — TRAMADOL HCL 50 MG PO TABS
50.0000 mg | ORAL_TABLET | Freq: Four times a day (QID) | ORAL | Status: DC | PRN
  Administered 2018-11-27 (×2): 50 mg via ORAL
  Filled 2018-11-27 (×2): qty 1

## 2018-11-27 MED ORDER — HYDROCODONE-ACETAMINOPHEN 5-325 MG PO TABS
1.0000 | ORAL_TABLET | ORAL | Status: DC | PRN
  Administered 2018-11-27 – 2018-11-28 (×3): 2 via ORAL
  Filled 2018-11-27 (×3): qty 2

## 2018-11-27 MED ORDER — MIDAZOLAM HCL 5 MG/5ML IJ SOLN
INTRAMUSCULAR | Status: DC | PRN
  Administered 2018-11-27: 1 mg via INTRAVENOUS

## 2018-11-27 MED ORDER — ONDANSETRON HCL 4 MG/2ML IJ SOLN
INTRAMUSCULAR | Status: AC
  Filled 2018-11-27: qty 2

## 2018-11-27 MED ORDER — SIMETHICONE 80 MG PO CHEW: 80.0000 mg | CHEWABLE_TABLET | Freq: Four times a day (QID) | ORAL | Status: DC | PRN

## 2018-11-27 MED ORDER — LACTATED RINGERS IV SOLN: INTRAVENOUS | Status: DC

## 2018-11-27 MED ORDER — KETOROLAC TROMETHAMINE 30 MG/ML IJ SOLN
30.0000 mg | Freq: Once | INTRAMUSCULAR | Status: AC
  Administered 2018-11-27: 09:00:00 30 mg via INTRAVENOUS

## 2018-11-27 MED ORDER — ACETAMINOPHEN 500 MG PO TABS
1000.0000 mg | ORAL_TABLET | Freq: Once | ORAL | Status: AC
  Administered 2018-11-27: 1000 mg via ORAL
  Filled 2018-11-27: qty 2

## 2018-11-27 MED ORDER — ROCURONIUM BROMIDE 10 MG/ML (PF) SYRINGE
PREFILLED_SYRINGE | INTRAVENOUS | Status: AC
  Filled 2018-11-27: qty 10

## 2018-11-27 MED ORDER — KETOROLAC TROMETHAMINE 30 MG/ML IJ SOLN
INTRAMUSCULAR | Status: AC
  Filled 2018-11-27: qty 1

## 2018-11-27 MED ORDER — DEXAMETHASONE SODIUM PHOSPHATE 10 MG/ML IJ SOLN
INTRAMUSCULAR | Status: AC
  Filled 2018-11-27: qty 2

## 2018-11-27 MED ORDER — CLONAZEPAM 0.5 MG PO TABS: 0.5000 mg | ORAL_TABLET | Freq: Two times a day (BID) | ORAL | Status: DC | PRN

## 2018-11-27 MED ORDER — HYDROMORPHONE HCL 1 MG/ML IJ SOLN
1.0000 mg | INTRAMUSCULAR | Status: DC | PRN
  Administered 2018-11-27: 12:00:00 1 mg via INTRAVENOUS
  Filled 2018-11-27: qty 1

## 2018-11-27 MED ORDER — ONDANSETRON HCL 4 MG/2ML IJ SOLN: 4.0000 mg | Freq: Four times a day (QID) | INTRAMUSCULAR | Status: DC | PRN

## 2018-11-27 MED ORDER — SUGAMMADEX SODIUM 200 MG/2ML IV SOLN
INTRAVENOUS | Status: DC | PRN
  Administered 2018-11-27: 200 mg via INTRAVENOUS

## 2018-11-27 MED ORDER — PROPOFOL 10 MG/ML IV BOLUS
INTRAVENOUS | Status: AC
  Filled 2018-11-27: qty 20

## 2018-11-27 MED ORDER — DEXAMETHASONE SODIUM PHOSPHATE 10 MG/ML IJ SOLN
INTRAMUSCULAR | Status: DC | PRN
  Administered 2018-11-27: 10 mg via INTRAVENOUS

## 2018-11-27 MED ORDER — CITALOPRAM HYDROBROMIDE 20 MG PO TABS
20.0000 mg | ORAL_TABLET | Freq: Every day | ORAL | Status: DC
  Administered 2018-11-27: 22:00:00 20 mg via ORAL
  Filled 2018-11-27: qty 1

## 2018-11-27 MED ORDER — PROPOFOL 10 MG/ML IV BOLUS
INTRAVENOUS | Status: DC | PRN
  Administered 2018-11-27: 150 mg via INTRAVENOUS

## 2018-11-27 MED ORDER — LACTATED RINGERS IV SOLN
INTRAVENOUS | Status: DC
  Administered 2018-11-27 – 2018-11-28 (×4): via INTRAVENOUS

## 2018-11-27 MED ORDER — ONDANSETRON HCL 4 MG/2ML IJ SOLN
INTRAMUSCULAR | Status: DC | PRN
  Administered 2018-11-27: 4 mg via INTRAVENOUS

## 2018-11-27 MED ORDER — HYDROMORPHONE HCL 1 MG/ML IJ SOLN
INTRAMUSCULAR | Status: DC | PRN
  Administered 2018-11-27: 0.5 mg via INTRAVENOUS

## 2018-11-27 MED ORDER — FENTANYL CITRATE (PF) 250 MCG/5ML IJ SOLN
INTRAMUSCULAR | Status: AC
  Filled 2018-11-27: qty 5

## 2018-11-27 MED ORDER — MIDAZOLAM HCL 2 MG/2ML IJ SOLN
INTRAMUSCULAR | Status: AC
  Filled 2018-11-27: qty 2

## 2018-11-27 MED ORDER — SUCCINYLCHOLINE CHLORIDE 200 MG/10ML IV SOSY
PREFILLED_SYRINGE | INTRAVENOUS | Status: DC | PRN
  Administered 2018-11-27: 100 mg via INTRAVENOUS

## 2018-11-27 MED ORDER — PHENYLEPHRINE 40 MCG/ML (10ML) SYRINGE FOR IV PUSH (FOR BLOOD PRESSURE SUPPORT)
PREFILLED_SYRINGE | INTRAVENOUS | Status: AC
  Filled 2018-11-27: qty 10

## 2018-11-27 MED ORDER — SUCCINYLCHOLINE CHLORIDE 200 MG/10ML IV SOSY
PREFILLED_SYRINGE | INTRAVENOUS | Status: AC
  Filled 2018-11-27: qty 10

## 2018-11-27 MED ORDER — BUPIVACAINE HCL (PF) 0.25 % IJ SOLN
INTRAMUSCULAR | Status: AC
  Filled 2018-11-27: qty 30

## 2018-11-27 MED ORDER — FENTANYL CITRATE (PF) 250 MCG/5ML IJ SOLN
INTRAMUSCULAR | Status: DC | PRN
  Administered 2018-11-27: 50 ug via INTRAVENOUS
  Administered 2018-11-27: 100 ug via INTRAVENOUS
  Administered 2018-11-27 (×2): 50 ug via INTRAVENOUS

## 2018-11-27 SURGICAL SUPPLY — 39 items
BENZOIN TINCTURE PRP APPL 2/3 (GAUZE/BANDAGES/DRESSINGS) ×1 IMPLANT
CANISTER SUCT 3000ML PPV (MISCELLANEOUS) ×2 IMPLANT
COVER WAND RF STERILE (DRAPES) ×2 IMPLANT
DECANTER SPIKE VIAL GLASS SM (MISCELLANEOUS) ×1 IMPLANT
DERMABOND ADVANCED (GAUZE/BANDAGES/DRESSINGS) ×1
DERMABOND ADVANCED .7 DNX12 (GAUZE/BANDAGES/DRESSINGS) ×1 IMPLANT
DRAPE CESAREAN BIRTH W POUCH (DRAPES) ×2 IMPLANT
DRAPE WARM FLUID 44X44 (DRAPES) IMPLANT
DRSG OPSITE POSTOP 4X10 (GAUZE/BANDAGES/DRESSINGS) ×2 IMPLANT
DURAPREP 26ML APPLICATOR (WOUND CARE) ×2 IMPLANT
GAUZE 4X4 16PLY RFD (DISPOSABLE) ×1 IMPLANT
GLOVE BIO SURGEON STRL SZ 6.5 (GLOVE) ×2 IMPLANT
GLOVE BIOGEL PI IND STRL 7.0 (GLOVE) ×2 IMPLANT
GLOVE BIOGEL PI INDICATOR 7.0 (GLOVE) ×2
GOWN STRL REUS W/ TWL LRG LVL3 (GOWN DISPOSABLE) ×3 IMPLANT
GOWN STRL REUS W/TWL LRG LVL3 (GOWN DISPOSABLE) ×3
HIBICLENS CHG 4% 4OZ BTL (MISCELLANEOUS) ×2 IMPLANT
KIT TURNOVER KIT B (KITS) ×2 IMPLANT
NEEDLE HYPO 22GX1.5 SAFETY (NEEDLE) ×2 IMPLANT
NS IRRIG 1000ML POUR BTL (IV SOLUTION) ×2 IMPLANT
PACK ABDOMINAL GYN (CUSTOM PROCEDURE TRAY) ×2 IMPLANT
PAD ARMBOARD 7.5X6 YLW CONV (MISCELLANEOUS) ×2 IMPLANT
PAD OB MATERNITY 4.3X12.25 (PERSONAL CARE ITEMS) ×2 IMPLANT
SEPRAFILM MEMBRANE 5X6 (MISCELLANEOUS) IMPLANT
SPONGE LAP 18X18 RF (DISPOSABLE) IMPLANT
STAPLER VISISTAT 35W (STAPLE) IMPLANT
STRIP CLOSURE SKIN 1/2X4 (GAUZE/BANDAGES/DRESSINGS) ×1 IMPLANT
SUT PDS AB 0 CT 36 (SUTURE) IMPLANT
SUT PDS AB 0 CTX 60 (SUTURE) IMPLANT
SUT PLAIN 2 0 XLH (SUTURE) IMPLANT
SUT VIC AB 0 CT1 18XCR BRD8 (SUTURE) ×2 IMPLANT
SUT VIC AB 0 CT1 27 (SUTURE) ×4
SUT VIC AB 0 CT1 27XBRD ANBCTR (SUTURE) ×4 IMPLANT
SUT VIC AB 0 CT1 8-18 (SUTURE) ×3
SUT VIC AB 4-0 KS 27 (SUTURE) ×2 IMPLANT
SUT VICRYL 0 TIES 12 18 (SUTURE) ×2 IMPLANT
SYR CONTROL 10ML LL (SYRINGE) ×2 IMPLANT
TOWEL GREEN STERILE FF (TOWEL DISPOSABLE) ×4 IMPLANT
TRAY FOLEY W/BAG SLVR 14FR (SET/KITS/TRAYS/PACK) ×2 IMPLANT

## 2018-11-27 NOTE — Progress Notes (Signed)
Patient doing well. Pain is controlled BP (!) 116/48 (BP Location: Right Arm)   Pulse (!) 48   Temp 97.9 F (36.6 C) (Oral)   Resp 16   SpO2 95%  Urine output good Clear Abdomen is soft and non tender Dressing is dry Surgery reviewed with patient  Routine post op care

## 2018-11-27 NOTE — Plan of Care (Signed)
  Problem: Clinical Measurements: Goal: Will remain free from infection Outcome: Progressing   Problem: Coping: Goal: Level of anxiety will decrease Outcome: Progressing   Problem: Elimination: Goal: Will not experience complications related to urinary retention Outcome: Progressing   Problem: Pain Managment: Goal: General experience of comfort will improve Outcome: Progressing   

## 2018-11-27 NOTE — H&P (Signed)
33 year old female here for TAH, BS.  Patient has some pelvic pain, and some pelvic prolapse. History of ?peritonitis with Laparoscopic cholestectomy and had attempted laparoscopy but was discontinued because of concern for intraabdominal adhesions.  Past Medical History:  Diagnosis Date  . Anxiety   . Chicken pox   . Complication of anesthesia    Prolonged sedation from anesthesia  . Family history of adverse reaction to anesthesia    family has prolonged sedation issues  . HSV infection   . Left ovarian cyst 03/2013   3.8 cm minimally complex left ovarian cyst  . moderate 07/01/2018   right low paraspinal, right upper abdomen  . Obesity   . Pelvic adhesions    Possible  . Pelvic pain   . Shingles 2019  . Strep throat   . Tonsil stone 2019   Past Surgical History:  Procedure Laterality Date  . CHOLECYSTECTOMY  2013  . DILITATION & CURRETTAGE/HYSTROSCOPY WITH HYDROTHERMAL ABLATION N/A 10/31/2018   Procedure: DILATATION & CURETTAGE/HYSTEROSCOPY WITH attempted HYDROTHERMAL ABLATION;  Surgeon: Dian Queen, MD;  Location: [facility];  Service: Gynecology;  Laterality: N/A;  . LAPAROSCOPIC TUBAL LIGATION Bilateral 10/31/2018   Procedure: attemted LAPAROSCOPIC TUBAL LIGATION;  Surgeon: Dian Queen, MD;  Location: [facility];  Service: Gynecology;  Laterality: Bilateral;  . WISDOM TOOTH EXTRACTION    . WOUND EXPLORATION Left 09/12/2017   Procedure: LEFT HAND EXPLORATION;  Surgeon: Leanora Cover, MD;  Location: [place];  Service: Orthopedics;  Laterality: Left;   Prior to Admission medications   Medication Sig Start Date End Date Taking? Authorizing Provider  citalopram (CELEXA) 20 MG tablet Take 20 mg by mouth at bedtime.    Yes [provider]  clonazePAM (KLONOPIN) 0.5 MG tablet Take 0.5 mg by mouth 2 (two) times daily as needed for anxiety.   Yes [provider]  valACYclovir (VALTREX) 1000 MG tablet Take 0.5  tablets (500 mg total) by mouth daily. Patient taking differently: Take 500 mg by mouth at bedtime.  02/07/18  Yes Campbell Riches, MD  medroxyPROGESTERone (DEPO-PROVERA) 150 MG/ML injection Inject 150 mg into the muscle every 3 (three) months.    [provider]  neomycin-polymyxin-hydrocortisone (CORTISPORIN) OTIC solution Place 3 drops into both ears 4 (four) times daily. Patient not taking: Reported on 11/17/2018 10/17/18   Mackie Pai, PA-C   Patient has no known allergies.  Family History  Problem Relation Age of Onset  . Hypertension Mother   . Arthritis Maternal Grandmother   . Hyperlipidemia Maternal Grandmother   . Hypertension Maternal Grandmother   . Throat cancer Maternal Grandfather   . Stroke Maternal Aunt   . Healthy Sister        x1  . Healthy Daughter        x1   Social History   Socioeconomic History  . Marital status: Single    Spouse name: Not on file  . Number of children: Not on file  . Years of education: Not on file  . Highest education level: Not on file  Occupational History  . Not on file  Social Needs  . Financial resource strain: Not on file  . Food insecurity    Worry: Not on file    Inability: Not on file  . Transportation needs    Medical: Not on file    Non-medical: Not on file  Tobacco Use  . Smoking status: Former Smoker    Quit date: 07/27/2012  Years since quitting: 6.3  . Smokeless tobacco: Never Used  Substance and Sexual Activity  . Alcohol use: Yes    Alcohol/week: 1.0 standard drinks    Types: 1 Standard drinks or equivalent per week    Comment: rarely  . Drug use: No  . Sexual activity: Yes    Birth control/protection: None    Comment: men  Lifestyle  . Physical activity    Days per week: Not on file    Minutes per session: Not on file  . Stress: Not on file  Relationships  . Social Herbalist on phone: Not on file    Gets together: Not on file    Attends religious service: Not on file     Active member of club or organization: Not on file    Attends meetings of clubs or organizations: Not on file    Relationship status: Not on file  Other Topics Concern  . Not on file  Social History Narrative  . Not on file   BP (!) 124/52   Pulse (!) 55   Temp 98.1 F (36.7 C) (Oral)   Resp 17   SpO2 99%  General alert and oriented Lung CTAB Car RRR Abdomen soft and non tender Pelvic Grade 2 cervical uterine prolapse  Results for orders placed or performed during the hospital encounter of 11/27/18 (from the past 24 hour(s))  Pregnancy, urine POC     Status: None   Collection Time: 11/27/18  6:48 AM  Result Value Ref Range   Preg Test, Ur NEGATIVE NEGATIVE   Impression: Pelvic pain ?pelvic adhesions Pelvic prolapse  PLAN: TAH BS Risks reviewed  Consent signed

## 2018-11-27 NOTE — Brief Op Note (Signed)
11/27/2018  8:46 AM  PATIENT:  Frederik Pear Bostic  33 y.o. female  PRE-OPERATIVE DIAGNOSIS: Uterine prolapse Pelvic pain  POST-OPERATIVE DIAGNOSIS:   Same Adhesions Adenomyosis PROCEDURE:  Procedure(s): HYSTERECTOMY ABDOMINAL WITH BILATERAL SALPINGECTOMY (Bilateral)  SURGEON:  Surgeon(s) and Role:    * Dian Queen, MD - Primary  PHYSICIAN ASSISTANT:   ASSISTANTS: Dr Marylynn Pearson   ANESTHESIA:   general  EBL:  200 mL   BLOOD ADMINISTERED:none  DRAINS Foley  LOCAL MEDICATIONS USED:  LIDOCAINE   SPECIMEN:  Source of Specimen:  cervix uterus and tubes  DISPOSITION OF SPECIMEN:  PATHOLOGY  COUNTS:  YES  TOURNIQUET:  * No tourniquets in log *  DICTATION: .Other Dictation: Dictation Number dictated  PLAN OF CARE: Admit to inpatient   PATIENT DISPOSITION:  PACU - hemodynamically stable.   Delay start of Pharmacological VTE agent (>24hrs) due to surgical blood loss or risk of bleeding: not applicable

## 2018-11-27 NOTE — Anesthesia Procedure Notes (Signed)
Procedure Name: Intubation Date/Time: 11/27/2018 7:30 AM Performed by: Shirlyn Goltz, CRNA Pre-anesthesia Checklist: Patient identified, Emergency Drugs available, Suction available and Patient being monitored Patient Re-evaluated:Patient Re-evaluated prior to induction Oxygen Delivery Method: Circle system utilized Preoxygenation: Pre-oxygenation with 100% oxygen Induction Type: IV induction Laryngoscope Size: Mac and 3 Grade View: Grade I Tube type: Oral Tube size: 7.0 mm Number of attempts: 1 Airway Equipment and Method: Stylet Placement Confirmation: ETT inserted through vocal cords under direct vision,  positive ETCO2 and breath sounds checked- equal and bilateral Secured at: 23 cm Tube secured with: Tape Dental Injury: Teeth and Oropharynx as per pre-operative assessment

## 2018-11-27 NOTE — Anesthesia Postprocedure Evaluation (Signed)
Anesthesia Post Note  Patient: Tempest N Bostic  Procedure(s) Performed: HYSTERECTOMY ABDOMINAL WITH BILATERAL SALPINGECTOMY (Bilateral Abdomen)     Patient location during evaluation: PACU Anesthesia Type: General Level of consciousness: awake and alert Pain management: pain level controlled Vital Signs Assessment: post-procedure vital signs reviewed and stable Respiratory status: spontaneous breathing, nonlabored ventilation and respiratory function stable Cardiovascular status: blood pressure returned to baseline and stable Postop Assessment: no apparent nausea or vomiting Anesthetic complications: no    Last Vitals:  Vitals:   11/27/18 0937 11/27/18 0952  BP: (!) 109/57 (!) 115/46  Pulse: 60 (!) 56  Resp: 15 17  Temp:  36.4 C  SpO2: 94% 94%    Last Pain:  Vitals:   11/27/18 0945  TempSrc:   PainSc: 3                  Peggi Yono,W. EDMOND

## 2018-11-27 NOTE — Transfer of Care (Signed)
Immediate Anesthesia Transfer of Care Note  Patient: Olivia Reyes  Procedure(s) Performed: HYSTERECTOMY ABDOMINAL WITH BILATERAL SALPINGECTOMY (Bilateral Abdomen)  Patient Location: PACU  Anesthesia Type:General  Level of Consciousness: awake, alert , oriented and patient cooperative  Airway & Oxygen Therapy: Patient Spontanous Breathing and Patient connected to nasal cannula oxygen  Post-op Assessment: Report given to RN and Post -op Vital signs reviewed and stable  Post vital signs: Reviewed and stable  Last Vitals:  Vitals Value Taken Time  BP 90/46 11/27/18 0853  Temp 36.3 C 11/27/18 0853  Pulse 87 11/27/18 0854  Resp 16 11/27/18 0854  SpO2 87 % 11/27/18 0854  Vitals shown include unvalidated device data.  Last Pain:  Vitals:   11/27/18 0640  TempSrc:   PainSc: 0-No pain      Patients Stated Pain Goal: 4 (53/96/72 8979)  Complications: No apparent anesthesia complications

## 2018-11-27 NOTE — Progress Notes (Signed)
Patient admitted to unit from PACU, VSS. Patient settled in bed and oriented to unit and hospital routine. Patient lower abdomen dressing in place with scant amount of blood present, peri pad clean, dry and in place. Patient has foley catheter in place and patent. Pt resting comfortably in bed with call bell in reach and side rails up. Instructed patient to utilize call bell for assistance. Will continue to monitor closely for remainder of shift.

## 2018-11-28 ENCOUNTER — Encounter (HOSPITAL_COMMUNITY): Payer: Self-pay | Admitting: Obstetrics and Gynecology

## 2018-11-28 LAB — CBC
HCT: 34 % — ABNORMAL LOW (ref 36.0–46.0)
Hemoglobin: 11.3 g/dL — ABNORMAL LOW (ref 12.0–15.0)
MCH: 30 pg (ref 26.0–34.0)
MCHC: 33.2 g/dL (ref 30.0–36.0)
MCV: 90.2 fL (ref 80.0–100.0)
Platelets: 210 10*3/uL (ref 150–400)
RBC: 3.77 MIL/uL — ABNORMAL LOW (ref 3.87–5.11)
RDW: 13.1 % (ref 11.5–15.5)
WBC: 10.5 10*3/uL (ref 4.0–10.5)
nRBC: 0 % (ref 0.0–0.2)

## 2018-11-28 LAB — BASIC METABOLIC PANEL
Anion gap: 9 (ref 5–15)
BUN: 10 mg/dL (ref 6–20)
CO2: 22 mmol/L (ref 22–32)
Calcium: 9.3 mg/dL (ref 8.9–10.3)
Chloride: 107 mmol/L (ref 98–111)
Creatinine, Ser: 0.89 mg/dL (ref 0.44–1.00)
GFR calc Af Amer: >60 mL/min (ref >60)
GFR calc non Af Amer: >60 mL/min (ref >60)
Glucose, Bld: 193 mg/dL — ABNORMAL HIGH (ref 70–99)
Potassium: 4.4 mmol/L (ref 3.5–5.1)
Sodium: 138 mmol/L (ref 135–145)

## 2018-11-28 MED ORDER — IBUPROFEN 600 MG PO TABS: 600.0000 mg | ORAL_TABLET | Freq: Four times a day (QID) | ORAL | 0 refills | Status: DC | PRN

## 2018-11-28 MED ORDER — HYDROCODONE-ACETAMINOPHEN 5-325 MG PO TABS: 1.0000 | ORAL_TABLET | ORAL | 0 refills | Status: DC | PRN

## 2018-11-28 NOTE — Discharge Summary (Signed)
Admission Diagnosis: Pelvic Pain Possible adhesions  Discharge Diagnosis: Same Probable Uterine adenomyosis  Hospital Course:  33 year old female admitted for TAH and BS. She did very well post op . By POD # 1 she was ambulating, voiding and tolerating regular diet.  BP (!) 120/47 (BP Location: Right Arm)   Pulse (!) 50   Temp 98.7 F (37.1 C) (Oral)   Resp 18   Ht 5\' 11"  (1.803 m)   Wt 103.1 kg   SpO2 97%   BMI 31.70 kg/m  Results for orders placed or performed during the hospital encounter of 11/27/18 (from the past 24 hour(s))  Basic metabolic panel     Status: Abnormal   Collection Time: 11/28/18  3:52 AM  Result Value Ref Range   Sodium 138 135 - 145 mmol/L   Potassium 4.4 3.5 - 5.1 mmol/L   Chloride 107 98 - 111 mmol/L   CO2 22 22 - 32 mmol/L   Glucose, Bld 193 (H) 70 - 99 mg/dL   BUN 10 6 - 20 mg/dL   Creatinine, Ser 0.89 0.44 - 1.00 mg/dL   Calcium 9.3 8.9 - 10.3 mg/dL   GFR calc non Af Amer >60 >60 mL/min   GFR calc Af Amer >60 >60 mL/min   Anion gap 9 5 - 15  CBC     Status: Abnormal   Collection Time: 11/28/18  3:52 AM  Result Value Ref Range   WBC 10.5 4.0 - 10.5 K/uL   RBC 3.77 (L) 3.87 - 5.11 MIL/uL   Hemoglobin 11.3 (L) 12.0 - 15.0 g/dL   HCT 34.0 (L) 36.0 - 46.0 %   MCV 90.2 80.0 - 100.0 fL   MCH 30.0 26.0 - 34.0 pg   MCHC 33.2 30.0 - 36.0 g/dL   RDW 13.1 11.5 - 15.5 %   Platelets 210 150 - 400 K/uL   nRBC 0.0 0.0 - 0.2 %   Abdomen soft and non tender and benign  Discharged home in good condition rx ibuprofen and percocet  Folow up in 1 week

## 2018-12-02 NOTE — Op Note (Signed)
Olivia, Reyes MEDICAL RECORD SE:[id number] ACCOUNT [id number] DATE OF BIRTH:[birth year] FACILITY: [facility] LOCATION: [facility] PHYSICIAN:Yardley Beltran Lynett Fish, MD  OPERATIVE REPORT  DATE OF PROCEDURE:  11/27/2018  PREOPERATIVE DIAGNOSES:  Pelvic pain, possible adhesions.  POSTOPERATIVE DIAGNOSES:  Pelvic pain, pelvic adhesions, adenomyosis probable.  PROCEDURE:  Total abdominal hysterectomy with bilateral salpingectomy.  SURGEON:  Dian Queen, MD  ASSISTANT:  Marylynn Pearson, MD  ESTIMATED BLOOD LOSS:  200 mL.  COMPLICATIONS:  None.  DRAINS:  Foley.  PATHOLOGY:  Uterus, cervix, and fallopian tubes sent to pathology.  DESCRIPTION OF PROCEDURE:  The patient was taken to the operating room.  She was prepped and draped after intubation.  Timeout was performed.  Foley catheter had been inserted.  A low transverse incision was made and carried down to the fascia.  The  fascia was scored in the midline laterally.  We then divided the rectus muscles and carefully entered the peritoneum.  There were some adhesions, probably from previous laparoscopic cholecystectomy.  There was a clump of omentum just around the  umbilicus, and I feel that might have been why attempted laparoscopy was unsuccessful with the previous procedure.  We then placed a self-retaining retractor in the abdominal cavity, the large and small bowel, and upper abdomen.  Her uterus was an  abnormal Y-shaped uterus, maybe a bicornuate uterus, and definite probable adenomyosis.  Adnexa were unremarkable.  We proceeded with a hysterectomy by identifying the round ligaments and then ligating each round ligament, transecting each one,  developing the bladder flap, placing curved Heaney clamps across the mesosalpinx.  Each pedicle was clamped, cut, and suture ligated using 0 Vicryl suture.  We then skeletonized the uterine artery and then clamped the uterine artery at the level of the  internal os using curved Heaney clamps.   Each pedicle was clamped, cut, and suture ligated using 0 Vicryl suture.  We then carefully walked our way down the cervix by clamping the uterosacral cardinal ligaments on either side.  Each pedicle was clamped,  cut, and suture ligated using 0 Vicryl suture.  We reached a level of external os, and the specimen was removed and sent to pathology.  Each angle stitch was placed using 0 Vicryl suture.  The remainder of the cuff was closed using 0 Vicryl suture.  The  pelvis was irrigated.  Hemostasis was very good.  All instruments and laparotomy pads were removed.  We then closed the peritoneum in standard fashion, closed the fascia in a running stitch, closed the subcu using plain interrupteds, and closed the skin  using 3-0 Vicryl in a running stitch.  Steri-Strips, benzoin, and a honeycomb dressing were placed.  All sponge, lap and instrument counts were correct x2.  The patient went to recovery room in stable condition.     LN/NUANCE  D:12/02/2018 T:12/02/2018 JOB:[id number]

## 2018-12-04 NOTE — Telephone Encounter (Signed)
Can increase her valtrex to 1 gram daily.  She may need to be seen by Derm if this is not responsive.  thanks

## 2019-01-05 ENCOUNTER — Encounter: Payer: Self-pay | Admitting: Medical

## 2019-01-05 ENCOUNTER — Other Ambulatory Visit: Payer: Self-pay

## 2019-01-05 ENCOUNTER — Ambulatory Visit: Payer: Self-pay | Admitting: Family Medicine

## 2019-01-05 ENCOUNTER — Ambulatory Visit (INDEPENDENT_AMBULATORY_CARE_PROVIDER_SITE_OTHER): Payer: BC Managed Care – PPO | Admitting: Medical

## 2019-01-05 DIAGNOSIS — K6289 Other specified diseases of anus and rectum: Secondary | ICD-10-CM

## 2019-01-05 DIAGNOSIS — K921 Melena: Secondary | ICD-10-CM | POA: Diagnosis not present

## 2019-01-05 NOTE — Telephone Encounter (Signed)
Pt called in c/o having bright red blood on the toilet paper and a little in her stool when having a BM.    She also has bad rectal pain when having a BM but not otherwise.  See triage notes.  I warm transferred the call into Dr. Janett Billow Copland's office.   I let them know what was going on with her and Elnita Maxwell was going to get her scheduled.    I sent my notes to the office.  Reason for Disposition . [1] Rectal bleeding is minimal (e.g., blood just on toilet paper, few drops, streaks on surface of normal formed BM) AND [2] bleeding recurs 3 or more times on treatment  Answer Assessment - Initial Assessment Questions 1. APPEARANCE of BLOOD: "What color is it?" "Is it passed separately, on the surface of the stool, or mixed in with the stool?"      *No Answer*Bright red blood on toilet paper and on the stool.    2. AMOUNT: "How much blood was passed?"      I had gallbladder removed so I either have constipation or diarrhea.   I had hysterectomy on July 2.    I'm not on opioids now. 3. FREQUENCY: "How many times has blood been passed with the stools?"      Been bleeding for 2 wks but last 2-3 days every time I have a BM it's on the toilet paper and in my stool bright red blood. 4. ONSET: "When was the blood first seen in the stools?" (Days or weeks)      2 wks ago.     When I'm having a BM it hurts so bad.   It feels like I'm rearing open.    No pain when I'm not having a BM.    5. DIARRHEA: "Is there also some diarrhea?" If so, ask: "How many diarrhea stools were passed in past 24 hours?"      Yes 6. CONSTIPATION: "Do you have constipation?" If so, "How bad is it?"     Yes 7. RECURRENT SYMPTOMS: "Have you had blood in your stools before?" If so, ask: "When was the last time?" and "What happened that time?"      No 8. BLOOD THINNERS: "Do you take any blood thinners?" (e.g., Coumadin/warfarin, Pradaxa/dabigatran, aspirin)     No 9. OTHER SYMPTOMS: "Do you have any other symptoms?"  (e.g.,  abdominal pain, vomiting, dizziness, fever)     Rectal pain when having a BM.   I have dizzy spells but Dr. Lorelei Pont is aware of it.    10. PREGNANCY: "Is there any chance you are pregnant?" "When was your last menstrual period?"       Hysterectomy  Protocols used: RECTAL BLEEDING-A-AH

## 2019-01-05 NOTE — Patient Instructions (Addendum)
Your rectal pain may be rectal fissure vs internal hemorrhoid. I do think it would be best for you to see gi to have physical exam. For starter anoscopy would be beneficial to  Help determine definitive diagnosis. Other studies may be needed. Would recommend stay well hydrated, use metamucil, 1 rounded tablespoon in 8 oz water 3 times daily, and use probiotic otc.  In addition some pattern of ibs in past. Stay with above regimen as well. Will get gi opinion as stated above.  Follow up in date to be determined after GI evaluation.  Note for pt current discomfort explained she could use tylenol or low dose ibuprofen. But if she should not use iburofen get epigastric discomfort or any dark or  black stools. Discussed reasoning pt. She expressed understanding.

## 2019-01-05 NOTE — Progress Notes (Signed)
Subjective:    Patient ID: Olivia Reyes, female    DOB: 1986/03/04, 33 y.o.   MRN: [id number]  HPI Virtual Visit via Video Note  I connected with Olivia Reyes on 01/05/19 at  2:40 PM EDT by a video enabled telemedicine application and verified that I am speaking with the correct person using two identifiers.  Location: Patient:car Provider: home  Pt did not check vital today.  Pt last bp was good. Reading was good per her report. Just can't remember   I discussed the limitations of evaluation and management by telemedicine and the availability of in person appointments. The patient expressed understanding and agreed to proceed.    History of Present Illness: Pt states she has little bit of rectal bleeding over past 2 weeks. Initially was just on toilet paper up just recently when initially saw some blood in toilet. She states bright red blood. Pt states no itching from rectum. Does have some constipation but also some rectal pain. Very painful with just passing bm. Worse when passes constipated type stool.   Pt explains 50% if time constipated by about 50% loose stools. Pt had gallbaldder removed in the past.  Pt tries to avoid straining.  Pt in past states no diagnosis of   Today feels fine. Last bm yesterday and had rectal pain yesterday.  More pain with  with constipation.   Observations/Objective:  General- pleasant, oriented. Normal speech.   Assessment and Plan: Your rectal pain may be rectal fissure vs internal hemorrhoid. I do think it would be best for you to see gi to have physical exam. For starter anoscopy would be beneficial to  Help determine definitive diagnosis. Other studies may be needed. Would recommend stay well hydrated, use metamucil, 1 rounded tablespoon in 8 oz water 3 times daily, and use probiotic otc.  In addition some pattern of ibs in past. Stay with above regimen as well. Will get gi opinion as stated above.  Follow up in date to be  determined after GI evaluation.  Follow Up Instructions: Your rectal pain may be rectal fissure vs internal hemorrhoid. I do think it would be best for you to see gi to have physical exam. For starter anoscopy would be beneficial to  Help determine definitive diagnosis. Other studies may be needed. Would recommend stay well hydrated, use metamucil, 1 rounded tablespoon in 8 oz water 3 times daily, and use probiotic otc.  In addition some pattern of ibs in past. Stay with above regimen as well. Will get gi opinion as stated above.  Follow up in date to be determined after GI evaluation.  Note for pt current discomfort explained she could use tylenol or low dose ibuprofen. But if she should not use iburofen get epigastric discomfort or any dark or  black stools. Discussed reasoning pt. She expressed understanding.   I discussed the assessment and treatment plan with the patient. The patient was provided an opportunity to ask questions and all were answered. The patient agreed with the plan and demonstrated an understanding of the instructions.   The patient was advised to call back or seek an in-person evaluation if the symptoms worsen or if the condition fails to improve as anticipated.  I provided 25  minutes of non-face-to-face time during this encounter.   Mackie Pai, PA-C    Review of Systems  Constitutional: Negative for chills, fatigue and fever.  Respiratory: Negative for cough, chest tightness, shortness of breath and wheezing.   Cardiovascular:  Negative for chest pain and palpitations.  Gastrointestinal: Positive for blood in stool and rectal pain. Negative for abdominal distention, anal bleeding, constipation, diarrhea, nausea and vomiting.  Musculoskeletal: Negative for back pain and joint swelling.  Skin: Negative for rash.  Neurological: Negative for dizziness, speech difficulty, weakness, numbness and headaches.  Hematological: Negative for adenopathy. Does not  bruise/bleed easily.  Psychiatric/Behavioral: Negative for behavioral problems, dysphoric mood and hallucinations. The patient is not nervous/anxious.     Past Medical History:  Diagnosis Date  . Anxiety   . Chicken pox   . Complication of anesthesia    Prolonged sedation from anesthesia  . Family history of adverse reaction to anesthesia    family has prolonged sedation issues  . HSV infection   . Left ovarian cyst 03/2013   3.8 cm minimally complex left ovarian cyst  . moderate 07/01/2018   right low paraspinal, right upper abdomen  . Obesity   . Pelvic adhesions    Possible  . Pelvic pain   . Shingles 2019  . Strep throat   . Tonsil stone 2019     Social History   Socioeconomic History  . Marital status: Single    Spouse name: Not on file  . Number of children: Not on file  . Years of education: Not on file  . Highest education level: Not on file  Occupational History  . Not on file  Social Needs  . Financial resource strain: Not on file  . Food insecurity    Worry: Not on file    Inability: Not on file  . Transportation needs    Medical: Not on file    Non-medical: Not on file  Tobacco Use  . Smoking status: Former Smoker    Quit date: 07/27/2012    Years since quitting: 6.4  . Smokeless tobacco: Never Used  Substance and Sexual Activity  . Alcohol use: Yes    Alcohol/week: 1.0 standard drinks    Types: 1 Standard drinks or equivalent per week    Comment: rarely  . Drug use: No  . Sexual activity: Yes    Birth control/protection: None    Comment: men  Lifestyle  . Physical activity    Days per week: Not on file    Minutes per session: Not on file  . Stress: Not on file  Relationships  . Social Herbalist on phone: Not on file    Gets together: Not on file    Attends religious service: Not on file    Active member of club or organization: Not on file    Attends meetings of clubs or organizations: Not on file    Relationship status: Not  on file  . Intimate partner violence    Fear of current or ex partner: Not on file    Emotionally abused: Not on file    Physically abused: Not on file    Forced sexual activity: Not on file  Other Topics Concern  . Not on file  Social History Narrative  . Not on file    Past Surgical History:  Procedure Laterality Date  . CHOLECYSTECTOMY  2013  . DILITATION & CURRETTAGE/HYSTROSCOPY WITH HYDROTHERMAL ABLATION N/A 10/31/2018   Procedure: DILATATION & CURETTAGE/HYSTEROSCOPY WITH attempted HYDROTHERMAL ABLATION;  Surgeon: Dian Queen, MD;  Location: [facility];  Service: Gynecology;  Laterality: N/A;  . HYSTERECTOMY ABDOMINAL WITH SALPINGO-OOPHORECTOMY Bilateral 11/27/2018   Procedure: HYSTERECTOMY ABDOMINAL WITH BILATERAL SALPINGECTOMY;  Surgeon: Dian Queen, MD;  [facility];  Service: Gynecology;  Laterality: Bilateral;  . LAPAROSCOPIC TUBAL LIGATION Bilateral 10/31/2018   Procedure: attemted LAPAROSCOPIC TUBAL LIGATION;  Surgeon: Dian Queen, MD;  Location: [facility];  Service: Gynecology;  Laterality: Bilateral;  . WISDOM TOOTH EXTRACTION    . WOUND EXPLORATION Left 09/12/2017   Procedure: LEFT HAND EXPLORATION;  Surgeon: Leanora Cover, MD;  Location: [facility];  Service: Orthopedics;  Laterality: Left;    Family History  Problem Relation Age of Onset  . Hypertension Mother   . Arthritis Maternal Grandmother   . Hyperlipidemia Maternal Grandmother   . Hypertension Maternal Grandmother   . Throat cancer Maternal Grandfather   . Stroke Maternal Aunt   . Healthy Sister        x1  . Healthy Daughter        x1    No Known Allergies  Current Outpatient Medications on File Prior to Visit  Medication Sig Dispense Refill  . citalopram (CELEXA) 20 MG tablet Take 20 mg by mouth at bedtime.     . clonazePAM (KLONOPIN) 0.5 MG tablet Take 0.5 mg by mouth 2 (two) times daily as needed for anxiety.    Marland Kitchen  HYDROcodone-acetaminophen (NORCO/VICODIN) 5-325 MG tablet Take 1-2 tablets by mouth every 4 (four) hours as needed for moderate pain. 30 tablet 0  . ibuprofen (ADVIL) 600 MG tablet Take 1 tablet (600 mg total) by mouth every 6 (six) hours as needed for mild pain or moderate pain. 30 tablet 0  . medroxyPROGESTERone (DEPO-PROVERA) 150 MG/ML injection Inject 150 mg into the muscle every 3 (three) months.    . neomycin-polymyxin-hydrocortisone (CORTISPORIN) OTIC solution Place 3 drops into both ears 4 (four) times daily. (Patient not taking: Reported on 11/17/2018) 10 mL 0  . valACYclovir (VALTREX) 1000 MG tablet Take 0.5 tablets (500 mg total) by mouth daily. (Patient taking differently: Take 500 mg by mouth at bedtime. ) 90 tablet 3   No current facility-administered medications on file prior to visit.     There were no vitals taken for this visit.      Objective:   Physical Exam        Assessment & Plan:

## 2019-01-05 NOTE — Telephone Encounter (Signed)
Appt scheduled

## 2019-01-06 ENCOUNTER — Encounter: Payer: Self-pay | Admitting: Gastroenterology

## 2019-01-13 DIAGNOSIS — D229 Melanocytic nevi, unspecified: Secondary | ICD-10-CM | POA: Diagnosis not present

## 2019-01-13 DIAGNOSIS — L409 Psoriasis, unspecified: Secondary | ICD-10-CM | POA: Diagnosis not present

## 2019-01-23 ENCOUNTER — Other Ambulatory Visit: Payer: Self-pay

## 2019-01-23 ENCOUNTER — Encounter: Payer: Self-pay | Admitting: Gastroenterology

## 2019-01-23 ENCOUNTER — Ambulatory Visit: Payer: BC Managed Care – PPO | Admitting: Gastroenterology

## 2019-01-23 VITALS — BP 108/62 | HR 59 | Temp 98.7°F | Ht 71.0 in | Wt 231.0 lb

## 2019-01-23 DIAGNOSIS — K602 Anal fissure, unspecified: Secondary | ICD-10-CM | POA: Diagnosis not present

## 2019-01-23 DIAGNOSIS — K921 Melena: Secondary | ICD-10-CM

## 2019-01-23 DIAGNOSIS — K5900 Constipation, unspecified: Secondary | ICD-10-CM

## 2019-01-23 MED ORDER — AMBULATORY NON FORMULARY MEDICATION: 1 refills | Status: DC

## 2019-01-23 NOTE — Progress Notes (Signed)
Chief Complaint: Hematochezia, dyschezia  Referring Provider:     Mackie Pai, PA-C   HPI:    Olivia Reyes is a 33 y.o. female referred to the [facility] for evaluation of hematochezia.  States that symptoms started after surgery (hysterectomy w b/l salpingectomy) on 11/27/2018 with associated postoperative constipation.  Postop hemoglobin 11.3 (baseline 13.5), transfused 1U PRBC.    No sxs prior to surgery. Constipation after surgery- trialed prune juice, apple juice, Miralax with some BMs. Developed scant BRB on tissue paper 6 weeks post op, then developed BRBPR in toilet water/on stool. Stopped Vicodin 2 weeks after surgery. Now with dyschezia with every BM- "passing glass" with every BM regardless of loose or hard stools. No prior similar sxs.   Was seen as a telehealth appointment by [facility] on 8/10 for this issue.  Recommended in office evaluation for anoscopy, hydration, Metamucil, probiotic, GI referral.  ccy 2013, and since then has had alternating diarrhea/constipation. Never hematochezia. Tried probiotics without change.   No previous colonoscopy.  No recent abdominal imaging for review.  No known family history of CRC, GI malignancy, liver disease, pancreatic disease, or IBD.    Past Medical History:  Diagnosis Date  . Anxiety   . Chicken pox   . Complication of anesthesia    Prolonged sedation from anesthesia  . Family history of adverse reaction to anesthesia    family has prolonged sedation issues  . HSV infection   . Left ovarian cyst 03/2013   3.8 cm minimally complex left ovarian cyst  . moderate 07/01/2018   right low paraspinal, right upper abdomen  . Obesity   . Pelvic adhesions    Possible  . Pelvic pain   . Shingles 2019  . Strep throat   . Tonsil stone 2019     Past Surgical History:  Procedure Laterality Date  . CHOLECYSTECTOMY  2013  . DILITATION & CURRETTAGE/HYSTROSCOPY WITH HYDROTHERMAL ABLATION N/A 10/31/2018   Procedure: DILATATION & CURETTAGE/HYSTEROSCOPY WITH attempted HYDROTHERMAL ABLATION;  Surgeon: Dian Queen, MD;  Location: [facility];  Service: Gynecology;  Laterality: N/A;  . HYSTERECTOMY ABDOMINAL WITH SALPINGO-OOPHORECTOMY Bilateral 11/27/2018   Procedure: HYSTERECTOMY ABDOMINAL WITH BILATERAL SALPINGECTOMY;  Surgeon: Dian Queen, MD;  Location: [facility];  Service: Gynecology;  Laterality: Bilateral;  . LAPAROSCOPIC TUBAL LIGATION Bilateral 10/31/2018   Procedure: attemted LAPAROSCOPIC TUBAL LIGATION;  Surgeon: Dian Queen, MD;  Location: [facility];  Service: Gynecology;  Laterality: Bilateral;  . WISDOM TOOTH EXTRACTION    . WOUND EXPLORATION Left 09/12/2017   Procedure: LEFT HAND EXPLORATION;  Surgeon: Leanora Cover, MD;  Location: [facility];  Service: Orthopedics;  Laterality: Left;   Family History  Problem Relation Age of Onset  . Hypertension Mother   . Arthritis Maternal Grandmother   . Hyperlipidemia Maternal Grandmother   . Hypertension Maternal Grandmother   . Throat cancer Maternal Grandfather   . Stroke Maternal Aunt   . Healthy Sister        x1  . Healthy Daughter        x1   Social History   Tobacco Use  . Smoking status: Former Smoker    Quit date: 07/27/2012    Years since quitting: 6.4  . Smokeless tobacco: Never Used  Substance Use Topics  . Alcohol use: Yes    Alcohol/week: 1.0 standard drinks    Types: 1 Standard drinks or equivalent per week  Comment: rarely  . Drug use: No   Current Outpatient Medications  Medication Sig Dispense Refill  . citalopram (CELEXA) 20 MG tablet Take 20 mg by mouth at bedtime.     . clonazePAM (KLONOPIN) 0.5 MG tablet Take 0.5 mg by mouth 2 (two) times daily as needed for anxiety.    . valACYclovir (VALTREX) 1000 MG tablet Take 0.5 tablets (500 mg total) by mouth daily. (Patient taking differently: Take 500 mg by mouth at bedtime. ) 90 tablet 3   No current  facility-administered medications for this visit.    No Known Allergies   Review of Systems: All systems reviewed and negative except where noted in HPI.     Physical Exam:    Wt Readings from Last 3 Encounters:  01/23/19 231 lb (104.8 kg)  11/27/18 227 lb 4.7 oz (103.1 kg)  11/24/18 227 lb 6 oz (103.1 kg)    BP 108/62   Pulse (!) 59   Temp 98.7 F (37.1 C)   Ht 5\' 11"  (1.803 m)   Wt 231 lb (104.8 kg)   BMI 32.22 kg/m  Constitutional:  Pleasant, in no acute distress. Psychiatric: Normal mood and affect. Behavior is normal. EENT: Pupils normal.  Conjunctivae are normal. No scleral icterus. Neck supple. No cervical LAD. Cardiovascular: Normal rate, regular rhythm. No edema Pulmonary/chest: Effort normal and breath sounds normal. No wheezing, rales or rhonchi. Abdominal: Soft, nondistended, nontender. Bowel sounds active throughout. There are no masses palpable. No hepatomegaly. Neurological: Alert and oriented to person place and time. Skin: Skin is warm and dry. No rashes noted. Rectal exam: Sensation intact and preserved anal wink. No external anal fissures, external hemorrhoids or skin tags. Normal sphincter tone. No palpable mass.  Internal anal fissure in left lateral position.  No blood on the exam glove. (Chaperone: Grace Bushy, LPN)      ASSESSMENT AND PLAN;   1) Anal Fissure: 2) Hematochezia 3) Constipation Physical exam notable for palpable, tender, internal anal fissure in the left lateral position. Will treat as below:  - Start topical NTG 0.125%, apply a small, pea-sized amount to the affected area BID for 6-8 weeks.  Use Q-tip to apply - We discussed the ADR of headache, and if patient does experience, to call me and will change and make pharmacy request to compound topical CCB (topical nifedipine 0.2-0.3% applied 2-4 times daily; unfortunately, the CCB also carries an ADR of headache in 5-12%). Additionally, cautioned to avoid strenuous activity within 30  minutes of application -Colace 123XX123 mg p.o. twice daily for soft stools - Can consider starting oral laxative agent (ie, Miralax) to improve stool consistency and aid in fissure healing  - Sitz bath with warm water for 10-15 minutes 2-3 times daily; directed to [employer] available online or at [employer]; ensure to dry area afterwards - If no improvement with appropriate trial of therapy, will either change meds or can consider endosocpic evaluation  - To f/u in the clinic in 3 months or sooner prn    Lavena Bullion, DO, FACG  01/23/2019, 10:43 AM   [place], Percell Miller, PA-C

## 2019-01-23 NOTE — Patient Instructions (Signed)
Please purchase the following medications over the counter and take as directed:  Colace 100mg  twice daily  We have sent a prescription for nitroglycerin 0.125% gel to [facility]. You should apply a pea size amount to your rectum three times daily x 6-8 weeks.  [facility]'s information is below: Address: [address] [place], [place] [address]  [phone]  *Please DO NOT go directly from our office to pick up this medication! Give the pharmacy 1 day to process the prescription as this is compounded and takes time to make.

## 2019-01-27 DIAGNOSIS — M9901 Segmental and somatic dysfunction of cervical region: Secondary | ICD-10-CM | POA: Diagnosis not present

## 2019-01-27 DIAGNOSIS — M9902 Segmental and somatic dysfunction of thoracic region: Secondary | ICD-10-CM | POA: Diagnosis not present

## 2019-01-27 DIAGNOSIS — M9904 Segmental and somatic dysfunction of sacral region: Secondary | ICD-10-CM | POA: Diagnosis not present

## 2019-02-10 ENCOUNTER — Other Ambulatory Visit: Payer: Self-pay | Admitting: Infectious Diseases

## 2019-02-10 DIAGNOSIS — B009 Herpesviral infection, unspecified: Secondary | ICD-10-CM

## 2019-02-12 ENCOUNTER — Telehealth: Payer: Self-pay | Admitting: *Deleted

## 2019-02-12 NOTE — Telephone Encounter (Signed)
Patient called to ask for refill of valtrex.  Please advise, as she was last seen 02/07/2018, no follow up scheduled. Landis Gandy, RN

## 2019-02-13 ENCOUNTER — Other Ambulatory Visit: Payer: Self-pay | Admitting: *Deleted

## 2019-02-13 DIAGNOSIS — B009 Herpesviral infection, unspecified: Secondary | ICD-10-CM

## 2019-02-13 MED ORDER — VALACYCLOVIR HCL 1 G PO TABS: 500.0000 mg | ORAL_TABLET | Freq: Every day | ORAL | 3 refills | Status: DC

## 2019-02-13 NOTE — Telephone Encounter (Signed)
Ok to refill x 3 thanks

## 2019-02-16 ENCOUNTER — Encounter: Payer: Self-pay | Admitting: Family Medicine

## 2019-02-16 DIAGNOSIS — R0683 Snoring: Secondary | ICD-10-CM

## 2019-03-18 ENCOUNTER — Ambulatory Visit (INDEPENDENT_AMBULATORY_CARE_PROVIDER_SITE_OTHER): Payer: BC Managed Care – PPO | Admitting: Neurology

## 2019-03-18 ENCOUNTER — Other Ambulatory Visit: Payer: Self-pay

## 2019-03-18 ENCOUNTER — Encounter: Payer: Self-pay | Admitting: Neurology

## 2019-03-18 VITALS — BP 110/70 | HR 60 | Temp 98.2°F | Ht 71.0 in | Wt 228.0 lb

## 2019-03-18 DIAGNOSIS — R0683 Snoring: Secondary | ICD-10-CM

## 2019-03-18 DIAGNOSIS — F39 Unspecified mood [affective] disorder: Secondary | ICD-10-CM

## 2019-03-18 DIAGNOSIS — E669 Obesity, unspecified: Secondary | ICD-10-CM | POA: Diagnosis not present

## 2019-03-18 DIAGNOSIS — R519 Headache, unspecified: Secondary | ICD-10-CM

## 2019-03-18 DIAGNOSIS — G4719 Other hypersomnia: Secondary | ICD-10-CM | POA: Diagnosis not present

## 2019-03-18 NOTE — Patient Instructions (Signed)
It was good to see you again.  Based on your symptoms and your exam I believe you may be at risk for obstructive sleep apnea (aka OSA), and I think we should proceed with a sleep study to determine whether you do or do not have OSA and how severe it is. Even, if you have mild OSA, I may want you to consider treatment with CPAP, as treatment of even borderline or mild sleep apnea can result and improvement of symptoms such as sleep disruption, daytime sleepiness, nighttime bathroom breaks, restless leg symptoms, improvement of headache syndromes, even improved mood disorder.   Please remember, the long-term risks and ramifications of untreated moderate to severe obstructive sleep apnea are: increased Cardiovascular disease, including congestive heart failure, stroke, difficult to control hypertension, treatment resistant obesity, arrhythmias, especially irregular heartbeat commonly known as A. Fib. (atrial fibrillation); even type 2 diabetes has been linked to untreated OSA.   Sleep apnea can cause disruption of sleep and sleep deprivation in most cases, which, in turn, can cause recurrent headaches, problems with memory, mood, concentration, focus, and vigilance. Most people with untreated sleep apnea report excessive daytime sleepiness, which can affect their ability to drive. Please do not drive if you feel sleepy. Patients with sleep apnea developed difficulty initiating and maintaining sleep (aka insomnia).   Having sleep apnea may increase your risk for other sleep disorders, including involuntary behaviors sleep such as sleep terrors, sleep talking, sleepwalking.    Having sleep apnea can also increase your risk for restless leg syndrome and leg movements at night.   Please note that untreated obstructive sleep apnea may carry additional perioperative morbidity. Patients with significant obstructive sleep apnea (typically, in the moderate to severe degree) should receive, if possible, perioperative  PAP (positive airway pressure) therapy and the surgeons and particularly the anesthesiologists should be informed of the diagnosis and the severity of the sleep disordered breathing.   I will likely see you back after your sleep study to go over the test results and where to go from there. We will call you after your sleep study to advise about the results (most likely, you will hear from [place], my nurse) and to set up an appointment at the time, as necessary.    Our sleep lab administrative assistant will call you to schedule your sleep study and give you further instructions, regarding the check in process for the sleep study, arrival time, what to bring, when you can expect to leave after the study, etc., and to answer any other logistical questions you may have. If you don't hear back from her by about 2 weeks from now, please feel free to call her direct line at [phone] or you can call our general clinic number, or email [place] through My Chart.

## 2019-03-18 NOTE — Progress Notes (Signed)
Subjective:    Patient ID: Olivia Reyes is a 33 y.o. female.  HPI     Interim history:   Ms. Olivia Reyes is a 33 year old right-handed woman with an underlying medical history of allergic rhinitis, anxiety, depression, and obesity, who presents for reevaluation of her sleep disorder.  I first met her on 07/10/2017 at the request of her primary care physician, at which time she reported difficulty losing weight, not feeling rested, occasional morning headaches and snoring and daytime somnolence.  I recommended we proceed with a sleep study.  She was unable to pursue a sleep study at the time. Today, 03/18/2019: She reports fairly unchanged sleep related symptoms.  She had a hysterectomy in the interim.  She is sleepy during the day and does not wake up rested.  Her Epworth sleepiness score is 11 out of 24, fatigue severity score is 54 out of 63.  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  She quit smoking, she drinks alcohol rarely, she Drinks caffeine in the form of sweet tea, about 3 glasses/day on average.  They have a dog in the household but not on their bed at night.  She has no night to night nocturia but has had some morning headaches.  Bedtime is generally between 930 and 10 PM and rise time between 630 and 7 AM.   The patient's allergies, current medications, family history, past medical history, past social history, past surgical history and problem list were reviewed and updated as appropriate.   Previously:   07/10/2017: (She) reports snoring and excessive daytime somnolence. I reviewed your office note from 11/08/2016. She reports difficulty losing weight. She does not always wake up rested. She denies night nocturia but has had occasional morning headaches, in the past few months, sometimes she takes Excedrin Migraine for this. She has a maternal aunt with sleep apnea on a CPAP machine. Her mother has not been tested. She does not know her  father's medical history her father's side of the family history. She lives at home with her boyfriend, her 90 year old daughter and 47 year old Psychiatrist. She quit smoking in 2012, drinks alcohol maybe on the weekend, caffeine in the form of sweet tea about one cup per day on average, no soda and typically no coffee. She has been on Prozac daily and takes Xanax as needed, maybe 2 or 3 times a week on average. Her weight has been fluctuating. Her Epworth sleepiness score is 7 out of 24, fatigue or is 41 out of 63. She denies telltale symptoms of restless leg syndrome or leg twitching at night.  Her Past Medical History Is Significant For: Past Medical History:  Diagnosis Date  . Anxiety   . Chicken pox   . Complication of anesthesia    Prolonged sedation from anesthesia  . Family history of adverse reaction to anesthesia    family has prolonged sedation issues  . HSV infection   . Left ovarian cyst 03/2013   3.8 cm minimally complex left ovarian cyst  . moderate 07/01/2018   right low paraspinal, right upper abdomen  . Obesity   . Pelvic adhesions    Possible  . Pelvic pain   . Shingles 2019  . Strep throat   . Tonsil stone 2019    Her Past Surgical History Is Significant For: Past Surgical History:  Procedure Laterality Date  . CHOLECYSTECTOMY  2013  . DILITATION & CURRETTAGE/HYSTROSCOPY WITH HYDROTHERMAL ABLATION N/A 10/31/2018   Procedure: DILATATION &  CURETTAGE/HYSTEROSCOPY WITH attempted HYDROTHERMAL ABLATION;  Surgeon: Dian Queen, MD;  Location: [facility];  Service: Gynecology;  Laterality: N/A;  . HYSTERECTOMY ABDOMINAL WITH SALPINGO-OOPHORECTOMY Bilateral 11/27/2018   Procedure: HYSTERECTOMY ABDOMINAL WITH BILATERAL SALPINGECTOMY;  Surgeon: Dian Queen, MD;  Location: [facility];  Service: Gynecology;  Laterality: Bilateral;  . LAPAROSCOPIC TUBAL LIGATION Bilateral 10/31/2018   Procedure: attemted LAPAROSCOPIC TUBAL LIGATION;  Surgeon: Dian Queen,  MD;  Location: [facility];  Service: Gynecology;  Laterality: Bilateral;  . WISDOM TOOTH EXTRACTION    . WOUND EXPLORATION Left 09/12/2017   Procedure: LEFT HAND EXPLORATION;  Surgeon: Leanora Cover, MD;  Location: [facility];  Service: Orthopedics;  Laterality: Left;    Her Family History Is Significant For: Family History  Problem Relation Age of Onset  . Hypertension Mother   . Arthritis Maternal Grandmother   . Hyperlipidemia Maternal Grandmother   . Hypertension Maternal Grandmother   . Throat cancer Maternal Grandfather   . Stroke Maternal Aunt   . Healthy Sister        x1  . Healthy Daughter        x1    Her Social History Is Significant For: Social History   Socioeconomic History  . Marital status: Single    Spouse name: Not on file  . Number of children: Not on file  . Years of education: Not on file  . Highest education level: Not on file  Occupational History  . Not on file  Social Needs  . Financial resource strain: Not on file  . Food insecurity    Worry: Not on file    Inability: Not on file  . Transportation needs    Medical: Not on file    Non-medical: Not on file  Tobacco Use  . Smoking status: Former Smoker    Quit date: 07/27/2012    Years since quitting: 6.6  . Smokeless tobacco: Never Used  Substance and Sexual Activity  . Alcohol use: Yes    Alcohol/week: 1.0 standard drinks    Types: 1 Standard drinks or equivalent per week    Comment: rarely  . Drug use: No  . Sexual activity: Yes    Birth control/protection: None    Comment: men  Lifestyle  . Physical activity    Days per week: Not on file    Minutes per session: Not on file  . Stress: Not on file  Relationships  . Social Herbalist on phone: Not on file    Gets together: Not on file    Attends religious service: Not on file    Active member of club or organization: Not on file    Attends meetings of clubs or organizations: Not on file     Relationship status: Not on file  Other Topics Concern  . Not on file  Social History Narrative  . Not on file    Her Allergies Are:  No Known Allergies:   Her Current Medications Are:  Outpatient Encounter Medications as of 03/18/2019  Medication Sig  . citalopram (CELEXA) 20 MG tablet Take 20 mg by mouth at bedtime.   . clonazePAM (KLONOPIN) 0.5 MG tablet Take 0.5 mg by mouth 2 (two) times daily as needed for anxiety.  . valACYclovir (VALTREX) 1000 MG tablet Take 0.5 tablets (500 mg total) by mouth daily.  . [DISCONTINUED] AMBULATORY NON FORMULARY MEDICATION Medication Name: We have sent a prescription for nitroglycerin 0.125% gel to [facility]. You  should apply a pea size amount to your rectum two times daily x 6-8 weeks.  [facility]'s information is below: Address: [address], [place], [place] [address]  [phone]  *Please DO NOT go directly from our office to pick up this medication! Give the pharmacy 1 day to process the prescription as this is compounded and takes time to make.   No facility-administered encounter medications on file as of 03/18/2019.   :  Review of Systems:  Out of a complete 14 point review of systems, all are reviewed and negative with the exception of these symptoms as listed below:  Review of Systems  Neurological:       Pt presents today to discuss her sleep. Pt did not complete her sleep study last year because she could not afford it. Pt does endorse snoring.  Epworth Sleepiness Scale 0= would never doze 1= slight chance of dozing 2= moderate chance of dozing 3= high chance of dozing  Sitting and reading: 3 Watching TV: 2 Sitting inactive in a public place (ex. Theater or meeting): 1 As a passenger in a car for an hour without a break: 1 Lying down to rest in the afternoon: 3 Sitting and talking to someone: 0 Sitting quietly after lunch (no alcohol): 1 In a car, while stopped in traffic: 0 Total: 11      Objective:  Neurological Exam  Physical Exam Physical Examination:   Vitals:   03/18/19 1132  BP: 110/70  Pulse: 60  Temp: 98.2 F (36.8 C)   General Examination: The patient is a very pleasant 33 y.o. female in no acute distress. She appears well-developed and well-nourished and well groomed.   HEENT: Normocephalic, atraumatic, pupils are equal, round and reactive to light, Extraocular tracking is preserved, no nystagmus noted, hearing is grossly intact.  Face is symmetric with normal facial animation.  Speech is clear without hypophonia, voice tremor or dysarthria noted.  No carotid bruits. Oropharynx exam reveals: mild mouth dryness, adequate dental hygiene and moderate airway crowding, due to smaller airway entry, tonsils are 2+, Left side 2-3+ in size, Mallampati class II, neck circumference 15 and three-quarter inches. She has a minimal overbite. Tongue protrudes centrally and palate elevates symmetrically.   Chest: Clear to auscultation without wheezing, rhonchi or crackles noted.  Heart: S1+S2+0, regular and normal without murmurs, rubs or gallops noted.   Abdomen: Soft, non-tender and non-distended with normal bowel sounds appreciated on auscultation.  Extremities: There is no pitting edema in the distal lower extremities bilaterally.  Skin: Warm and dry without trophic changes noted.  Musculoskeletal: exam reveals no obvious joint deformities, tenderness or joint swelling or erythema.   Neurologically:  Mental status: The patient is awake, alert and oriented in all 4 spheres. Her immediate and remote memory, attention, language skills and fund of knowledge are appropriate. There is no evidence of aphasia, agnosia, apraxia or anomia. Speech is clear with normal prosody and enunciation. Thought process is linear. Mood is normal and affect is normal.  Cranial nerves II - XII are as described above under HEENT exam. Motor exam: Normal bulk, strength and tone is  noted. There is no tremor. Romberg is negative. Fine motor skills and coordination: grossly intact.  Cerebellar testing: No dysmetria or intention tremor. There is no truncal or gait ataxia.  Sensory exam: intact to light touch in the upper and lower extremities.  Gait, station and balance: She stands easily. No veering to one side is noted. No leaning to  one side is noted. Posture is age-appropriate and stance is narrow based. Gait shows normal stride length and normal pace. No problems turning are noted. Tandem walk is unremarkable.   Assessment and Plan:   In summary, ELANOR CALE is a very pleasant 33 year old female with an underlying medical history of allergic rhinitis, anxiety, depression, and obesity, who Presents for reevaluation of her sleep disorder, with her history and examination concerning for underlying obstructive sleep apnea (OSA). I Discussed the diagnosis of OSA, its prognosis and treatment options with her including CPAP therapy, dental and surgical treatment options.  I would recommend we proceed with sleep study testing.  She is willing to pursue sleep study testing.  She would be willing to try CPAP therapy if the need arises. I explained in particular the risks and ramifications of untreated moderate to severe OSA, especially with respect to developing cardiovascular disease down the Road, including congestive heart failure, difficult to treat hypertension, cardiac arrhythmias, or stroke. Even type 2 diabetes has, in part, been linked to untreated OSA. Symptoms of untreated OSA include daytime sleepiness, memory problems, mood irritability and mood disorder such as depression and anxiety, lack of energy, as well as recurrent headaches, especially morning headaches. We talked about the importance of good sleep hygiene. I answered all her questions today and the patient was in agreement. I plan to see her back after the sleep study is completed and encouraged her to call with any  interim questions, concerns, problems or updates.

## 2019-04-01 ENCOUNTER — Ambulatory Visit (INDEPENDENT_AMBULATORY_CARE_PROVIDER_SITE_OTHER): Payer: BC Managed Care – PPO | Admitting: Neurology

## 2019-04-01 DIAGNOSIS — E669 Obesity, unspecified: Secondary | ICD-10-CM

## 2019-04-01 DIAGNOSIS — G4730 Sleep apnea, unspecified: Secondary | ICD-10-CM

## 2019-04-01 DIAGNOSIS — R0683 Snoring: Secondary | ICD-10-CM

## 2019-04-01 DIAGNOSIS — R519 Headache, unspecified: Secondary | ICD-10-CM

## 2019-04-01 DIAGNOSIS — F39 Unspecified mood [affective] disorder: Secondary | ICD-10-CM

## 2019-04-01 DIAGNOSIS — G4719 Other hypersomnia: Secondary | ICD-10-CM

## 2019-04-01 DIAGNOSIS — G4710 Hypersomnia, unspecified: Secondary | ICD-10-CM | POA: Diagnosis not present

## 2019-04-06 DIAGNOSIS — F332 Major depressive disorder, recurrent severe without psychotic features: Secondary | ICD-10-CM | POA: Diagnosis not present

## 2019-04-06 DIAGNOSIS — F411 Generalized anxiety disorder: Secondary | ICD-10-CM | POA: Diagnosis not present

## 2019-04-06 DIAGNOSIS — F4310 Post-traumatic stress disorder, unspecified: Secondary | ICD-10-CM | POA: Diagnosis not present

## 2019-04-09 NOTE — Procedures (Signed)
    Patient Information     First Name: Olivia Last Name: Ronney Reyes ID: [id number]  Birth Date: 05-May-1986 Age: 33 Gender: Female  Referring Provider: Darreld Mclean, MD BMI: 32.1 (W=229 lb, H=5' 11'')  Neck Circ.:  15 '' Epworth:  11/24   Sleep Study Information    Study Date: Apr 01, 2019 S/H/A Version: 001.001.001.001 / 4.1.1528 / 63  History:    33 year old woman with a history of allergic rhinitis, anxiety, depression, and obesity, who reports snoring and excessive daytime sleepiness.  Summary & Diagnosis:    Sleep disordered breathing  Recommendations:      This home sleep test does not demonstrate any significant obstructive or central sleep disordered breathing with a borderline AHI of 4.8/hour and O2 nadir of 90%. While CPAP or autoPAP therapy are not warranted, weight loss and avoiding the supine sleep position will likely help in reducing her borderline sleep apnea. Other causes of the patient's symptoms, including circadian rhythm disturbances, an underlying mood disorder, medication effect and/or an underlying medical problem cannot be ruled out based on this test. Clinical correlation is recommended. The patient should be cautioned not to drive, work at heights, or operate dangerous or heavy equipment when tired or sleepy. Review and reiteration of good sleep hygiene measures should be pursued with any patient. The patient can follow up with her referring provider, who will be notified of the test results.  I certify that I have reviewed the raw data recording prior to the issuance of this report in accordance with the standards of the American Academy of Sleep Medicine (AASM).  Star Age, MD, PhD Diplomat, ABPN (Neurology and Sleep)                         Start Study Time: End Study Time: Total Recording Time:  9:38:38 PM 7:42:39 AM     10 h, 4 min  Total Sleep Time % REM of Sleep Time:  8 h, 12 min  26.1    Mean: 97 Minimum: 90 Maximum: 99  Mean of  Desaturations Nadirs (%):   93  Oxygen Desaturation. %: 4-9 10-20 >20 Total  Events Number Total  14 100.0  0 0.0  0 0.0  14 100.0  Oxygen Saturation: <90 <=88 <85 <80 <70  Duration (minutes): Sleep % 0.0 0.0 0.0 0.0 0.0 0.0 0.0 0.0 0.0 0.0     Respiratory Indices       Total Events REM NREM All Night  pRDI: pAHI: ODI:  78  39  14 21.5 11.2 4.2 5.3 2.5 0.8 9.5 4.8 1.7       Pulse Rate Statistics during Sleep (BPM)      Mean:  60 Minimum: 42 Maximum: 92    Oxygen Saturation Statistics   Sleep Summary   Indices are calculated using technically valid sleep time of  8 hrs, 11 min. pRDI/pAHI are calculated using oxi desaturations ? 3%                 Sleep Stages Chart

## 2019-04-09 NOTE — Progress Notes (Signed)
Patient was seen on 03/18/19 for sleep apnea concern, had HST on 04/01/19.  Please advise her that the home sleep test does not demonstrate any significant obstructive or central sleep disordered breathing with a borderline AHI of 4.8/hour and O2 nadir of 90%. While CPAP or autoPAP therapy are not warranted, weight loss and avoiding the supine sleep position will likely help in reducing her borderline sleep apnea. At this juncture, she can FU with PCP.   Thanks,  Star Age, MD, PhD [facility])

## 2019-04-13 ENCOUNTER — Telehealth: Payer: Self-pay

## 2019-04-13 DIAGNOSIS — N644 Mastodynia: Secondary | ICD-10-CM | POA: Diagnosis not present

## 2019-04-13 DIAGNOSIS — N6019 Diffuse cystic mastopathy of unspecified breast: Secondary | ICD-10-CM | POA: Diagnosis not present

## 2019-04-13 NOTE — Telephone Encounter (Signed)
I called pt to discuss her sleep study results. No answer, left a message asking her to call me back. 

## 2019-04-13 NOTE — Telephone Encounter (Signed)
-----   Message from Star Age, MD sent at 04/09/2019  6:27 PM EST ----- Patient was seen on 03/18/19 for sleep apnea concern, had HST on 04/01/19.  Please advise her that the home sleep test does not demonstrate any significant obstructive or central sleep disordered breathing with a borderline AHI of 4.8/hour and O2 nadir of 90%. While CPAP or autoPAP therapy are not warranted, weight loss and avoiding the supine sleep position will likely help in reducing her borderline sleep apnea. At this juncture, she can FU with PCP.   Thanks,  Star Age, MD, PhD [facility])

## 2019-04-15 NOTE — Telephone Encounter (Signed)
Pt returned my call. I discussed her sleep study results and recommendations with her. She will follow up with her PCP. Pt verbalized understanding of results. Pt had no questions at this time but was encouraged to call back if questions arise.

## 2019-05-14 DIAGNOSIS — F4310 Post-traumatic stress disorder, unspecified: Secondary | ICD-10-CM | POA: Diagnosis not present

## 2019-05-14 DIAGNOSIS — F332 Major depressive disorder, recurrent severe without psychotic features: Secondary | ICD-10-CM | POA: Diagnosis not present

## 2019-05-14 DIAGNOSIS — F411 Generalized anxiety disorder: Secondary | ICD-10-CM | POA: Diagnosis not present

## 2019-07-01 DIAGNOSIS — M9904 Segmental and somatic dysfunction of sacral region: Secondary | ICD-10-CM | POA: Diagnosis not present

## 2019-07-01 DIAGNOSIS — F4310 Post-traumatic stress disorder, unspecified: Secondary | ICD-10-CM | POA: Diagnosis not present

## 2019-07-01 DIAGNOSIS — M9901 Segmental and somatic dysfunction of cervical region: Secondary | ICD-10-CM | POA: Diagnosis not present

## 2019-07-01 DIAGNOSIS — F411 Generalized anxiety disorder: Secondary | ICD-10-CM | POA: Diagnosis not present

## 2019-07-01 DIAGNOSIS — M9902 Segmental and somatic dysfunction of thoracic region: Secondary | ICD-10-CM | POA: Diagnosis not present

## 2019-07-01 DIAGNOSIS — F332 Major depressive disorder, recurrent severe without psychotic features: Secondary | ICD-10-CM | POA: Diagnosis not present

## 2019-07-21 DIAGNOSIS — Z20828 Contact with and (suspected) exposure to other viral communicable diseases: Secondary | ICD-10-CM | POA: Diagnosis not present

## 2019-07-21 DIAGNOSIS — Z03818 Encounter for observation for suspected exposure to other biological agents ruled out: Secondary | ICD-10-CM | POA: Diagnosis not present

## 2019-08-03 DIAGNOSIS — F411 Generalized anxiety disorder: Secondary | ICD-10-CM | POA: Diagnosis not present

## 2019-08-03 DIAGNOSIS — F4310 Post-traumatic stress disorder, unspecified: Secondary | ICD-10-CM | POA: Diagnosis not present

## 2019-08-03 DIAGNOSIS — F332 Major depressive disorder, recurrent severe without psychotic features: Secondary | ICD-10-CM | POA: Diagnosis not present

## 2019-10-22 ENCOUNTER — Ambulatory Visit: Payer: BC Managed Care – PPO | Admitting: Physician Assistant

## 2019-10-26 DIAGNOSIS — F341 Dysthymic disorder: Secondary | ICD-10-CM | POA: Insufficient documentation

## 2019-10-26 DIAGNOSIS — J060 Acute laryngopharyngitis: Secondary | ICD-10-CM | POA: Diagnosis not present

## 2019-10-26 DIAGNOSIS — J029 Acute pharyngitis, unspecified: Secondary | ICD-10-CM | POA: Diagnosis not present

## 2019-10-26 DIAGNOSIS — J039 Acute tonsillitis, unspecified: Secondary | ICD-10-CM | POA: Diagnosis not present

## 2019-10-26 DIAGNOSIS — J0300 Acute streptococcal tonsillitis, unspecified: Secondary | ICD-10-CM | POA: Diagnosis not present

## 2019-10-28 DIAGNOSIS — F332 Major depressive disorder, recurrent severe without psychotic features: Secondary | ICD-10-CM | POA: Diagnosis not present

## 2019-10-28 DIAGNOSIS — F411 Generalized anxiety disorder: Secondary | ICD-10-CM | POA: Diagnosis not present

## 2019-10-28 DIAGNOSIS — F4310 Post-traumatic stress disorder, unspecified: Secondary | ICD-10-CM | POA: Diagnosis not present

## 2019-10-31 ENCOUNTER — Telehealth: Payer: BC Managed Care – PPO | Admitting: Nurse Practitioner

## 2019-10-31 DIAGNOSIS — B373 Candidiasis of vulva and vagina: Secondary | ICD-10-CM

## 2019-10-31 DIAGNOSIS — B3731 Acute candidiasis of vulva and vagina: Secondary | ICD-10-CM

## 2019-10-31 MED ORDER — FLUCONAZOLE 150 MG PO TABS: 150.0000 mg | ORAL_TABLET | Freq: Once | ORAL | 0 refills | Status: AC

## 2019-10-31 NOTE — Progress Notes (Signed)

## 2019-11-05 ENCOUNTER — Encounter: Payer: Self-pay | Admitting: Family Medicine

## 2019-11-05 ENCOUNTER — Telehealth (INDEPENDENT_AMBULATORY_CARE_PROVIDER_SITE_OTHER): Payer: BC Managed Care – PPO | Admitting: Family Medicine

## 2019-11-05 VITALS — Wt 220.0 lb

## 2019-11-05 DIAGNOSIS — B379 Candidiasis, unspecified: Secondary | ICD-10-CM | POA: Diagnosis not present

## 2019-11-05 DIAGNOSIS — B9689 Other specified bacterial agents as the cause of diseases classified elsewhere: Secondary | ICD-10-CM | POA: Diagnosis not present

## 2019-11-05 DIAGNOSIS — J329 Chronic sinusitis, unspecified: Secondary | ICD-10-CM

## 2019-11-05 DIAGNOSIS — J209 Acute bronchitis, unspecified: Secondary | ICD-10-CM

## 2019-11-05 MED ORDER — FLUCONAZOLE 150 MG PO TABS: 150.0000 mg | ORAL_TABLET | Freq: Once | ORAL | 0 refills | Status: AC

## 2019-11-05 MED ORDER — DOXYCYCLINE HYCLATE 100 MG PO TABS: 100.0000 mg | ORAL_TABLET | Freq: Two times a day (BID) | ORAL | 0 refills | Status: DC

## 2019-11-05 MED ORDER — PREDNISONE 20 MG PO TABS: 40.0000 mg | ORAL_TABLET | Freq: Every day | ORAL | 0 refills | Status: DC

## 2019-11-05 NOTE — Progress Notes (Signed)
VIRTUAL VISIT VIA VIDEO  I connected with Olivia Reyes on 11/05/19 at  9:00 AM EDT by elemedicine application and verified that I am speaking with the correct person using two identifiers. Location patient: Work Location provider: [facility], Office Persons participating in the virtual visit: Patient, Dr. Raoul Pitch and Jacob Moores, CMA  I discussed the limitations of evaluation and management by telemedicine and the availability of in person appointments. The patient expressed understanding and agreed to proceed.   SUBJECTIVE Chief Complaint  Patient presents with  . URI    sinus pressure/congestion, chest congestion-cough, seen at urgent care dx strep throat    HPI: Olivia Reyes is  34 y.o. female present for acute illness. She had been seen in the UC on May 31st. She had a strep test and it was negative, but the provider felt she likely had a strep infection. Her daughter was also ill and treated. Her daughter was tested for COVID and negative. Olivia Reyes has had he covid vaccines and was not tested. She was treated with amox BID x7 days. She reports her throat soreness has greatly improved, however her symptoms have worsened. She is endorsing more chest congestion, phlegm production, sinus pressure and pain. She finished the abx and has been taking sudafed and mucunex DM. Her daughter has made a full recovery. Olivia Reyes also states she developed symptoms of a yeast infection with use of the amox. She performed an E-visit nad was prescribed diflucan x1- she does not feel it resolved her yeast infection. She denies fever, chills, nausea, vomit, rash or diarrhea. She does not have a h/o allergies or asthma/copd.   ROS: See pertinent positives and negatives per HPI.  Patient Active Problem List   Diagnosis Date Noted  . Pelvic pain 11/27/2018  . S/P TAH (total abdominal hysterectomy) 11/27/2018  . HSV (herpes simplex virus) infection 02/07/2018  . Tonsil stone 11/05/2017  . Snoring  11/05/2017  . Rash and nonspecific skin eruption 11/15/2015  . Visit for preventive health examination 11/11/2015  . Obesity 11/11/2015  . Shingles rash 05/12/2014  . Anxiety 07/27/2013  . Abnormal Pap smear of cervix 07/27/2013    Social History   Tobacco Use  . Smoking status: Former Smoker    Quit date: 07/27/2012    Years since quitting: 7.2  . Smokeless tobacco: Never Used  Substance Use Topics  . Alcohol use: Yes    Alcohol/week: 1.0 standard drink    Types: 1 Standard drinks or equivalent per week    Comment: rarely    Current Outpatient Medications:  .  buPROPion (WELLBUTRIN XL) 150 MG 24 hr tablet, Take 150 mg by mouth daily., Disp: , Rfl:  .  celecoxib (CELEBREX) 200 MG capsule, Take 200 mg by mouth daily., Disp: , Rfl:  .  citalopram (CELEXA) 20 MG tablet, Take 20 mg by mouth at bedtime. , Disp: , Rfl:  .  clonazePAM (KLONOPIN) 0.5 MG tablet, Take 0.5 mg by mouth 2 (two) times daily as needed for anxiety., Disp: , Rfl:  .  triamterene-hydrochlorothiazide (MAXZIDE) 75-50 MG tablet, Take 1 tablet by mouth daily., Disp: , Rfl:  .  valACYclovir (VALTREX) 1000 MG tablet, Take 0.5 tablets (500 mg total) by mouth daily., Disp: 90 tablet, Rfl: 3 .  doxycycline (VIBRA-TABS) 100 MG tablet, Take 1 tablet (100 mg total) by mouth 2 (two) times daily., Disp: 20 tablet, Rfl: 0 .  fluconazole (DIFLUCAN) 150 MG tablet, Take 1 tablet (150 mg total) by mouth  once for 1 dose., Disp: 1 tablet, Rfl: 0 .  predniSONE (DELTASONE) 20 MG tablet, Take 2 tablets (40 mg total) by mouth daily with breakfast., Disp: 10 tablet, Rfl: 0  No Known Allergies  OBJECTIVE: Wt 220 lb (99.8 kg)   BMI 30.68 kg/m  Gen: No acute distress. Nontoxic in appearance. Appears fatigued. Sounds congested HENT: AT. [place].  MMM.  Eyes:Pupils Equal Round Reactive to light, Extraocular movements intact,  Conjunctiva without redness, discharge or icterus. Chest: Cough or shortness of breath not appreciated during visit.    Skin: no rashes, purpura or petechiae.  Neuro:  Normal gait. Alert. Oriented x3    ASSESSMENT AND PLAN: Olivia Reyes is a 34 y.o. female present for  Bacterial sinusitis/Acute bronchitis with symptoms > 10 days Rest, hydrate.  Continue  mucinex (DM if cough), nettie pot or nasal saline.  Try to avoid additional sudafed use if able.  Doxycycline and prednisone burst  prescribed, take until completed.  If cough present it can last up to 6-8 weeks.  F/U 2 weeks of not improved with PCP  Yeast infection: Encouraged patient to start Monistat 3-7 day OTC ovule treatment.  - diflucan x1 also prescribed.    Howard Pouch, DO 11/05/2019   No follow-ups on file.  No orders of the defined types were placed in this encounter.  Meds ordered this encounter  Medications  . doxycycline (VIBRA-TABS) 100 MG tablet    Sig: Take 1 tablet (100 mg total) by mouth 2 (two) times daily.    Dispense:  20 tablet    Refill:  0  . fluconazole (DIFLUCAN) 150 MG tablet    Sig: Take 1 tablet (150 mg total) by mouth once for 1 dose.    Dispense:  1 tablet    Refill:  0  . predniSONE (DELTASONE) 20 MG tablet    Sig: Take 2 tablets (40 mg total) by mouth daily with breakfast.    Dispense:  10 tablet    Refill:  0   Referral Orders  No referral(s) requested today

## 2020-01-20 DIAGNOSIS — F4310 Post-traumatic stress disorder, unspecified: Secondary | ICD-10-CM | POA: Diagnosis not present

## 2020-01-20 DIAGNOSIS — F411 Generalized anxiety disorder: Secondary | ICD-10-CM | POA: Diagnosis not present

## 2020-01-20 DIAGNOSIS — F332 Major depressive disorder, recurrent severe without psychotic features: Secondary | ICD-10-CM | POA: Diagnosis not present

## 2020-02-02 ENCOUNTER — Ambulatory Visit: Payer: BC Managed Care – PPO | Admitting: Gastroenterology

## 2020-02-02 ENCOUNTER — Encounter: Payer: Self-pay | Admitting: Gastroenterology

## 2020-02-02 VITALS — BP 116/74 | HR 80 | Ht 71.0 in | Wt 224.5 lb

## 2020-02-02 DIAGNOSIS — K921 Melena: Secondary | ICD-10-CM

## 2020-02-02 DIAGNOSIS — R197 Diarrhea, unspecified: Secondary | ICD-10-CM

## 2020-02-02 DIAGNOSIS — R194 Change in bowel habit: Secondary | ICD-10-CM | POA: Diagnosis not present

## 2020-02-02 DIAGNOSIS — K5900 Constipation, unspecified: Secondary | ICD-10-CM

## 2020-02-02 DIAGNOSIS — Z8719 Personal history of other diseases of the digestive system: Secondary | ICD-10-CM

## 2020-02-02 MED ORDER — CLENPIQ 10-3.5-12 MG-GM -GM/160ML PO SOLN: 1.0000 | ORAL | 0 refills | Status: DC

## 2020-02-02 NOTE — Patient Instructions (Addendum)
If you are age 33 or older, your body mass index should be between 23-30. Your Body mass index is 31.31 kg/m. If this is out of the aforementioned range listed, please consider follow up with your Primary Care Provider.  If you are age 10 or younger, your body mass index should be between 19-25. Your Body mass index is 31.31 kg/m. If this is out of the aformentioned range listed, please consider follow up with your Primary Care Provider.   You have been scheduled for a colonoscopy. Please follow written instructions given to you at your visit today.  Please pick up your prep supplies at the pharmacy within the next 1-3 days. If you use inhalers (even only as needed), please bring them with you on the day of your procedure.  We have sent the following medications to your pharmacy for you to pick up at your convenience: Clenpiq  It was a pleasure to see you today!  Vito Cirigliano, D.O.

## 2020-02-02 NOTE — Progress Notes (Signed)
P  Chief Complaint:    Change in bowel habits  GI History: 34 year old female initially seen in 12/2018 with anal fissure.  Symptoms started after hysterectomy w b/l salpingectomy on 11/27/2018 with associated postoperative constipation.  Postop hemoglobin 11.3 (baseline 13.5), transfused 1U PRBC.  Diagnosed with anal fissure treated with topical NTG and Colace with resolution.  Endoscopic History: -None  HPI:     Patient is a 34 y.o. female presenting to the [facility] for follow-up.  Was last seen by me in 12/2018, and treated with topical NTG for anal fissure.  She states that her index symptoms resolved for many months, and more recently started to have changes in bowel habits followed by recurrence of hematochezia, dyschezia in the last 2 weeks or so.  Describes baseline as 1 formed BM Q3-4 days without straining for as long she can remember.  More recently has had straining to have BM, followed by alternating watery, sometimes bloody, diarrhea.  Diarrhea is 1-2 episodes, resolves within 24 hours, then reverts back to constipation.  + Abdominal bloating with constipation, which relieves with BM.  Trialed dietary modifications, probiotics, fiber capsule, MiraLAX prn, but no appreciable change in bowel habits.  Then developed hematochezia and dyschezia in the last 2 weeks, suspicious for fissure recurrence.  Symptoms not as bad as 1 year ago.  She tried treating with topical NTG leftover from last year, without any change.  Trialed OTC hemorrhoid cream without improvement.  Bleeding can occur with diarrhea or constipation.  Tolerating PO intake without issue.   No recent new labs/rads for review.    Review of systems:     No chest pain, no SOB, no fevers, no urinary sx   Past Medical History:  Diagnosis Date  . Anxiety   . Chicken pox   . Complication of anesthesia    Prolonged sedation from anesthesia  . Family history of adverse reaction to anesthesia    family  has prolonged sedation issues  . HSV infection   . Left ovarian cyst 03/2013   3.8 cm minimally complex left ovarian cyst  . moderate 07/01/2018   right low paraspinal, right upper abdomen  . Obesity   . Pelvic adhesions    Possible  . Pelvic pain   . Shingles 2019  . Strep throat   . Tonsil stone 2019    Patient's surgical history, family medical history, social history, medications and allergies were all reviewed in Epic    Current Outpatient Medications  Medication Sig Dispense Refill  . buPROPion (WELLBUTRIN XL) 150 MG 24 hr tablet Take 150 mg by mouth daily.    . celecoxib (CELEBREX) 200 MG capsule Take 200 mg by mouth daily.    . citalopram (CELEXA) 20 MG tablet Take 30 mg by mouth at bedtime.     . clonazePAM (KLONOPIN) 0.5 MG tablet Take 0.5 mg by mouth 2 (two) times daily as needed for anxiety.    . triamterene-hydrochlorothiazide (MAXZIDE) 75-50 MG tablet Take 1 tablet by mouth daily.    . valACYclovir (VALTREX) 1000 MG tablet Take 0.5 tablets (500 mg total) by mouth daily. 90 tablet 3   No current facility-administered medications for this visit.    Physical Exam:     BP 116/74   Pulse 80   Ht 5\' 11"  (1.803 m)   Wt 224 lb 8 oz (101.8 kg)   BMI 31.31 kg/m   GENERAL:  Pleasant female in NAD PSYCH: : Cooperative, normal affect EENT:  conjunctiva pink, mucous membranes moist, neck supple without masses CARDIAC:  RRR, no murmur heard, no peripheral edema PULM: Normal respiratory effort, lungs CTA bilaterally, no wheezing ABDOMEN:  Nondistended, soft, nontender. No obvious masses, no hepatomegaly,  normal bowel sounds SKIN:  turgor, no lesions seen Musculoskeletal:  Normal muscle tone, normal strength NEURO: Alert and oriented x 3, no focal neurologic deficits Rectal: Exam deferred  to time of colonoscopy.    IMPRESSION and PLAN:    1) Hematochezia 2) History of anal fissure 3) Constipation 4) Diarrhea 5) Change in bowel habits 6) Abdominal  bloating  Suspect constipation predominant with overflow diarrhea with subsequent exacerbation of known history of anal fissure.  Patient very concerned for more proximal etiology of symptoms.  Given change in bowel habits, duration of symptoms, heightened patient concerns, plan for colonoscopy to rule out additional mucosal/luminal pathology.  -Colonoscopy with 2-day bowel prep -Discussed trial of MiraLAX twice daily -Repeat rectal exam under anesthesia at time of colonoscopy  The indications, risks, and benefits of colonoscopy were explained to the patient in detail. Risks include but are not limited to bleeding, perforation, adverse reaction to medications, and cardiopulmonary compromise. Sequelae include but are not limited to the possibility of surgery, hospitalization, and mortality. The patient verbalized understanding and wished to proceed. All questions answered, referred to the scheduler and bowel prep ordered. Further recommendations pending results of the exam.            Lavena Bullion ,DO, FACG 02/02/2020, 1:57 PM

## 2020-02-03 ENCOUNTER — Encounter: Payer: Self-pay | Admitting: Gastroenterology

## 2020-02-08 DIAGNOSIS — Z01419 Encounter for gynecological examination (general) (routine) without abnormal findings: Secondary | ICD-10-CM | POA: Diagnosis not present

## 2020-02-08 DIAGNOSIS — Z6832 Body mass index (BMI) 32.0-32.9, adult: Secondary | ICD-10-CM | POA: Diagnosis not present

## 2020-02-10 ENCOUNTER — Other Ambulatory Visit: Payer: Self-pay

## 2020-02-10 ENCOUNTER — Ambulatory Visit
Admission: RE | Admit: 2020-02-10 | Discharge: 2020-02-10 | Disposition: A | Discharge status: Home / Self Care | Payer: BC Managed Care – PPO | Source: Ambulatory Visit | Attending: Family Medicine | Admitting: Family Medicine

## 2020-02-10 ENCOUNTER — Ambulatory Visit: Payer: BC Managed Care – PPO | Admitting: Family Medicine

## 2020-02-10 ENCOUNTER — Encounter: Payer: Self-pay | Admitting: Family Medicine

## 2020-02-10 VITALS — BP 124/68 | HR 54 | Resp 16 | Ht 71.0 in | Wt 218.0 lb

## 2020-02-10 DIAGNOSIS — Z1159 Encounter for screening for other viral diseases: Secondary | ICD-10-CM

## 2020-02-10 DIAGNOSIS — Z13220 Encounter for screening for lipoid disorders: Secondary | ICD-10-CM | POA: Diagnosis not present

## 2020-02-10 DIAGNOSIS — Z131 Encounter for screening for diabetes mellitus: Secondary | ICD-10-CM

## 2020-02-10 DIAGNOSIS — Z130 Encounter for screening for diseases of the blood and blood-forming organs and certain disorders involving the immune mechanism: Secondary | ICD-10-CM

## 2020-02-10 DIAGNOSIS — R0781 Pleurodynia: Secondary | ICD-10-CM | POA: Diagnosis not present

## 2020-02-10 DIAGNOSIS — Z1329 Encounter for screening for other suspected endocrine disorder: Secondary | ICD-10-CM

## 2020-02-10 NOTE — Patient Instructions (Signed)
It was good to see you again today!  Please get you flu shot later this fall I will be in touch with your labs Please go to [facility] to get your rib and chest films   Address: [address], [place], [place] [address] Phone: [phone]

## 2020-02-10 NOTE — Progress Notes (Addendum)
[facility] [address], [place], [place] [address] [phone]  Date:  02/10/2020   Name:  Olivia Reyes   DOB:  [birth year]   MRN:  [id number]  PCP:  Darreld Mclean, MD    Chief Complaint: Rib Pain (left side pain, feels the pain "behind the rib"  2 weeks, no known injruy)   History of Present Illness:  Olivia Reyes is a 34 y.o. very pleasant female patient who presents with the following:  Patient today with concern of rib pain  last seen by myself about 2 years ago, August 2019  Today pt notes a pain in her left ribs for about 2 weeks This is not getting better but also not worse It feels deep  She does not feel anything on the surface She is going to the gym like she normally does - no change in her activity level NKI She cannot think of anything that really makes it worse No CP or SOB- although the area can hurt when she takes a deep breath  No fever or vomiting Nothing on the skin  No cough  Hep C screening Pap- has GYN  Flu vaccine- she will defer for now  COVID-19 vaccine is complete  Patient Active Problem List   Diagnosis Date Noted  . Pelvic pain 11/27/2018  . S/P TAH (total abdominal hysterectomy) 11/27/2018  . HSV (herpes simplex virus) infection 02/07/2018  . Tonsil stone 11/05/2017  . Snoring 11/05/2017  . Rash and nonspecific skin eruption 11/15/2015  . Visit for preventive health examination 11/11/2015  . Obesity 11/11/2015  . Shingles rash 05/12/2014  . Anxiety 07/27/2013  . Abnormal Pap smear of cervix 07/27/2013    Past Medical History:  Diagnosis Date  . Anxiety   . Chicken pox   . Complication of anesthesia    Prolonged sedation from anesthesia  . Family history of adverse reaction to anesthesia    family has prolonged sedation issues  . HSV infection   . Left ovarian cyst 03/2013   3.8 cm minimally complex left ovarian cyst  . moderate 07/01/2018   right low  paraspinal, right upper abdomen  . Obesity   . Pelvic adhesions    Possible  . Pelvic pain   . Shingles 2019  . Strep throat   . Tonsil stone 2019    Past Surgical History:  Procedure Laterality Date  . CHOLECYSTECTOMY  2013  . DILITATION & CURRETTAGE/HYSTROSCOPY WITH HYDROTHERMAL ABLATION N/A 10/31/2018   Procedure: DILATATION & CURETTAGE/HYSTEROSCOPY WITH attempted HYDROTHERMAL ABLATION;  Surgeon: Dian Queen, MD;  Location: [facility];  Service: Gynecology;  Laterality: N/A;  . HYSTERECTOMY ABDOMINAL WITH SALPINGO-OOPHORECTOMY Bilateral 11/27/2018   Procedure: HYSTERECTOMY ABDOMINAL WITH BILATERAL SALPINGECTOMY;  Surgeon: Dian Queen, MD;  Location: [facility];  Service: Gynecology;  Laterality: Bilateral;  . LAPAROSCOPIC TUBAL LIGATION Bilateral 10/31/2018   Procedure: attemted LAPAROSCOPIC TUBAL LIGATION;  Surgeon: Dian Queen, MD;  Location: [facility];  Service: Gynecology;  Laterality: Bilateral;  . WISDOM TOOTH EXTRACTION    . WOUND EXPLORATION Left 09/12/2017   Procedure: LEFT HAND EXPLORATION;  Surgeon: Leanora Cover, MD;  Location: [facility];  Service: Orthopedics;  Laterality: Left;    Social History   Tobacco Use  . Smoking status: Former Smoker    Quit date: 07/27/2012    Years since quitting: 7.5  . Smokeless tobacco: Never Used  Vaping Use  .  Vaping Use: Never used  Substance Use Topics  . Alcohol use: Yes    Alcohol/week: 1.0 standard drink    Types: 1 Standard drinks or equivalent per week    Comment: rarely  . Drug use: No    Family History  Problem Relation Age of Onset  . Hypertension Mother   . Arthritis Maternal Grandmother   . Hyperlipidemia Maternal Grandmother   . Hypertension Maternal Grandmother   . Throat cancer Maternal Grandfather   . Stroke Maternal Aunt   . Healthy Sister        x1  . Healthy Daughter        x1    No Known Allergies  Medication list has been reviewed and  updated.  Current Outpatient Medications on File Prior to Visit  Medication Sig Dispense Refill  . buPROPion (WELLBUTRIN XL) 150 MG 24 hr tablet Take 150 mg by mouth daily.    . celecoxib (CELEBREX) 200 MG capsule Take 200 mg by mouth daily.    . citalopram (CELEXA) 20 MG tablet Take 30 mg by mouth at bedtime.     . clonazePAM (KLONOPIN) 0.5 MG tablet Take 0.5 mg by mouth 2 (two) times daily as needed for anxiety.    . Sod Picosulfate-Mag Ox-Cit Acd (CLENPIQ) 10-3.5-12 MG-GM -GM/160ML SOLN Take 1 kit by mouth as directed. 320 mL 0  . triamterene-hydrochlorothiazide (MAXZIDE) 75-50 MG tablet Take 1 tablet by mouth daily.    . valACYclovir (VALTREX) 1000 MG tablet Take 0.5 tablets (500 mg total) by mouth daily. 90 tablet 3   No current facility-administered medications on file prior to visit.    Review of Systems:  As per HPI- otherwise negative. LMP - she is s/p hysterectomy    Physical Examination: Vitals:   02/10/20 0958  BP: 124/68  Pulse: (!) 54  Resp: 16  SpO2: 97%   Vitals:   02/10/20 0958  Weight: 218 lb (98.9 kg)  Height: '5\' 11"'  (1.803 m)   Body mass index is 30.4 kg/m. Ideal Body Weight: Weight in (lb) to have BMI = 25: 178.9  GEN: no acute distress.  Overweight, looks well  HEENT: Atraumatic, Normocephalic.  Ears and Nose: No external deformity. CV: RRR, No M/G/R. No JVD. No thrill. No extra heart sounds. PULM: CTA B, no wheezes, crackles, rhonchi. No retractions. No resp. distress. No accessory muscle use. ABD: S, NT, ND, +BS. No rebound. No HSM. EXTR: No c/c/e PSYCH: Normally interactive. Conversant.  Distinct tenderness on the left lower rib border - no redness, mass or swelling noted  Assessment and Plan: Rib pain on left side - Plan: DG Ribs Unilateral W/Chest Left  Screening for deficiency anemia - Plan: CBC  Screening for diabetes mellitus - Plan: Comprehensive metabolic panel, Hemoglobin A1c  Encounter for hepatitis C screening test for low risk  patient - Plan: Hepatitis C antibody  Screening for thyroid disorder - Plan: TSH  Screening, lipid - Plan: Lipid panel  Pt here today with concern of pain in her left lower ribs for the last couple of weeks Suspect MSK issue- will obtain plain films for her today to ensure nothing unexpected Labs are pending If labs and films are reassuring plan to observe, suspect this will resolve in the next few weeks- she will let me know if sx continue or get worse  This visit occurred during the SARS-CoV-2 public health emergency.  Safety protocols were in place, including screening questions prior to the visit, additional usage of staff PPE,  and extensive cleaning of exam room while observing appropriate contact time as indicated for disinfecting solutions.    Signed Lamar Blinks, MD  Addendum 9/16, received her labs as below.  Message to patient I have already informed her that her rib films are normal DG Ribs Unilateral W/Chest Left  Result Date: 02/10/2020 CLINICAL DATA:  Left rib pain 2 weeks.  No injury. EXAM: LEFT RIBS AND CHEST - 3+ VIEW COMPARISON:  None. FINDINGS: No fracture or other bone lesions are seen involving the ribs. There is no evidence of pneumothorax or pleural effusion. Both lungs are clear. Heart size and mediastinal contours are within normal limits. IMPRESSION: Negative. Electronically Signed   By: Franchot Gallo M.D.   On: 02/10/2020 11:15     Results for orders placed or performed in visit on 02/10/20  CBC  Result Value Ref Range   WBC 7.1 3.8 - 10.8 Thousand/uL   RBC 4.76 3.80 - 5.10 Million/uL   Hemoglobin 14.8 11.7 - 15.5 g/dL   HCT 43.9 35 - 45 %   MCV 92.2 80.0 - 100.0 fL   MCH 31.1 27.0 - 33.0 pg   MCHC 33.7 32.0 - 36.0 g/dL   RDW 12.1 11.0 - 15.0 %   Platelets 302 140 - 400 Thousand/uL   MPV 11.1 7.5 - 12.5 fL  Comprehensive metabolic panel  Result Value Ref Range   Glucose, Bld 88 65 - 99 mg/dL   BUN 15 7 - 25 mg/dL   Creat 0.84 0.50 - 1.10 mg/dL    BUN/Creatinine Ratio NOT APPLICABLE 6 - 22 (calc)   Sodium 138 135 - 146 mmol/L   Potassium 4.5 3.5 - 5.3 mmol/L   Chloride 102 98 - 110 mmol/L   CO2 25 20 - 32 mmol/L   Calcium 10.2 8.6 - 10.2 mg/dL   Total Protein 7.6 6.1 - 8.1 g/dL   Albumin 4.8 3.6 - 5.1 g/dL   Globulin 2.8 1.9 - 3.7 g/dL (calc)   AG Ratio 1.7 1.0 - 2.5 (calc)   Total Bilirubin 0.6 0.2 - 1.2 mg/dL   Alkaline phosphatase (APISO) 65 31 - 125 U/L   AST 14 10 - 30 U/L   ALT 11 6 - 29 U/L  Hemoglobin A1c  Result Value Ref Range   Hgb A1c MFr Bld 5.3 <5.7 % of total Hgb   Mean Plasma Glucose 105 (calc)   eAG (mmol/L) 5.8 (calc)  Lipid panel  Result Value Ref Range   Cholesterol 214 (H) <200 mg/dL   HDL 54 > OR = 50 mg/dL   Triglycerides 144 <150 mg/dL   LDL Cholesterol (Calc) 133 (H) mg/dL (calc)   Total CHOL/HDL Ratio 4.0 <5.0 (calc)   Non-HDL Cholesterol (Calc) 160 (H) <130 mg/dL (calc)  TSH  Result Value Ref Range   TSH 2.26 mIU/L

## 2020-02-11 ENCOUNTER — Encounter: Payer: Self-pay | Admitting: Family Medicine

## 2020-02-11 LAB — LIPID PANEL
Cholesterol: 214 mg/dL — ABNORMAL HIGH (ref <200)
HDL: 54 mg/dL (ref >=50)
LDL Cholesterol (Calc): 133 mg/dL (calc) — ABNORMAL HIGH
Non-HDL Cholesterol (Calc): 160 mg/dL (calc) — ABNORMAL HIGH (ref <130)
Total CHOL/HDL Ratio: 4 (calc) (ref <5.0)
Triglycerides: 144 mg/dL (ref <150)

## 2020-02-11 LAB — COMPREHENSIVE METABOLIC PANEL
AG Ratio: 1.7 (calc) (ref 1.0–2.5)
ALT: 11 U/L (ref 6–29)
AST: 14 U/L (ref 10–30)
Albumin: 4.8 g/dL (ref 3.6–5.1)
Alkaline phosphatase (APISO): 65 U/L (ref 31–125)
BUN: 15 mg/dL (ref 7–25)
CO2: 25 mmol/L (ref 20–32)
Calcium: 10.2 mg/dL (ref 8.6–10.2)
Chloride: 102 mmol/L (ref 98–110)
Creat: 0.84 mg/dL (ref 0.50–1.10)
Globulin: 2.8 g/dL (calc) (ref 1.9–3.7)
Glucose, Bld: 88 mg/dL (ref 65–99)
Potassium: 4.5 mmol/L (ref 3.5–5.3)
Sodium: 138 mmol/L (ref 135–146)
Total Bilirubin: 0.6 mg/dL (ref 0.2–1.2)
Total Protein: 7.6 g/dL (ref 6.1–8.1)

## 2020-02-11 LAB — CBC
HCT: 43.9 % (ref 35.0–45.0)
Hemoglobin: 14.8 g/dL (ref 11.7–15.5)
MCH: 31.1 pg (ref 27.0–33.0)
MCHC: 33.7 g/dL (ref 32.0–36.0)
MCV: 92.2 fL (ref 80.0–100.0)
MPV: 11.1 fL (ref 7.5–12.5)
Platelets: 302 10*3/uL (ref 140–400)
RBC: 4.76 10*6/uL (ref 3.80–5.10)
RDW: 12.1 % (ref 11.0–15.0)
WBC: 7.1 10*3/uL (ref 3.8–10.8)

## 2020-02-11 LAB — HEMOGLOBIN A1C
Hgb A1c MFr Bld: 5.3 % of total Hgb (ref <5.7)
Mean Plasma Glucose: 105 (calc)
eAG (mmol/L): 5.8 (calc)

## 2020-02-11 LAB — HEPATITIS C ANTIBODY
Hepatitis C Ab: NONREACTIVE
SIGNAL TO CUT-OFF: 0 (ref <1.00)

## 2020-02-11 LAB — TSH: TSH: 2.26 mIU/L

## 2020-02-12 ENCOUNTER — Encounter: Payer: Self-pay | Admitting: Gastroenterology

## 2020-02-12 ENCOUNTER — Ambulatory Visit (AMBULATORY_SURGERY_CENTER): Payer: BC Managed Care – PPO | Admitting: Gastroenterology

## 2020-02-12 ENCOUNTER — Other Ambulatory Visit: Payer: Self-pay

## 2020-02-12 VITALS — BP 94/47 | HR 55 | Temp 98.6°F | Resp 12 | Ht 71.0 in | Wt 224.0 lb

## 2020-02-12 DIAGNOSIS — D125 Benign neoplasm of sigmoid colon: Secondary | ICD-10-CM | POA: Diagnosis not present

## 2020-02-12 DIAGNOSIS — R197 Diarrhea, unspecified: Secondary | ICD-10-CM | POA: Diagnosis not present

## 2020-02-12 DIAGNOSIS — K640 First degree hemorrhoids: Secondary | ICD-10-CM

## 2020-02-12 DIAGNOSIS — K5730 Diverticulosis of large intestine without perforation or abscess without bleeding: Secondary | ICD-10-CM | POA: Diagnosis not present

## 2020-02-12 DIAGNOSIS — D127 Benign neoplasm of rectosigmoid junction: Secondary | ICD-10-CM | POA: Diagnosis not present

## 2020-02-12 DIAGNOSIS — K921 Melena: Secondary | ICD-10-CM | POA: Diagnosis not present

## 2020-02-12 DIAGNOSIS — D128 Benign neoplasm of rectum: Secondary | ICD-10-CM | POA: Diagnosis not present

## 2020-02-12 DIAGNOSIS — Z1211 Encounter for screening for malignant neoplasm of colon: Secondary | ICD-10-CM | POA: Diagnosis not present

## 2020-02-12 DIAGNOSIS — K6389 Other specified diseases of intestine: Secondary | ICD-10-CM | POA: Diagnosis not present

## 2020-02-12 MED ORDER — SODIUM CHLORIDE 0.9 % IV SOLN: 500.0000 mL | Freq: Once | INTRAVENOUS | Status: DC

## 2020-02-12 NOTE — Progress Notes (Signed)
Called to room to assist during endoscopic procedure.  Patient ID and intended procedure confirmed with present staff. Received instructions for my participation in the procedure from the performing physician.  

## 2020-02-12 NOTE — Progress Notes (Signed)
A and O x3. Report to RN. Tolerated MAC anesthesia well.

## 2020-02-12 NOTE — Patient Instructions (Signed)
YOU HAD AN ENDOSCOPIC PROCEDURE TODAY AT [facility]:   Refer to the procedure report that was given to you for any specific questions about what was found during the examination.  If the procedure report does not answer your questions, please call your gastroenterologist to clarify.  If you requested that your care partner not be given the details of your procedure findings, then the procedure report has been included in a sealed envelope for you to review at your convenience later.  YOU SHOULD EXPECT: Some feelings of bloating in the abdomen. Passage of more gas than usual.  Walking can help get rid of the air that was put into your GI tract during the procedure and reduce the bloating. If you had a lower endoscopy (such as a colonoscopy or flexible sigmoidoscopy) you may notice spotting of blood in your stool or on the toilet paper. If you underwent a bowel prep for your procedure, you may not have a normal bowel movement for a few days.  Please Note:  You might notice some irritation and congestion in your nose or some drainage.  This is from the oxygen used during your procedure.  There is no need for concern and it should clear up in a day or so.  SYMPTOMS TO REPORT IMMEDIATELY:   Following lower endoscopy (colonoscopy or flexible sigmoidoscopy):  Excessive amounts of blood in the stool  Significant tenderness or worsening of abdominal pains  Swelling of the abdomen that is new, acute  Fever of 100F or higher   For urgent or emergent issues, a gastroenterologist can be reached at any hour by calling [phone]. Do not use MyChart messaging for urgent concerns.    DIET:  We do recommend a small meal at first, but then you may proceed to your regular diet.  Drink plenty of fluids but you should avoid alcoholic beverages for 24 hours. Use a fiber supplement such as: metamucil, fibercon, citrucel or Konsyl.  ACTIVITY:  You should plan to take it easy for the rest of  today and you should NOT DRIVE or use heavy machinery until tomorrow (because of the sedation medicines used during the test).    FOLLOW UP: Our staff will call the number listed on your records 48-72 hours following your procedure to check on you and address any questions or concerns that you may have regarding the information given to you following your procedure. If we do not reach you, we will leave a message.  We will attempt to reach you two times.  During this call, we will ask if you have developed any symptoms of COVID 19. If you develop any symptoms (ie: fever, flu-like symptoms, shortness of breath, cough etc.) before then, please call [phone].  If you test positive for Covid 19 in the 2 weeks post procedure, please call and report this information to [place].    If any biopsies were taken you will be contacted by phone or by letter within the next 1-3 weeks.  Please call us at [phone] if you have not heard about the biopsies in 3 weeks.    SIGNATURES/CONFIDENTIALITY: You and/or your care partner have signed paperwork which will be entered into your electronic medical record.  These signatures attest to the fact that that the information above on your After Visit Summary has been reviewed and is understood.  Full responsibility of the confidentiality of this discharge information lies with you and/or your care-partner.

## 2020-02-12 NOTE — Op Note (Signed)
[facility] Patient Name: Olivia Reyes Procedure Date: 02/12/2020 8:34 AM MRN: [id number] Endoscopist: Gerrit Heck , MD Age: 34 Referring MD:  Date of Birth: November 26, 1985 Gender: Female Account #: [id number] Procedure:                Colonoscopy Indications:              Hematochezia, Change in bowel habits, Constipation,                            Diarrhea, Rectal pain, History of Anal Fissure Medicines:                Monitored Anesthesia Care Procedure:                Pre-Anesthesia Assessment:                           - Prior to the procedure, a History and Physical                            was performed, and patient medications and                            allergies were reviewed. The patient's tolerance of                            previous anesthesia was also reviewed. The risks                            and benefits of the procedure and the sedation                            options and risks were discussed with the patient.                            All questions were answered, and informed consent                            was obtained. Prior Anticoagulants: The patient has                            taken no previous anticoagulant or antiplatelet                            agents. ASA Grade Assessment: II - A patient with                            mild systemic disease. After reviewing the risks                            and benefits, the patient was deemed in                            satisfactory condition to undergo the procedure.  After obtaining informed consent, the colonoscope                            was passed under direct vision. Throughout the                            procedure, the patient's blood pressure, pulse, and                            oxygen saturations were monitored continuously. The                            Colonoscope was introduced through the anus and                            advanced to the  the cecum, identified by                            transillumination. The colonoscopy was performed                            without difficulty. The patient tolerated the                            procedure well. The quality of the bowel                            preparation was good. The terminal ileum, ileocecal                            valve, appendiceal orifice, and rectum were                            photographed. Scope In: 8:42:50 AM Scope Out: 9:07:14 AM Scope Withdrawal Time: 0 hours 17 minutes 23 seconds  Total Procedure Duration: 0 hours 24 minutes 24 seconds  Findings:                 The perianal and digital rectal examinations were                            normal.                           Three sessile polyps were found in the rectum (2)                            and sigmoid colon. The polyps were 3 to 5 mm in                            size. These polyps were removed with a cold snare.                            Resection and retrieval were complete. Estimated  blood loss was minimal.                           A few small-mouthed diverticula were found in the                            sigmoid colon.                           Normal mucosa was otherwise found in the entire                            colon. Biopsies for histology were taken with a                            cold forceps from the right colon and left colon                            for evaluation of microscopic colitis. Estimated                            blood loss was minimal.                           Non-bleeding internal hemorrhoids were found during                            retroflexion. The hemorrhoids were small and Grade                            I (internal hemorrhoids that do not prolapse).                           The terminal ileum appeared normal. Complications:            No immediate complications. Estimated Blood Loss:     Estimated blood loss  was minimal. Impression:               - Three 3 to 5 mm polyps in the rectum and in the                            sigmoid colon, removed with a cold snare. Resected                            and retrieved.                           - Diverticulosis in the sigmoid colon.                           - Normal mucosa in the entire examined colon.                            Biopsied.                           -  Non-bleeding internal hemorrhoids.                           - The examined portion of the ileum was normal. Recommendation:           - Patient has a contact number available for                            emergencies. The signs and symptoms of potential                            delayed complications were discussed with the                            patient. Return to normal activities tomorrow.                            Written discharge instructions were provided to the                            patient.                           - Resume previous diet.                           - Continue present medications.                           - Await pathology results.                           - Repeat colonoscopy in 3 - 5 years for                            surveillance based on pathology results.                           - Return to GI office PRN.                           - Use fiber, for example Citrucel, Fibercon, Konsyl                            or Metamucil.                           - Internal hemorrhoids were noted on this study and                            may be amenable to hemorrhoid band ligation. If you                            are interested in further treatment of these  hemorrhoids with band ligation, please contact my                            clinic to set up an appointment for evaluation and                            treatment. Gerrit Heck, MD 02/12/2020 9:12:00 AM

## 2020-02-12 NOTE — Progress Notes (Signed)
SP vitals and SS IV.

## 2020-02-16 ENCOUNTER — Telehealth: Payer: Self-pay

## 2020-02-16 NOTE — Telephone Encounter (Signed)
°  Follow up Call-  Call back number 02/12/2020  Post procedure Call Back phone  # [phone]  Permission to leave phone message Yes  Some recent data might be hidden     Patient questions:  Do you have a fever, pain , or abdominal swelling? No. Pain Score  0 *  Have you tolerated food without any problems? Yes.    Have you been able to return to your normal activities? Yes.    Do you have any questions about your discharge instructions: Diet   No. Medications  No. Follow up visit  No.  Do you have questions or concerns about your Care? No.  Actions: * If pain score is 4 or above: 1. No action needed, pain <4.Have you developed a fever since your procedure? no  2.   Have you had an respiratory symptoms (SOB or cough) since your procedure? no  3.   Have you tested positive for COVID 19 since your procedure no  4.   Have you had any family members/close contacts diagnosed with the COVID 19 since your procedure?  no   If yes to any of these questions please route to Joylene John, RN and Joella Prince, RN

## 2020-02-22 ENCOUNTER — Encounter: Payer: Self-pay | Admitting: Family Medicine

## 2020-03-01 DIAGNOSIS — J358 Other chronic diseases of tonsils and adenoids: Secondary | ICD-10-CM | POA: Diagnosis not present

## 2020-03-01 DIAGNOSIS — J3501 Chronic tonsillitis: Secondary | ICD-10-CM | POA: Insufficient documentation

## 2020-03-03 ENCOUNTER — Encounter: Payer: Self-pay | Admitting: Gastroenterology

## 2020-04-04 DIAGNOSIS — Z01818 Encounter for other preprocedural examination: Secondary | ICD-10-CM | POA: Diagnosis not present

## 2020-04-07 ENCOUNTER — Other Ambulatory Visit: Payer: Self-pay | Admitting: Otolaryngology

## 2020-04-07 DIAGNOSIS — J0301 Acute recurrent streptococcal tonsillitis: Secondary | ICD-10-CM | POA: Diagnosis not present

## 2020-04-07 DIAGNOSIS — J351 Hypertrophy of tonsils: Secondary | ICD-10-CM | POA: Diagnosis not present

## 2020-04-07 DIAGNOSIS — J3501 Chronic tonsillitis: Secondary | ICD-10-CM | POA: Diagnosis not present

## 2020-04-12 ENCOUNTER — Encounter: Payer: Self-pay | Admitting: Family Medicine

## 2020-04-12 MED ORDER — FLUCONAZOLE 150 MG PO TABS: 150.0000 mg | ORAL_TABLET | Freq: Once | ORAL | 0 refills | Status: AC

## 2020-05-05 ENCOUNTER — Other Ambulatory Visit: Payer: Self-pay | Admitting: Infectious Diseases

## 2020-05-05 DIAGNOSIS — B009 Herpesviral infection, unspecified: Secondary | ICD-10-CM

## 2020-06-08 DIAGNOSIS — Z20828 Contact with and (suspected) exposure to other viral communicable diseases: Secondary | ICD-10-CM | POA: Diagnosis not present

## 2020-06-08 DIAGNOSIS — Z20822 Contact with and (suspected) exposure to covid-19: Secondary | ICD-10-CM | POA: Diagnosis not present

## 2020-06-11 ENCOUNTER — Other Ambulatory Visit: Payer: Self-pay | Admitting: Infectious Diseases

## 2020-06-11 DIAGNOSIS — B009 Herpesviral infection, unspecified: Secondary | ICD-10-CM

## 2020-06-14 DIAGNOSIS — Z1152 Encounter for screening for COVID-19: Secondary | ICD-10-CM | POA: Diagnosis not present

## 2020-06-29 DIAGNOSIS — M9901 Segmental and somatic dysfunction of cervical region: Secondary | ICD-10-CM | POA: Diagnosis not present

## 2020-06-29 DIAGNOSIS — M9904 Segmental and somatic dysfunction of sacral region: Secondary | ICD-10-CM | POA: Diagnosis not present

## 2020-06-29 DIAGNOSIS — M9902 Segmental and somatic dysfunction of thoracic region: Secondary | ICD-10-CM | POA: Diagnosis not present

## 2020-08-09 DIAGNOSIS — F411 Generalized anxiety disorder: Secondary | ICD-10-CM | POA: Diagnosis not present

## 2020-08-09 DIAGNOSIS — F332 Major depressive disorder, recurrent severe without psychotic features: Secondary | ICD-10-CM | POA: Diagnosis not present

## 2020-08-09 DIAGNOSIS — F4310 Post-traumatic stress disorder, unspecified: Secondary | ICD-10-CM | POA: Diagnosis not present

## 2020-08-15 DIAGNOSIS — M9902 Segmental and somatic dysfunction of thoracic region: Secondary | ICD-10-CM | POA: Diagnosis not present

## 2020-08-15 DIAGNOSIS — M9901 Segmental and somatic dysfunction of cervical region: Secondary | ICD-10-CM | POA: Diagnosis not present

## 2020-08-15 DIAGNOSIS — M9904 Segmental and somatic dysfunction of sacral region: Secondary | ICD-10-CM | POA: Diagnosis not present

## 2020-08-31 DIAGNOSIS — M9901 Segmental and somatic dysfunction of cervical region: Secondary | ICD-10-CM | POA: Diagnosis not present

## 2020-08-31 DIAGNOSIS — M9904 Segmental and somatic dysfunction of sacral region: Secondary | ICD-10-CM | POA: Diagnosis not present

## 2020-08-31 DIAGNOSIS — M9902 Segmental and somatic dysfunction of thoracic region: Secondary | ICD-10-CM | POA: Diagnosis not present

## 2020-09-05 DIAGNOSIS — M9901 Segmental and somatic dysfunction of cervical region: Secondary | ICD-10-CM | POA: Diagnosis not present

## 2020-09-05 DIAGNOSIS — M9904 Segmental and somatic dysfunction of sacral region: Secondary | ICD-10-CM | POA: Diagnosis not present

## 2020-09-05 DIAGNOSIS — M9902 Segmental and somatic dysfunction of thoracic region: Secondary | ICD-10-CM | POA: Diagnosis not present

## 2020-09-06 NOTE — Progress Notes (Signed)
[facility] [address], [place], [place] [address] [phone]  Date:  09/08/2020   Name:  Olivia Reyes   DOB:  07-May-1986   MRN:  [id number]  PCP:  Darreld Mclean, MD    Chief Complaint: Annual Exam (Sees gyn for pap)   History of Present Illness:  Olivia Reyes is a 35 y.o. very pleasant female patient who presents with the following:  Here today for a CPE History of hyst, HSV, obesity  She had a hysterectomy in 2020 due to pelvic pain and prolapse Last seen by myself 6 months ago   Pap - per GYN  covid booster-recommended that she have this done  She had a colonoscopy last year- this did show pre-cancerous polyps.  She is to have a repeat scope in 2-3 years She is not aware of a family history of colon caner Tonsils removed in November- she is very happy with this result, notes that she is sleeping much better  Most recent labs done in September   Previous history of vertigo. She has noted some vertigo again for about 3 weeks- she may have vertigo symptoms for a few seconds, not necessarily associated with movement to suggest BPPV.  It can occur even when she is just seated at her desk looking straight ahead No hearing change or tinnitus She does have headaches, but no change in her pattern   She also has noted left knee pain and a popping/ crepitus sound It bothers her more when she goes up and down stairs She does not as of her left leg seems to be a bit swollen over the last several months    Patient Active Problem List   Diagnosis Date Noted  . Pelvic pain 11/27/2018  . S/P TAH (total abdominal hysterectomy) 11/27/2018  . HSV (herpes simplex virus) infection 02/07/2018  . Tonsil stone 11/05/2017  . Snoring 11/05/2017  . Rash and nonspecific skin eruption 11/15/2015  . Visit for preventive health examination 11/11/2015  . Obesity 11/11/2015  . Shingles rash 05/12/2014  . Anxiety 07/27/2013   . Abnormal Pap smear of cervix 07/27/2013    Past Medical History:  Diagnosis Date  . Anxiety   . Chicken pox   . Complication of anesthesia    Prolonged sedation from anesthesia  . Depression   . Family history of adverse reaction to anesthesia    family has prolonged sedation issues  . HSV infection   . Left ovarian cyst 03/2013   3.8 cm minimally complex left ovarian cyst  . moderate 07/01/2018   right low paraspinal, right upper abdomen  . Obesity   . Pelvic adhesions    Possible  . Pelvic pain   . Shingles 2019  . Strep throat   . Tonsil stone 2019    Past Surgical History:  Procedure Laterality Date  . CHOLECYSTECTOMY  2013  . DILITATION & CURRETTAGE/HYSTROSCOPY WITH HYDROTHERMAL ABLATION N/A 10/31/2018   Procedure: DILATATION & CURETTAGE/HYSTEROSCOPY WITH attempted HYDROTHERMAL ABLATION;  Surgeon: Dian Queen, MD;  Location: [facility];  Service: Gynecology;  Laterality: N/A;  . HAND SURGERY Left 2019  . HYSTERECTOMY ABDOMINAL WITH SALPINGO-OOPHORECTOMY Bilateral 11/27/2018   Procedure: HYSTERECTOMY ABDOMINAL WITH BILATERAL SALPINGECTOMY;  Surgeon: Dian Queen, MD;  Location: [facility];  Service: Gynecology;  Laterality: Bilateral;  . LAPAROSCOPIC TUBAL LIGATION Bilateral 10/31/2018   Procedure: attemted LAPAROSCOPIC TUBAL LIGATION;  Surgeon: Dian Queen, MD;  Location:  [facility];  Service: Gynecology;  Laterality: Bilateral;  . WISDOM TOOTH EXTRACTION    . WOUND EXPLORATION Left 09/12/2017   Procedure: LEFT HAND EXPLORATION;  Surgeon: Leanora Cover, MD;  Location: [facility];  Service: Orthopedics;  Laterality: Left;    Social History   Tobacco Use  . Smoking status: Former Smoker    Quit date: 07/27/2012    Years since quitting: 8.1  . Smokeless tobacco: Never Used  Vaping Use  . Vaping Use: Never used  Substance Use Topics  . Alcohol use: Yes    Alcohol/week: 1.0 standard drink    Types: 1 Standard  drinks or equivalent per week    Comment: rarely  . Drug use: No    Family History  Problem Relation Age of Onset  . Hypertension Mother   . Arthritis Maternal Grandmother   . Hyperlipidemia Maternal Grandmother   . Hypertension Maternal Grandmother   . Throat cancer Maternal Grandfather   . Stroke Maternal Aunt   . Healthy Sister        x1  . Healthy Daughter        x1  . Colon cancer Neg Hx   . Rectal cancer Neg Hx     No Known Allergies  Medication list has been reviewed and updated.  Current Outpatient Medications on File Prior to Visit  Medication Sig Dispense Refill  . buPROPion (WELLBUTRIN XL) 150 MG 24 hr tablet Take 150 mg by mouth daily.    . celecoxib (CELEBREX) 200 MG capsule Take 200 mg by mouth daily.    . citalopram (CELEXA) 20 MG tablet Take 30 mg by mouth at bedtime.     . clonazePAM (KLONOPIN) 0.5 MG tablet Take 0.5 mg by mouth 2 (two) times daily as needed for anxiety.    . triamterene-hydrochlorothiazide (MAXZIDE) 75-50 MG tablet Take 1 tablet by mouth daily.    . valACYclovir (VALTREX) 1000 MG tablet TAKE 1/2 TABLET (500 MG TOTAL) BY MOUTH DAILY. 45 tablet 7   No current facility-administered medications on file prior to visit.    Review of Systems:  As per HPI- otherwise negative.   Physical Examination: Vitals:   09/08/20 0901  BP: 126/82  Pulse: 72  Resp: 15  Temp: 97.9 F (36.6 C)  SpO2: 98%   Vitals:   09/08/20 0901  Weight: 225 lb (102.1 kg)  Height: 5\' 11"  (1.803 m)   Body mass index is 31.38 kg/m. Ideal Body Weight: Weight in (lb) to have BMI = 25: 178.9  GEN: no acute distress.  Obese, otherwise looks well HEENT: Atraumatic, Normocephalic.  Ears and Nose: No external deformity. CV: RRR, No M/G/R. No JVD. No thrill. No extra heart sounds. PULM: CTA B, no wheezes, crackles, rhonchi. No retractions. No resp. distress. No accessory muscle use. ABD: S, NT, ND, +BS. No rebound. No HSM. EXTR: No c/c/ Trace edema of the left  lower extremity is noted.  Left knee displays some crepitus but normal stability PSYCH: Normally interactive. Conversant.  Normal neuro exam including bilateral strength and sensation of upper and lower extremities, normal deep tendon reflex, negative Romberg, normal facial movement  Assessment and Plan: Physical exam  Vertigo - Plan: Basic metabolic panel, Ambulatory referral to Neurology  Chronic pain of left knee - Plan: DG Knee Complete 4 Views Left, US Venous Img Lower Unilateral Left  Dyslipidemia - Plan: Lipid panel  Here today for physical exam.  We will check lipids today She also notes some  vertigo symptoms, not really typical of BPPV.  I have placed referral to neurology for further evaluation-she will let me know if any changes or worsening in the meantime  Discussed her knee pain.  For the time being her symptoms are manageable, she is not interested in pursuing an MRI.  We will obtain plain films to determine if any arthritic component, also ultrasound left lower extremity rule out DVT  This visit occurred during the SARS-CoV-2 public health emergency.  Safety protocols were in place, including screening questions prior to the visit, additional usage of staff PPE, and extensive cleaning of exam room while observing appropriate contact time as indicated for disinfecting solutions.    Signed Lamar Blinks, MD  Received the reports as below, message to patient  US Venous Img Lower Unilateral Left  Result Date: 09/08/2020 CLINICAL DATA:  Left lower extremity pain and edema. Evaluate for DVT. EXAM: LEFT LOWER EXTREMITY VENOUS DOPPLER ULTRASOUND TECHNIQUE: Gray-scale sonography with graded compression, as well as color Doppler and duplex ultrasound were performed to evaluate the lower extremity deep venous systems from the level of the common femoral vein and including the common femoral, femoral, profunda femoral, popliteal and calf veins including the posterior tibial,  peroneal and gastrocnemius veins when visible. The superficial great saphenous vein was also interrogated. Spectral Doppler was utilized to evaluate flow at rest and with distal augmentation maneuvers in the common femoral, femoral and popliteal veins. COMPARISON:  None. FINDINGS: Contralateral Common Femoral Vein: Respiratory phasicity is normal and symmetric with the symptomatic side. No evidence of thrombus. Normal compressibility. Common Femoral Vein: No evidence of thrombus. Normal compressibility, respiratory phasicity and response to augmentation. Saphenofemoral Junction: No evidence of thrombus. Normal compressibility and flow on color Doppler imaging. Profunda Femoral Vein: No evidence of thrombus. Normal compressibility and flow on color Doppler imaging. Femoral Vein: No evidence of thrombus. Normal compressibility, respiratory phasicity and response to augmentation. Popliteal Vein: No evidence of thrombus. Normal compressibility, respiratory phasicity and response to augmentation. Calf Veins: No evidence of thrombus. Normal compressibility and flow on color Doppler imaging. Superficial Great Saphenous Vein: No evidence of thrombus. Normal compressibility. Venous Reflux:  None. Other Findings:  None. IMPRESSION: No evidence of DVT within the left lower extremity. Electronically Signed   By: Sandi Mariscal M.D.   On: 09/08/2020 10:03   DG Knee Complete 4 Views Left  Result Date: 09/08/2020 CLINICAL DATA:  Chronic left knee pain.  No recent injury. EXAM: LEFT KNEE - COMPLETE 4+ VIEW COMPARISON:  None. FINDINGS: No fracture or dislocation. Left knee joint spaces appear preserved. No joint effusion. No evidence of chondrocalcinosis. Regional soft tissues appear normal. IMPRESSION: Normal radiographs of the left knee. Electronically Signed   By: Sandi Mariscal M.D.   On: 09/08/2020 10:04

## 2020-09-06 NOTE — Patient Instructions (Signed)
Good to see you again today- I will check your electrolytes and lipids for you today And omega 3 supplement may be helpful to raise your HDL/ good cholesterol and improve your overall profile Please stop by imaging on the way out today and get an x-ray of your left knee and hopefully also ultrasound of the left leg.  They may have to schedule the [place] later on   I will put in a non- urgent neurology referral for you to discuss your vertigo- please let me know if this is changing or getting worse    Health Maintenance, Female Adopting a healthy lifestyle and getting preventive care are important in promoting health and wellness. Ask your health care provider about:  The right schedule for you to have regular tests and exams.  Things you can do on your own to prevent diseases and keep yourself healthy. What should I know about diet, weight, and exercise? Eat a healthy diet  Eat a diet that includes plenty of vegetables, fruits, low-fat dairy products, and lean protein.  Do not eat a lot of foods that are high in solid fats, added sugars, or sodium.   Maintain a healthy weight Body mass index (BMI) is used to identify weight problems. It estimates body fat based on height and weight. Your health care provider can help determine your BMI and help you achieve or maintain a healthy weight. Get regular exercise Get regular exercise. This is one of the most important things you can do for your health. Most adults should:  Exercise for at least 150 minutes each week. The exercise should increase your heart rate and make you sweat (moderate-intensity exercise).  Do strengthening exercises at least twice a week. This is in addition to the moderate-intensity exercise.  Spend less time sitting. Even light physical activity can be beneficial. Watch cholesterol and blood lipids Have your blood tested for lipids and cholesterol at 34 years of age, then have this test every 5 years. Have your cholesterol  levels checked more often if:  Your lipid or cholesterol levels are high.  You are older than 34 years of age.  You are at high risk for heart disease. What should I know about cancer screening? Depending on your health history and family history, you may need to have cancer screening at various ages. This may include screening for:  Breast cancer.  Cervical cancer.  Colorectal cancer.  Skin cancer.  Lung cancer. What should I know about heart disease, diabetes, and high blood pressure? Blood pressure and heart disease  High blood pressure causes heart disease and increases the risk of stroke. This is more likely to develop in people who have high blood pressure readings, are of African descent, or are overweight.  Have your blood pressure checked: ? Every 3-5 years if you are 15-61 years of age. ? Every year if you are 65 years old or older. Diabetes Have regular diabetes screenings. This checks your fasting blood sugar level. Have the screening done:  Once every three years after age 77 if you are at a normal weight and have a low risk for diabetes.  More often and at a younger age if you are overweight or have a high risk for diabetes. What should I know about preventing infection? Hepatitis B If you have a higher risk for hepatitis B, you should be screened for this virus. Talk with your health care provider to find out if you are at risk for hepatitis B infection. Hepatitis  C Testing is recommended for:  Everyone born from [birth year] through 1964.  Anyone with known risk factors for hepatitis C. Sexually transmitted infections (STIs)  Get screened for STIs, including gonorrhea and chlamydia, if: ? You are sexually active and are younger than 34 years of age. ? You are older than 34 years of age and your health care provider tells you that you are at risk for this type of infection. ? Your sexual activity has changed since you were last screened, and you are at increased  risk for chlamydia or gonorrhea. Ask your health care provider if you are at risk.  Ask your health care provider about whether you are at high risk for HIV. Your health care provider may recommend a prescription medicine to help prevent HIV infection. If you choose to take medicine to prevent HIV, you should first get tested for HIV. You should then be tested every 3 months for as long as you are taking the medicine. Pregnancy  If you are about to stop having your period (premenopausal) and you may become pregnant, seek counseling before you get pregnant.  Take 400 to 800 micrograms (mcg) of folic acid every day if you become pregnant.  Ask for birth control (contraception) if you want to prevent pregnancy. Osteoporosis and menopause Osteoporosis is a disease in which the bones lose minerals and strength with aging. This can result in bone fractures. If you are 67 years old or older, or if you are at risk for osteoporosis and fractures, ask your health care provider if you should:  Be screened for bone loss.  Take a calcium or vitamin D supplement to lower your risk of fractures.  Be given hormone replacement therapy (HRT) to treat symptoms of menopause. Follow these instructions at home: Lifestyle  Do not use any products that contain nicotine or tobacco, such as cigarettes, e-cigarettes, and chewing tobacco. If you need help quitting, ask your health care provider.  Do not use street drugs.  Do not share needles.  Ask your health care provider for help if you need support or information about quitting drugs. Alcohol use  Do not drink alcohol if: ? Your health care provider tells you not to drink. ? You are pregnant, may be pregnant, or are planning to become pregnant.  If you drink alcohol: ? Limit how much you use to 0-1 drink a day. ? Limit intake if you are breastfeeding.  Be aware of how much alcohol is in your drink. In the [place] one drink equals one 12 oz bottle of beer  (355 mL), one 5 oz glass of wine (148 mL), or one 1 oz glass of hard liquor (44 mL). General instructions  Schedule regular health, dental, and eye exams.  Stay current with your vaccines.  Tell your health care provider if: ? You often feel depressed. ? You have ever been abused or do not feel safe at home. Summary  Adopting a healthy lifestyle and getting preventive care are important in promoting health and wellness.  Follow your health care provider's instructions about healthy diet, exercising, and getting tested or screened for diseases.  Follow your health care provider's instructions on monitoring your cholesterol and blood pressure. This information is not intended to replace advice given to you by your health care provider. Make sure you discuss any questions you have with your health care provider. Document Revised: 05/07/2018 Document Reviewed: 05/07/2018 Elsevier Patient Education  2021 [employer].

## 2020-09-07 ENCOUNTER — Other Ambulatory Visit: Payer: Self-pay

## 2020-09-08 ENCOUNTER — Other Ambulatory Visit: Payer: Self-pay

## 2020-09-08 ENCOUNTER — Ambulatory Visit (HOSPITAL_BASED_OUTPATIENT_CLINIC_OR_DEPARTMENT_OTHER)
Admission: RE | Admit: 2020-09-08 | Discharge: 2020-09-08 | Disposition: A | Discharge status: Home / Self Care | Payer: BC Managed Care – PPO | Source: Ambulatory Visit | Attending: Family Medicine | Admitting: Family Medicine

## 2020-09-08 ENCOUNTER — Encounter: Payer: Self-pay | Admitting: Family Medicine

## 2020-09-08 ENCOUNTER — Ambulatory Visit (INDEPENDENT_AMBULATORY_CARE_PROVIDER_SITE_OTHER): Payer: BC Managed Care – PPO | Admitting: Family Medicine

## 2020-09-08 VITALS — BP 126/82 | HR 72 | Temp 97.9°F | Resp 15 | Ht 71.0 in | Wt 225.0 lb

## 2020-09-08 DIAGNOSIS — M25562 Pain in left knee: Secondary | ICD-10-CM | POA: Insufficient documentation

## 2020-09-08 DIAGNOSIS — R42 Dizziness and giddiness: Secondary | ICD-10-CM

## 2020-09-08 DIAGNOSIS — M79662 Pain in left lower leg: Secondary | ICD-10-CM | POA: Diagnosis not present

## 2020-09-08 DIAGNOSIS — Z0000 Encounter for general adult medical examination without abnormal findings: Secondary | ICD-10-CM | POA: Diagnosis not present

## 2020-09-08 DIAGNOSIS — G8929 Other chronic pain: Secondary | ICD-10-CM

## 2020-09-08 DIAGNOSIS — R600 Localized edema: Secondary | ICD-10-CM | POA: Diagnosis not present

## 2020-09-08 DIAGNOSIS — E785 Hyperlipidemia, unspecified: Secondary | ICD-10-CM | POA: Diagnosis not present

## 2020-09-08 DIAGNOSIS — M79605 Pain in left leg: Secondary | ICD-10-CM | POA: Diagnosis not present

## 2020-09-10 ENCOUNTER — Ambulatory Visit (HOSPITAL_BASED_OUTPATIENT_CLINIC_OR_DEPARTMENT_OTHER): Payer: BC Managed Care – PPO

## 2020-09-14 ENCOUNTER — Ambulatory Visit: Payer: BC Managed Care – PPO | Admitting: Family Medicine

## 2020-09-14 ENCOUNTER — Other Ambulatory Visit: Payer: Self-pay

## 2020-09-14 ENCOUNTER — Encounter: Payer: Self-pay | Admitting: Family Medicine

## 2020-09-14 DIAGNOSIS — M2242 Chondromalacia patellae, left knee: Secondary | ICD-10-CM

## 2020-09-14 DIAGNOSIS — M25562 Pain in left knee: Secondary | ICD-10-CM

## 2020-09-14 DIAGNOSIS — N6019 Diffuse cystic mastopathy of unspecified breast: Secondary | ICD-10-CM | POA: Insufficient documentation

## 2020-09-14 DIAGNOSIS — G8929 Other chronic pain: Secondary | ICD-10-CM | POA: Diagnosis not present

## 2020-09-14 NOTE — Progress Notes (Signed)
I saw and examined the patient with Dr. Elouise Munroe and agree with assessment and plan as outlined.    Left knee chondromalacia patella, minimally symptomatic.  We will try glucosamine, turmeric, avoid sugar.  Home exercises for strengthening.  Patella strap brace during workouts.  If symptoms worsen, then MRI scan.  If MRI is negative, could contemplate gel injections.

## 2020-09-14 NOTE — Progress Notes (Signed)
Office Visit Note   Patient: Olivia Reyes           Date of Birth: 06/22/1985           MRN: [id number] Visit Date: 09/14/2020 Requested by: Darreld Mclean, MD [address] [place],  [place] [address] PCP: Darreld Mclean, MD  Subjective: Chief Complaint  Patient presents with  . Left Knee - Pain    Crunching in the knee with going up stairs, along with pain in the anterior knee x couple months. Has also had swelling in the left lower leg, from the knee down. Xrays and a doppler have already been performed. NKI    HPI: 35yo F presenting to clinic with concerns of chronic left knee 'crunching' and pain with going up and down stairs. Patient states that she used to play Tennis in high school, but denies any trauma to the knee. She says she is scared to be too active in the gym, as she doesn't want to hurt her knee, but will occasionally get on the elliptical, which she tolerates well. Does state that sometimes when she is kneeling she will feel like her knee will 'catch' and take awhile to extend, though this is not common. She often feels as though her knee will swell all the way down to her ankle. Her PCM recently ordered an Ultrasound to rule out clots as a cause for this. She says when her pain is bad she will take ibuprofen, though this isn't a common occurrence.               ROS:   All other systems were reviewed and are negative.  Objective: Vital Signs: LMP 06/19/2013   Physical Exam:  General:  Alert and oriented, in no acute distress. Pulm:  Breathing unlabored. Psy:  Normal mood, congruent affect. Skin:  Bilateral knees with no bruising, rashes, or erythema. Overlying skin intact.   LEFT KNEE EXAM:  General: Normal gait Standing exam: No varus or valgus deformity of the knee. Very mild overpronation with standing.   Seated Exam:  Mild patellar crepitus on left, none on right. Negative J-Sign.   Palpation: No tenderness to palpation over medial or  lateral joint lines. No tenderness with palpation of patella or patellar tendon. No tenderness over patellar facets.   Supine exam: No effusion, normal patellar mobility.   Ligamentous Exam:  No pain or laxity with anterior/posterior drawer.  No obvious Sag.  No pain, though does have pseudolaxity with varus/valgus stress across the knee. This is symmetric bilaterally.   Meniscus:  McMurray with no pain or deep clicking.  Thessaly negative.   Strength: Hip flexion (L1), Hip Aduction (L2), Knee Extension (L3) are 5/5 Bilaterally Foot Inversion (L4), Dorsiflexion (L5), and Eversion (S1) 5/5 Bilaterally  Sensation: Intact to light touch medial and lateral aspects of lower extremities, and lateral, dorsal, and medial aspects of foot.    Imaging: CLINICAL DATA:  Chronic left knee pain.  No recent injury.  EXAM: LEFT KNEE - COMPLETE 4+ VIEW  COMPARISON:  None.  FINDINGS: No fracture or dislocation. Left knee joint spaces appear preserved. No joint effusion. No evidence of chondrocalcinosis. Regional soft tissues appear normal.  IMPRESSION: Normal radiographs of the left knee.   Electronically Signed   By: Sandi Mariscal M.D.   On: 09/08/2020 10:04  Assessment & Plan: 35yo F presenting to clinic with concerns of chronic left anterior knee pain, accompanied by patellar crepitus. 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  - Recommended antiinflammatory diet to help reduce inflammation within the joint - Discussed avoiding deep squatting activities that increase pressure through the patellofemoral joint - Provided with HEP to help strengthen the knee - If no improvement with above, would consider formal PT referral - Patient expresses understanding with plan. She has no further questions or concerns today.      Procedures: No procedures performed        PMFS History: Patient Active Problem List   Diagnosis Date Noted  . Fibrocystic  breast disease (FCBD) 09/14/2020  . Chronic tonsillitis 03/01/2020  . Dysthymic disorder 10/26/2019  . Pelvic pain 11/27/2018  . S/P TAH (total abdominal hysterectomy) 11/27/2018  . HSV (herpes simplex virus) infection 02/07/2018  . Tonsil stone 11/05/2017  . Snoring 11/05/2017  . Rash and nonspecific skin eruption 11/15/2015  . Visit for preventive health examination 11/11/2015  . Obesity 11/11/2015  . Shingles rash 05/12/2014  . Anxiety 07/27/2013  . Abnormal Pap smear of cervix 07/27/2013   Past Medical History:  Diagnosis Date  . Anxiety   . Chicken pox   . Complication of anesthesia    Prolonged sedation from anesthesia  . Depression   . Family history of adverse reaction to anesthesia    family has prolonged sedation issues  . HSV infection   . Left ovarian cyst 03/2013   3.8 cm minimally complex left ovarian cyst  . moderate 07/01/2018   right low paraspinal, right upper abdomen  . Obesity   . Pelvic adhesions    Possible  . Pelvic pain   . Shingles 2019  . Strep throat   . Tonsil stone 2019    Family History  Problem Relation Age of Onset  . Hypertension Mother   . Arthritis Maternal Grandmother   . Hyperlipidemia Maternal Grandmother   . Hypertension Maternal Grandmother   . Throat cancer Maternal Grandfather   . Stroke Maternal Aunt   . Healthy Sister        x1  . Healthy Daughter        x1  . Colon cancer Neg Hx   . Rectal cancer Neg Hx     Past Surgical History:  Procedure Laterality Date  . CHOLECYSTECTOMY  2013  . DILITATION & CURRETTAGE/HYSTROSCOPY WITH HYDROTHERMAL ABLATION N/A 10/31/2018   Procedure: DILATATION & CURETTAGE/HYSTEROSCOPY WITH attempted HYDROTHERMAL ABLATION;  Surgeon: Dian Queen, MD;  Location: [facility];  Service: Gynecology;  Laterality: N/A;  . HAND SURGERY Left 2019  . HYSTERECTOMY ABDOMINAL WITH SALPINGO-OOPHORECTOMY Bilateral 11/27/2018   Procedure: HYSTERECTOMY ABDOMINAL WITH BILATERAL SALPINGECTOMY;   Surgeon: Dian Queen, MD;  Location: [facility];  Service: Gynecology;  Laterality: Bilateral;  . LAPAROSCOPIC TUBAL LIGATION Bilateral 10/31/2018   Procedure: attemted LAPAROSCOPIC TUBAL LIGATION;  Surgeon: Dian Queen, MD;  Location: [facility];  Service: Gynecology;  Laterality: Bilateral;  . WISDOM TOOTH EXTRACTION    . WOUND EXPLORATION Left 09/12/2017   Procedure: LEFT HAND EXPLORATION;  Surgeon: Leanora Cover, MD;  Location: [facility];  Service: Orthopedics;  Laterality: Left;   Social History   Occupational History  . Not on file  Tobacco Use  . Smoking status: Former Smoker    Quit date: 07/27/2012    Years since quitting: 8.1  . Smokeless tobacco: Never Used  Vaping Use  . Vaping Use: Never used  Substance and Sexual Activity  . Alcohol use: Yes    Alcohol/week: 1.0 standard  drink    Types: 1 Standard drinks or equivalent per week    Comment: rarely  . Drug use: No  . Sexual activity: Yes    Birth control/protection: None    Comment: men

## 2020-09-14 NOTE — Patient Instructions (Signed)
     Glucosamine Sulfate:  1,000 mg twice daily  Turmeric:  500 mg twice daily  Diet:  Minimize sugar intake.

## 2020-10-18 DIAGNOSIS — M9902 Segmental and somatic dysfunction of thoracic region: Secondary | ICD-10-CM | POA: Diagnosis not present

## 2020-10-18 DIAGNOSIS — M9901 Segmental and somatic dysfunction of cervical region: Secondary | ICD-10-CM | POA: Diagnosis not present

## 2020-10-18 DIAGNOSIS — M9904 Segmental and somatic dysfunction of sacral region: Secondary | ICD-10-CM | POA: Diagnosis not present

## 2020-11-02 DIAGNOSIS — M9902 Segmental and somatic dysfunction of thoracic region: Secondary | ICD-10-CM | POA: Diagnosis not present

## 2020-11-02 DIAGNOSIS — M9901 Segmental and somatic dysfunction of cervical region: Secondary | ICD-10-CM | POA: Diagnosis not present

## 2020-11-02 DIAGNOSIS — M9904 Segmental and somatic dysfunction of sacral region: Secondary | ICD-10-CM | POA: Diagnosis not present

## 2020-11-18 ENCOUNTER — Encounter: Payer: Self-pay | Admitting: Family

## 2020-11-18 ENCOUNTER — Ambulatory Visit: Payer: BC Managed Care – PPO | Admitting: Family

## 2020-11-18 ENCOUNTER — Other Ambulatory Visit: Payer: Self-pay

## 2020-11-18 VITALS — BP 119/66 | HR 72 | Temp 99.2°F | Resp 16 | Wt 218.0 lb

## 2020-11-18 DIAGNOSIS — J069 Acute upper respiratory infection, unspecified: Secondary | ICD-10-CM | POA: Diagnosis not present

## 2020-11-18 DIAGNOSIS — J029 Acute pharyngitis, unspecified: Secondary | ICD-10-CM | POA: Diagnosis not present

## 2020-11-18 LAB — POCT RAPID STREP A (OFFICE): Rapid Strep A Screen: NEGATIVE

## 2020-11-18 NOTE — Patient Instructions (Addendum)
Please add sudafed and flonase for nasal congestion/ear pressure. You may also add claritin once a day.  For pain you may use tylenol or motrin as needed. Call if new/worsening symptoms or if symptoms are not improved in 3-4 days.

## 2020-11-18 NOTE — Progress Notes (Incomplete)
Subjective:   By signing my name below, I, [name], attest that this documentation has been prepared under the direction and in the presence of [name]. 11/18/2020     Patient ID: Olivia Reyes, female    DOB: 1986-03-17, 35 y.o.   MRN: [id number]  No chief complaint on file.   HPI Patient is in today for a office visit. She complains of   Health Maintenance Due  Topic Date Due   PAP SMEAR-Modifier  07/27/2018   COVID-19 Vaccine (3 - Booster) 03/07/2020    Past Medical History:  Diagnosis Date   Anxiety    Chicken pox    Complication of anesthesia    Prolonged sedation from anesthesia   Depression    Family history of adverse reaction to anesthesia    family has prolonged sedation issues   HSV infection    Left ovarian cyst 03/2013   3.8 cm minimally complex left ovarian cyst   moderate 07/01/2018   right low paraspinal, right upper abdomen   Obesity    Pelvic adhesions    Possible   Pelvic pain    Shingles 2019   Strep throat    Tonsil stone 2019    Past Surgical History:  Procedure Laterality Date   CHOLECYSTECTOMY  2013   DILITATION & CURRETTAGE/HYSTROSCOPY WITH HYDROTHERMAL ABLATION N/A 10/31/2018   Procedure: DILATATION & CURETTAGE/HYSTEROSCOPY WITH attempted HYDROTHERMAL ABLATION;  Surgeon: Dian Queen, MD;  Location: [facility];  Service: Gynecology;  Laterality: N/A;   HAND SURGERY Left 2019   HYSTERECTOMY ABDOMINAL WITH SALPINGO-OOPHORECTOMY Bilateral 11/27/2018   Procedure: HYSTERECTOMY ABDOMINAL WITH BILATERAL SALPINGECTOMY;  Surgeon: Dian Queen, MD;  Location: [facility];  Service: Gynecology;  Laterality: Bilateral;   LAPAROSCOPIC TUBAL LIGATION Bilateral 10/31/2018   Procedure: attemted LAPAROSCOPIC TUBAL LIGATION;  Surgeon: Dian Queen, MD;  Location: [facility];  Service: Gynecology;  Laterality: Bilateral;   WISDOM TOOTH EXTRACTION     WOUND EXPLORATION Left 09/12/2017   Procedure: LEFT  HAND EXPLORATION;  Surgeon: Leanora Cover, MD;  Location: [facility];  Service: Orthopedics;  Laterality: Left;    Family History  Problem Relation Age of Onset   Hypertension Mother    Arthritis Maternal Grandmother    Hyperlipidemia Maternal Grandmother    Hypertension Maternal Grandmother    Throat cancer Maternal Grandfather    Stroke Maternal Aunt    Healthy Sister        x1   Healthy Daughter        x1   Colon cancer Neg Hx    Rectal cancer Neg Hx     Social History   Socioeconomic History   Marital status: Married    Spouse name: Not on file   Number of children: Not on file   Years of education: Not on file   Highest education level: Not on file  Occupational History   Not on file  Tobacco Use   Smoking status: Former    Pack years: 0.00    Types: Cigarettes    Quit date: 07/27/2012    Years since quitting: 8.3   Smokeless tobacco: Never  Vaping Use   Vaping Use: Never used  Substance and Sexual Activity   Alcohol use: Yes    Alcohol/week: 1.0 standard drink    Types: 1 Standard drinks or equivalent per week    Comment: rarely   Drug use: No   Sexual activity: Yes    Birth control/protection: None  Comment: men  Other Topics Concern   Not on file  Social History Narrative   Not on file   Social Determinants of Health   Financial Resource Strain: Not on file  Food Insecurity: Not on file  Transportation Needs: Not on file  Physical Activity: Not on file  Stress: Not on file  Social Connections: Not on file  Intimate Partner Violence: Not on file    Outpatient Medications Prior to Visit  Medication Sig Dispense Refill   buPROPion (WELLBUTRIN XL) 150 MG 24 hr tablet Take 150 mg by mouth daily.     celecoxib (CELEBREX) 200 MG capsule Take 200 mg by mouth daily.     citalopram (CELEXA) 20 MG tablet Take 30 mg by mouth at bedtime.      clonazePAM (KLONOPIN) 0.5 MG tablet Take 0.5 mg by mouth 2 (two) times daily as needed for anxiety.      triamterene-hydrochlorothiazide (MAXZIDE) 75-50 MG tablet Take 1 tablet by mouth daily.     valACYclovir (VALTREX) 1000 MG tablet TAKE 1/2 TABLET (500 MG TOTAL) BY MOUTH DAILY. 45 tablet 7   No facility-administered medications prior to visit.    No Known Allergies  ROS     Objective:    Physical Exam Constitutional:      General: She is not in acute distress.    Appearance: Normal appearance. She is not ill-appearing.  HENT:     Head: Normocephalic and atraumatic.     Right Ear: External ear normal.     Left Ear: External ear normal.  Eyes:     Extraocular Movements: Extraocular movements intact.     Pupils: Pupils are equal, round, and reactive to light.  Cardiovascular:     Rate and Rhythm: Normal rate and regular rhythm.     Pulses: Normal pulses.     Heart sounds: Normal heart sounds. No murmur heard.   No gallop.  Pulmonary:     Effort: Pulmonary effort is normal. No respiratory distress.     Breath sounds: Normal breath sounds. No wheezing, rhonchi or rales.  Skin:    General: Skin is warm and dry.  Neurological:     Mental Status: She is alert and oriented to person, place, and time.  Psychiatric:        Behavior: Behavior normal.    LMP 06/19/2013  Wt Readings from Last 3 Encounters:  09/08/20 225 lb (102.1 kg)  02/12/20 224 lb (101.6 kg)  02/10/20 218 lb (98.9 kg)       Assessment & Plan:   Problem List Items Addressed This Visit   None    No orders of the defined types were placed in this encounter.   I, [name], personally preformed the services described in this documentation.  All medical record entries made by the scribe were at my direction and in my presence.  I have reviewed the chart and discharge instructions (if applicable) and agree that the record reflects my personal performance and is accurate and complete. 11/18/2020   I,[name],acting as a Education administrator for Nance Pear, NP.,have documented all relevant  documentation on the behalf of Nance Pear, NP,as directed by  Nance Pear, NP while in the presence of Nance Pear, NP.   Shehryar [employer]

## 2020-11-18 NOTE — Assessment & Plan Note (Signed)
Rapid strep is negative.  3 days of symptoms.  No sign of acute OM. I suspect some eustachian tube dysfunction on the left in the setting of URI. Discussed the following recommendations with patient:  Please add sudafed and flonase for nasal congestion/ear pressure. You may also add claritin once a day.  For pain you may use tylenol or motrin as needed. Call if new/worsening symptoms or if symptoms are not improved in 3-4 days.

## 2020-11-18 NOTE — Progress Notes (Signed)
Subjective:   By signing my name below, I, Olivia Reyes, attest that this documentation has been prepared under the direction and in the presence of [name]. 11/18/2020    Patient ID: Olivia Reyes, female    DOB: Sep 26, 1985, 35 y.o.   MRN: [id number]  Chief Complaint  Patient presents with   Otalgia    Complains of bilateral ear pain, more on the left with left sided neck pain   Facial Pain    Complains of sinus congestion and pain    HPI Patient is in today for a office visit. She complains of earache in the left ear and headache for the past 3 days. The day after she first experienced the symptoms she started getting sinus congestion. Since then her headaches have decreased but her earache and congestions are still present. She also notes that while laying down she feels like liquid is in her ear.  She also complains of sore throat. She has a history of having her tonsils removed. Sore throat is worse on the left, but also notes more post-nasal drainage on the left.   Health Maintenance Due  Topic Date Due   PAP SMEAR-Modifier  07/27/2018   COVID-19 Vaccine (3 - Booster) 03/07/2020    Past Medical History:  Diagnosis Date   Anxiety    Chicken pox    Complication of anesthesia    Prolonged sedation from anesthesia   Depression    Family history of adverse reaction to anesthesia    family has prolonged sedation issues   HSV infection    Left ovarian cyst 03/2013   3.8 cm minimally complex left ovarian cyst   moderate 07/01/2018   right low paraspinal, right upper abdomen   Obesity    Pelvic adhesions    Possible   Pelvic pain    Shingles 2019   Strep throat    Tonsil stone 2019    Past Surgical History:  Procedure Laterality Date   CHOLECYSTECTOMY  2013   DILITATION & CURRETTAGE/HYSTROSCOPY WITH HYDROTHERMAL ABLATION N/A 10/31/2018   Procedure: DILATATION & CURETTAGE/HYSTEROSCOPY WITH attempted HYDROTHERMAL ABLATION;  Surgeon: Dian Queen, MD;   Location: [facility];  Service: Gynecology;  Laterality: N/A;   HAND SURGERY Left 2019   HYSTERECTOMY ABDOMINAL WITH SALPINGO-OOPHORECTOMY Bilateral 11/27/2018   Procedure: HYSTERECTOMY ABDOMINAL WITH BILATERAL SALPINGECTOMY;  Surgeon: Dian Queen, MD;  Location: [facility];  Service: Gynecology;  Laterality: Bilateral;   LAPAROSCOPIC TUBAL LIGATION Bilateral 10/31/2018   Procedure: attemted LAPAROSCOPIC TUBAL LIGATION;  Surgeon: Dian Queen, MD;  Location: [facility];  Service: Gynecology;  Laterality: Bilateral;   WISDOM TOOTH EXTRACTION     WOUND EXPLORATION Left 09/12/2017   Procedure: LEFT HAND EXPLORATION;  Surgeon: Leanora Cover, MD;  Location: [facility];  Service: Orthopedics;  Laterality: Left;    Family History  Problem Relation Age of Onset   Hypertension Mother    Arthritis Maternal Grandmother    Hyperlipidemia Maternal Grandmother    Hypertension Maternal Grandmother    Throat cancer Maternal Grandfather    Stroke Maternal Aunt    Healthy Sister        x1   Healthy Daughter        x1   Colon cancer Neg Hx    Rectal cancer Neg Hx     Social History   Socioeconomic History   Marital status: Married    Spouse name: Not on file   Number of children: Not on  file   Years of education: Not on file   Highest education level: Not on file  Occupational History   Not on file  Tobacco Use   Smoking status: Former    Pack years: 0.00    Types: Cigarettes    Quit date: 07/27/2012    Years since quitting: 8.3   Smokeless tobacco: Never  Vaping Use   Vaping Use: Never used  Substance and Sexual Activity   Alcohol use: Yes    Alcohol/week: 1.0 standard drink    Types: 1 Standard drinks or equivalent per week    Comment: rarely   Drug use: No   Sexual activity: Yes    Birth control/protection: None    Comment: men  Other Topics Concern   Not on file  Social History Narrative   Not on file   Social Determinants of  Health   Financial Resource Strain: Not on file  Food Insecurity: Not on file  Transportation Needs: Not on file  Physical Activity: Not on file  Stress: Not on file  Social Connections: Not on file  Intimate Partner Violence: Not on file    Outpatient Medications Prior to Visit  Medication Sig Dispense Refill   buPROPion (WELLBUTRIN XL) 150 MG 24 hr tablet Take 150 mg by mouth daily.     celecoxib (CELEBREX) 200 MG capsule Take 200 mg by mouth daily.     citalopram (CELEXA) 20 MG tablet Take 30 mg by mouth at bedtime.      clonazePAM (KLONOPIN) 0.5 MG tablet Take 0.5 mg by mouth 2 (two) times daily as needed for anxiety.     triamterene-hydrochlorothiazide (MAXZIDE) 75-50 MG tablet Take 1 tablet by mouth daily.     valACYclovir (VALTREX) 1000 MG tablet TAKE 1/2 TABLET (500 MG TOTAL) BY MOUTH DAILY. 45 tablet 7   No facility-administered medications prior to visit.    No Known Allergies  Review of Systems  HENT:  Positive for congestion, ear pain (Left ear) and sore throat.       Objective:    Physical Exam Constitutional:      General: She is not in acute distress.    Appearance: Normal appearance. She is not ill-appearing.  HENT:     Head: Normocephalic and atraumatic.     Right Ear: Tympanic membrane, ear canal and external ear normal.     Left Ear: Tympanic membrane is not erythematous or bulging.     Ears:     Comments: Left TM has clear fluid behind the membrane    Mouth/Throat:     Pharynx: Posterior oropharyngeal erythema (mild) present.  Eyes:     Extraocular Movements: Extraocular movements intact.     Pupils: Pupils are equal, round, and reactive to light.  Cardiovascular:     Rate and Rhythm: Normal rate and regular rhythm.     Pulses: Normal pulses.     Heart sounds: Normal heart sounds. No murmur heard.   No gallop.  Pulmonary:     Effort: Pulmonary effort is normal. No respiratory distress.     Breath sounds: Normal breath sounds. No wheezing,  rhonchi or rales.  Lymphadenopathy:     Cervical: Cervical adenopathy (mild) present.  Skin:    General: Skin is warm and dry.  Neurological:     Mental Status: She is alert and oriented to person, place, and time.  Psychiatric:        Behavior: Behavior normal.    BP 119/66 (BP Location: Right Arm, Patient  Position: Sitting, Cuff Size: Small)   Pulse 72   Temp 99.2 F (37.3 C) (Oral)   Resp 16   Wt 218 lb (98.9 kg)   LMP 06/19/2013   SpO2 100%   BMI 30.40 kg/m  Wt Readings from Last 3 Encounters:  11/18/20 218 lb (98.9 kg)  09/08/20 225 lb (102.1 kg)  02/12/20 224 lb (101.6 kg)       Assessment & Plan:   Problem List Items Addressed This Visit       Unprioritized   Viral URI - Primary    Rapid strep is negative.  3 days of symptoms.  No sign of acute OM. I suspect some eustachian tube dysfunction on the left in the setting of URI. Discussed the following recommendations with patient:  Please add sudafed and flonase for nasal congestion/ear pressure. You may also add claritin once a day.  For pain you may use tylenol or motrin as needed. Call if new/worsening symptoms or if symptoms are not improved in 3-4 days.        Other Visit Diagnoses     Sore throat       Relevant Orders   POCT rapid strep A (Completed)        No orders of the defined types were placed in this encounter.   I, [name], personally preformed the services described in this documentation.  All medical record entries made by the scribe were at my direction and in my presence.  I have reviewed the chart and discharge instructions (if applicable) and agree that the record reflects my personal performance and is accurate and complete. 11/18/2020   I,Olivia Reyes,acting as a Education administrator for Nance Pear, NP.,have documented all relevant documentation on the behalf of Nance Pear, NP,as directed by  Nance Pear, NP while in the presence of Nance Pear, NP.   Nance Pear, NP

## 2020-11-29 DIAGNOSIS — F4310 Post-traumatic stress disorder, unspecified: Secondary | ICD-10-CM | POA: Diagnosis not present

## 2020-11-29 DIAGNOSIS — F332 Major depressive disorder, recurrent severe without psychotic features: Secondary | ICD-10-CM | POA: Diagnosis not present

## 2020-11-29 DIAGNOSIS — F411 Generalized anxiety disorder: Secondary | ICD-10-CM | POA: Diagnosis not present

## 2020-12-07 ENCOUNTER — Ambulatory Visit: Payer: BC Managed Care – PPO | Admitting: Dermatology

## 2021-01-16 DIAGNOSIS — M9904 Segmental and somatic dysfunction of sacral region: Secondary | ICD-10-CM | POA: Diagnosis not present

## 2021-01-16 DIAGNOSIS — M9902 Segmental and somatic dysfunction of thoracic region: Secondary | ICD-10-CM | POA: Diagnosis not present

## 2021-01-16 DIAGNOSIS — M9901 Segmental and somatic dysfunction of cervical region: Secondary | ICD-10-CM | POA: Diagnosis not present

## 2021-02-07 DIAGNOSIS — L814 Other melanin hyperpigmentation: Secondary | ICD-10-CM | POA: Diagnosis not present

## 2021-02-07 DIAGNOSIS — D1801 Hemangioma of skin and subcutaneous tissue: Secondary | ICD-10-CM | POA: Diagnosis not present

## 2021-02-07 DIAGNOSIS — L219 Seborrheic dermatitis, unspecified: Secondary | ICD-10-CM | POA: Diagnosis not present

## 2021-02-07 DIAGNOSIS — L578 Other skin changes due to chronic exposure to nonionizing radiation: Secondary | ICD-10-CM | POA: Diagnosis not present

## 2021-04-24 DIAGNOSIS — F4320 Adjustment disorder, unspecified: Secondary | ICD-10-CM | POA: Diagnosis not present

## 2021-05-05 DIAGNOSIS — F4320 Adjustment disorder, unspecified: Secondary | ICD-10-CM | POA: Diagnosis not present

## 2021-05-08 DIAGNOSIS — F331 Major depressive disorder, recurrent, moderate: Secondary | ICD-10-CM | POA: Diagnosis not present

## 2021-05-08 DIAGNOSIS — F4310 Post-traumatic stress disorder, unspecified: Secondary | ICD-10-CM | POA: Diagnosis not present

## 2021-05-08 DIAGNOSIS — F411 Generalized anxiety disorder: Secondary | ICD-10-CM | POA: Diagnosis not present

## 2021-08-28 DIAGNOSIS — M9904 Segmental and somatic dysfunction of sacral region: Secondary | ICD-10-CM | POA: Diagnosis not present

## 2021-08-28 DIAGNOSIS — M9901 Segmental and somatic dysfunction of cervical region: Secondary | ICD-10-CM | POA: Diagnosis not present

## 2021-08-28 DIAGNOSIS — M9902 Segmental and somatic dysfunction of thoracic region: Secondary | ICD-10-CM | POA: Diagnosis not present

## 2021-08-31 DIAGNOSIS — B353 Tinea pedis: Secondary | ICD-10-CM | POA: Diagnosis not present

## 2021-08-31 DIAGNOSIS — L219 Seborrheic dermatitis, unspecified: Secondary | ICD-10-CM | POA: Diagnosis not present

## 2021-09-06 DIAGNOSIS — M9904 Segmental and somatic dysfunction of sacral region: Secondary | ICD-10-CM | POA: Diagnosis not present

## 2021-09-06 DIAGNOSIS — M9902 Segmental and somatic dysfunction of thoracic region: Secondary | ICD-10-CM | POA: Diagnosis not present

## 2021-09-06 DIAGNOSIS — M9901 Segmental and somatic dysfunction of cervical region: Secondary | ICD-10-CM | POA: Diagnosis not present

## 2021-10-03 DIAGNOSIS — F321 Major depressive disorder, single episode, moderate: Secondary | ICD-10-CM | POA: Diagnosis not present

## 2021-10-03 DIAGNOSIS — F32A Depression, unspecified: Secondary | ICD-10-CM | POA: Diagnosis not present

## 2021-10-04 DIAGNOSIS — M9904 Segmental and somatic dysfunction of sacral region: Secondary | ICD-10-CM | POA: Diagnosis not present

## 2021-10-04 DIAGNOSIS — M9901 Segmental and somatic dysfunction of cervical region: Secondary | ICD-10-CM | POA: Diagnosis not present

## 2021-10-04 DIAGNOSIS — M9902 Segmental and somatic dysfunction of thoracic region: Secondary | ICD-10-CM | POA: Diagnosis not present

## 2021-10-05 DIAGNOSIS — M9901 Segmental and somatic dysfunction of cervical region: Secondary | ICD-10-CM | POA: Diagnosis not present

## 2021-10-05 DIAGNOSIS — M9902 Segmental and somatic dysfunction of thoracic region: Secondary | ICD-10-CM | POA: Diagnosis not present

## 2021-10-05 DIAGNOSIS — M9904 Segmental and somatic dysfunction of sacral region: Secondary | ICD-10-CM | POA: Diagnosis not present

## 2021-10-11 ENCOUNTER — Encounter: Payer: Self-pay | Admitting: Family Medicine

## 2021-10-11 DIAGNOSIS — R42 Dizziness and giddiness: Secondary | ICD-10-CM

## 2021-10-12 DIAGNOSIS — F321 Major depressive disorder, single episode, moderate: Secondary | ICD-10-CM | POA: Diagnosis not present

## 2021-10-18 ENCOUNTER — Encounter: Payer: Self-pay | Admitting: Infectious Diseases

## 2021-11-01 DIAGNOSIS — F411 Generalized anxiety disorder: Secondary | ICD-10-CM | POA: Diagnosis not present

## 2021-11-01 DIAGNOSIS — F331 Major depressive disorder, recurrent, moderate: Secondary | ICD-10-CM | POA: Diagnosis not present

## 2021-11-01 DIAGNOSIS — F4310 Post-traumatic stress disorder, unspecified: Secondary | ICD-10-CM | POA: Diagnosis not present

## 2021-12-13 DIAGNOSIS — G43109 Migraine with aura, not intractable, without status migrainosus: Secondary | ICD-10-CM | POA: Diagnosis not present

## 2022-01-30 DIAGNOSIS — Z1272 Encounter for screening for malignant neoplasm of vagina: Secondary | ICD-10-CM | POA: Diagnosis not present

## 2022-01-30 DIAGNOSIS — Z6828 Body mass index (BMI) 28.0-28.9, adult: Secondary | ICD-10-CM | POA: Diagnosis not present

## 2022-01-30 DIAGNOSIS — Z01419 Encounter for gynecological examination (general) (routine) without abnormal findings: Secondary | ICD-10-CM | POA: Diagnosis not present

## 2022-02-08 DIAGNOSIS — L219 Seborrheic dermatitis, unspecified: Secondary | ICD-10-CM | POA: Diagnosis not present

## 2022-02-08 DIAGNOSIS — L578 Other skin changes due to chronic exposure to nonionizing radiation: Secondary | ICD-10-CM | POA: Diagnosis not present

## 2022-02-08 DIAGNOSIS — D229 Melanocytic nevi, unspecified: Secondary | ICD-10-CM | POA: Diagnosis not present

## 2022-02-08 DIAGNOSIS — L814 Other melanin hyperpigmentation: Secondary | ICD-10-CM | POA: Diagnosis not present

## 2022-08-09 ENCOUNTER — Other Ambulatory Visit (HOSPITAL_BASED_OUTPATIENT_CLINIC_OR_DEPARTMENT_OTHER): Payer: Self-pay

## 2022-08-09 DIAGNOSIS — F331 Major depressive disorder, recurrent, moderate: Secondary | ICD-10-CM | POA: Diagnosis not present

## 2022-08-09 DIAGNOSIS — F411 Generalized anxiety disorder: Secondary | ICD-10-CM | POA: Diagnosis not present

## 2022-08-09 DIAGNOSIS — F4310 Post-traumatic stress disorder, unspecified: Secondary | ICD-10-CM | POA: Diagnosis not present

## 2022-08-09 MED ORDER — BUPROPION HCL ER (XL) 150 MG PO TB24
150.0000 mg | ORAL_TABLET | Freq: Every morning | ORAL | 0 refills | Status: DC
  Filled 2022-08-09: qty 30, 30d supply, fill #0
  Filled 2022-09-15: qty 30, 30d supply, fill #1

## 2022-08-09 MED ORDER — CITALOPRAM HYDROBROMIDE 20 MG PO TABS
20.0000 mg | ORAL_TABLET | Freq: Every day | ORAL | 0 refills | Status: DC
  Filled 2022-08-09: qty 30, 30d supply, fill #0
  Filled 2022-09-15 – 2022-10-24 (×3): qty 30, 30d supply, fill #1

## 2022-09-15 DIAGNOSIS — F4310 Post-traumatic stress disorder, unspecified: Secondary | ICD-10-CM | POA: Diagnosis not present

## 2022-09-15 DIAGNOSIS — F411 Generalized anxiety disorder: Secondary | ICD-10-CM | POA: Diagnosis not present

## 2022-09-15 DIAGNOSIS — F331 Major depressive disorder, recurrent, moderate: Secondary | ICD-10-CM | POA: Diagnosis not present

## 2022-09-17 ENCOUNTER — Other Ambulatory Visit: Payer: Self-pay

## 2022-09-17 ENCOUNTER — Other Ambulatory Visit (HOSPITAL_BASED_OUTPATIENT_CLINIC_OR_DEPARTMENT_OTHER): Payer: Self-pay

## 2022-09-17 MED ORDER — METHYLPHENIDATE HCL ER (CD) 20 MG PO CPCR
20.0000 mg | ORAL_CAPSULE | Freq: Every morning | ORAL | 0 refills | Status: DC
  Filled 2022-09-17: qty 30, 30d supply, fill #0

## 2022-09-17 MED ORDER — CLONAZEPAM 0.5 MG PO TABS
0.2500 mg | ORAL_TABLET | Freq: Two times a day (BID) | ORAL | 1 refills | Status: AC
  Filled 2022-09-17 (×2): qty 60, 30d supply, fill #0

## 2022-09-17 MED ORDER — BUPROPION HCL ER (XL) 150 MG PO TB24
150.0000 mg | ORAL_TABLET | Freq: Every morning | ORAL | 0 refills | Status: AC
  Filled 2022-09-17 (×2): qty 30, 30d supply, fill #0
  Filled 2022-10-15 – 2022-10-24 (×2): qty 30, 30d supply, fill #1

## 2022-09-17 MED ORDER — CITALOPRAM HYDROBROMIDE 20 MG PO TABS
20.0000 mg | ORAL_TABLET | Freq: Every day | ORAL | 0 refills | Status: DC
  Filled 2022-09-17: qty 90, 90d supply, fill #0
  Filled 2022-09-17: qty 30, 30d supply, fill #0

## 2022-10-02 ENCOUNTER — Encounter (HOSPITAL_BASED_OUTPATIENT_CLINIC_OR_DEPARTMENT_OTHER): Payer: Self-pay | Admitting: Urology

## 2022-10-02 ENCOUNTER — Emergency Department (HOSPITAL_BASED_OUTPATIENT_CLINIC_OR_DEPARTMENT_OTHER): Payer: BC Managed Care – PPO

## 2022-10-02 ENCOUNTER — Emergency Department (HOSPITAL_BASED_OUTPATIENT_CLINIC_OR_DEPARTMENT_OTHER)
Admission: EM | Admit: 2022-10-02 | Discharge: 2022-10-02 | Disposition: A | Discharge status: Home / Self Care | Payer: BC Managed Care – PPO | Attending: Emergency Medicine | Admitting: Emergency Medicine

## 2022-10-02 ENCOUNTER — Other Ambulatory Visit (HOSPITAL_BASED_OUTPATIENT_CLINIC_OR_DEPARTMENT_OTHER): Payer: Self-pay

## 2022-10-02 DIAGNOSIS — R1032 Left lower quadrant pain: Secondary | ICD-10-CM

## 2022-10-02 DIAGNOSIS — R197 Diarrhea, unspecified: Secondary | ICD-10-CM | POA: Diagnosis not present

## 2022-10-02 DIAGNOSIS — E876 Hypokalemia: Secondary | ICD-10-CM | POA: Diagnosis not present

## 2022-10-02 DIAGNOSIS — K5732 Diverticulitis of large intestine without perforation or abscess without bleeding: Secondary | ICD-10-CM | POA: Diagnosis not present

## 2022-10-02 LAB — URINALYSIS, ROUTINE W REFLEX MICROSCOPIC
Glucose, UA: NEGATIVE mg/dL
Ketones, ur: >=80 mg/dL — AB
Leukocytes,Ua: NEGATIVE
Nitrite: NEGATIVE
Protein, ur: NEGATIVE mg/dL
Specific Gravity, Urine: 1.025 (ref 1.005–1.030)
pH: 5.5 (ref 5.0–8.0)

## 2022-10-02 LAB — COMPREHENSIVE METABOLIC PANEL
ALT: 13 U/L (ref 0–44)
AST: 21 U/L (ref 15–41)
Albumin: 4.4 g/dL (ref 3.5–5.0)
Alkaline Phosphatase: 62 U/L (ref 38–126)
Anion gap: 13 (ref 5–15)
BUN: 10 mg/dL (ref 6–20)
CO2: 23 mmol/L (ref 22–32)
Calcium: 9.3 mg/dL (ref 8.9–10.3)
Chloride: 99 mmol/L (ref 98–111)
Creatinine, Ser: 0.79 mg/dL (ref 0.44–1.00)
GFR, Estimated: >60 mL/min (ref >60)
Glucose, Bld: 90 mg/dL (ref 70–99)
Potassium: 3.1 mmol/L — ABNORMAL LOW (ref 3.5–5.1)
Sodium: 135 mmol/L (ref 135–145)
Total Bilirubin: 1.1 mg/dL (ref 0.3–1.2)
Total Protein: 8.1 g/dL (ref 6.5–8.1)

## 2022-10-02 LAB — CBC
HCT: 39.3 % (ref 36.0–46.0)
Hemoglobin: 13.5 g/dL (ref 12.0–15.0)
MCH: 30.2 pg (ref 26.0–34.0)
MCHC: 34.4 g/dL (ref 30.0–36.0)
MCV: 87.9 fL (ref 80.0–100.0)
Platelets: 277 10*3/uL (ref 150–400)
RBC: 4.47 MIL/uL (ref 3.87–5.11)
RDW: 12.2 % (ref 11.5–15.5)
WBC: 11.9 10*3/uL — ABNORMAL HIGH (ref 4.0–10.5)
nRBC: 0 % (ref 0.0–0.2)

## 2022-10-02 LAB — URINALYSIS, MICROSCOPIC (REFLEX): WBC, UA: NONE SEEN WBC/hpf (ref 0–5)

## 2022-10-02 LAB — PREGNANCY, URINE: Preg Test, Ur: NEGATIVE

## 2022-10-02 LAB — LIPASE, BLOOD: Lipase: 29 U/L (ref 11–51)

## 2022-10-02 MED ORDER — AMOXICILLIN-POT CLAVULANATE 875-125 MG PO TABS
1.0000 | ORAL_TABLET | Freq: Once | ORAL | Status: AC
  Administered 2022-10-02: 1 via ORAL
  Filled 2022-10-02: qty 1

## 2022-10-02 MED ORDER — IOHEXOL 300 MG/ML  SOLN
100.0000 mL | Freq: Once | INTRAMUSCULAR | Status: AC | PRN
  Administered 2022-10-02: 100 mL via INTRAVENOUS

## 2022-10-02 MED ORDER — AMOXICILLIN-POT CLAVULANATE 875-125 MG PO TABS
1.0000 | ORAL_TABLET | Freq: Two times a day (BID) | ORAL | 0 refills | Status: AC
  Filled 2022-10-02: qty 20, 10d supply, fill #0

## 2022-10-02 MED ORDER — POTASSIUM CHLORIDE CRYS ER 20 MEQ PO TBCR
40.0000 meq | EXTENDED_RELEASE_TABLET | Freq: Once | ORAL | Status: AC
  Administered 2022-10-02: 40 meq via ORAL
  Filled 2022-10-02: qty 2

## 2022-10-02 NOTE — ED Provider Notes (Cosign Needed Addendum)
[place] [facility] Provider Note   CSN: [id number] Arrival date & time: 10/02/22  1756     History  Chief Complaint  Patient presents with   Abdominal Pain    Olivia Reyes is a 37 y.o. female.  Patient with history of cholecystectomy, hysterectomy presents to the emergency department for evaluation of left lower quadrant pain occurring over the past 48 hours.  Symptoms began as a mild pain.  Patient had some diarrhea episodes but she has a history of GI "issues" that she is following up with gastroenterology for a month.  Yesterday the pain continued to get worse.  She is having worsening pain with motion.  She denies fevers.  She had some dark urine yesterday but no hematuria or dysuria.  No bowel movements today.  She did have a televisit yesterday and it was recommended that she go to the emergency room.  Patient was concerned she was having a kidney stone.  She has no history of this.  She does report a history of ovarian cyst, but reports that they felt different than her current pain and typically will get better after about a day.  She states that since having a hysterectomy she has not had a lot of problems with pelvic pain.       Home Medications Prior to Admission medications   Medication Sig Start Date End Date Taking? Authorizing Provider  buPROPion (WELLBUTRIN XL) 150 MG 24 hr tablet Take 150 mg by mouth daily.    [provider]  buPROPion (WELLBUTRIN XL) 150 MG 24 hr tablet Take 1 tablet (150 mg total) by mouth every morning for depression. 08/09/22     buPROPion (WELLBUTRIN XL) 150 MG 24 hr tablet Take 1 tablet (150 mg total) by mouth every morning. For depression 09/15/22     celecoxib (CELEBREX) 200 MG capsule Take 200 mg by mouth daily.    [provider]  citalopram (CELEXA) 20 MG tablet Take 30 mg by mouth at bedtime.     [provider]  citalopram (CELEXA) 20 MG tablet Take 1 tablet (20 mg total) by  mouth at bedtime for anxiety/depression. 08/09/22     citalopram (CELEXA) 20 MG tablet Take 1 tablet (20 mg total) by mouth at bedtime. For anxiety/depression 09/15/22     clonazePAM (KLONOPIN) 0.5 MG tablet Take 0.5 mg by mouth 2 (two) times daily as needed for anxiety.    [provider]  clonazePAM (KLONOPIN) 0.5 MG tablet Take 0.5-1 tablets (0.25-0.5 mg total) by mouth 2 (two) times daily as needed for anxiety/panic attacks 09/15/22     methylphenidate (METADATE CD) 20 MG CR capsule Take 1 capsule (20 mg total) by mouth every morning. For ADHD 09/15/22     triamterene-hydrochlorothiazide (MAXZIDE) 75-50 MG tablet Take 1 tablet by mouth daily.    [provider]  valACYclovir (VALTREX) 1000 MG tablet TAKE 1/2 TABLET (500 MG TOTAL) BY MOUTH DAILY. 06/14/20   Ginnie Smart, MD      Allergies    Patient has no known allergies.    Review of Systems   Review of Systems  Physical Exam Updated Vital Signs BP 121/64 (BP Location: Left Arm)   Pulse 74   Temp 97.7 F (36.5 C)   Resp 20   Ht 5\' 11"  (1.803 m)   Wt 98.9 kg   LMP 06/19/2013   SpO2 100%   BMI 30.41 kg/m   Physical Exam Vitals and nursing  note reviewed.  Constitutional:      General: She is not in acute distress.    Appearance: She is well-developed.  HENT:     Head: Normocephalic and atraumatic.     Right Ear: External ear normal.     Left Ear: External ear normal.     Nose: Nose normal.  Eyes:     Conjunctiva/sclera: Conjunctivae normal.  Cardiovascular:     Rate and Rhythm: Normal rate and regular rhythm.     Heart sounds: No murmur heard. Pulmonary:     Effort: No respiratory distress.     Breath sounds: No wheezing, rhonchi or rales.  Abdominal:     Palpations: Abdomen is soft.     Tenderness: There is abdominal tenderness in the left lower quadrant. There is no guarding or rebound.     Comments: Patient with moderate tenderness to palpation left lateral left lower abdomen.  She winces when  I push over these areas.  Musculoskeletal:     Cervical back: Normal range of motion and neck supple.     Right lower leg: No edema.     Left lower leg: No edema.  Skin:    General: Skin is warm and dry.     Findings: No rash.  Neurological:     General: No focal deficit present.     Mental Status: She is alert. Mental status is at baseline.     Motor: No weakness.  Psychiatric:        Mood and Affect: Mood normal.     ED Results / Procedures / Treatments   Labs (all labs ordered are listed, but only abnormal results are displayed) Labs Reviewed  URINALYSIS, ROUTINE W REFLEX MICROSCOPIC - Abnormal; Notable for the following components:      Result Value   Hgb urine dipstick SMALL (*)    Bilirubin Urine SMALL (*)    Ketones, ur >=80 (*)    All other components within normal limits  COMPREHENSIVE METABOLIC PANEL - Abnormal; Notable for the following components:   Potassium 3.1 (*)    All other components within normal limits  CBC - Abnormal; Notable for the following components:   WBC 11.9 (*)    All other components within normal limits  URINALYSIS, MICROSCOPIC (REFLEX) - Abnormal; Notable for the following components:   Bacteria, UA RARE (*)    All other components within normal limits  PREGNANCY, URINE  LIPASE, BLOOD    EKG None  Radiology No results found.  Procedures Procedures    Medications Ordered in ED Medications - No data to display  ED Course/ Medical Decision Making/ A&P    Patient seen and examined. History obtained directly from patient.   Labs/EKG: Ordered CBC, CMP, lipase, UA.  Imaging: Ordered CT abdomen pelvis.  Medications/Fluids: Ordered: Offered pain medication and nausea medication, patient declines.   Most recent vital signs reviewed and are as follows: BP 121/64 (BP Location: Left Arm)   Pulse 74   Temp 97.7 F (36.5 C)   Resp 20   Ht 5\' 11"  (1.803 m)   Wt 98.9 kg   LMP 06/19/2013   SpO2 100%   BMI 30.41 kg/m    Initial impression: Left lower quadrant pain.   6:58 PM Signout to Schuman PA-C at shift change.   Plan: F/u on CT results. Dispo per findings.  Medical Decision Making Amount and/or Complexity of Data Reviewed Labs: ordered. Radiology: ordered.   For this patient's complaint of abdominal pain, the following conditions were considered on the differential diagnosis: gastritis/PUD, enteritis/duodenitis, appendicitis, cholelithiasis/cholecystitis, cholangitis, pancreatitis, ruptured viscus, colitis, diverticulitis, small/large bowel obstruction, proctitis, cystitis, pyelonephritis, ureteral colic, aortic dissection, aortic aneurysm. In women, ectopic pregnancy, pelvic inflammatory disease, ovarian cysts, and tubo-ovarian abscess were also considered. Atypical chest etiologies were also considered including ACS, PE, and pneumonia.         Final Clinical Impression(s) / ED Diagnoses Final diagnoses:  LLQ pain  Hypokalemia    Rx / DC Orders ED Discharge Orders     None          Renne Crigler, PA-C 10/02/22 1859    Alvira Monday, MD 10/03/22 1358

## 2022-10-02 NOTE — Discharge Instructions (Signed)
Please follow-up with the GI specialist I have attached your for you.  Your CT scan showed that you have sigmoid diverticulitis which would explain your symptoms.  You are given 1 dose of the antibiotic in the emergency department but please pick up the rest of the antibiotics at your pharmacy and take as prescribed.  As we discussed you declined pain medications and Zofran at this time.  It is very important you follow-up with a GI specialist and that if symptoms worsen to return to the ER.

## 2022-10-02 NOTE — ED Notes (Signed)
D/c paperwork reviewed with pt, including prescriptions and follow up care.  No questions or concerns voiced at time of d/c. . Pt verbalized understanding, Ambulatory without assistance to ED exit, NAD.   

## 2022-10-02 NOTE — ED Triage Notes (Signed)
Pt states Sunday evening started having LLQ pain/left pelvic pain  Had an episode of diarrhea  States continued pain since then and chills  Pain worse with movement

## 2022-10-02 NOTE — ED Provider Notes (Signed)
Patient given in sign out by Rhea Bleacher, PA-C.  Please review their note for patient HPI, physical exam, workup.  At this time the plan is to follow-up on patient's CT scan.  Patient CT scan showed sigmoid diverticulitis without perforation or abscess formation.  This would explain patient's left lower quadrant pain.  Patient is a healthy 37 year old female and outside of the left lower quadrant had an unremarkable physical exam.  Patient stated that she is only in pain when she moves however does not want any strong pain meds.  When I spoke to the patient about being admitted or discharged with outpatient therapy patient stated that she wanted to be discharged.  At this time I believe patient is stable to be discharged.  Patient will be given 1 dose of Augmentin in the emergency department and potassium pills to replenish her potassium.  Patient is not nauseous and has not endorsed any nausea throughout the course of this concern and so Zofran will be without at this point.  Patient also adamantly denied any pain medications as she states it makes her constipated.  Patient be given GI follow-up along with Augmentin and given strict return precautions.  Patient stable for discharge at this time.  Patient verbalized agreement to this plan.   Remi Deter 10/02/22 2039    Alvira Monday, MD 10/03/22 1359

## 2022-10-03 ENCOUNTER — Other Ambulatory Visit (HOSPITAL_BASED_OUTPATIENT_CLINIC_OR_DEPARTMENT_OTHER): Payer: Self-pay

## 2022-10-10 DIAGNOSIS — F331 Major depressive disorder, recurrent, moderate: Secondary | ICD-10-CM | POA: Diagnosis not present

## 2022-10-10 DIAGNOSIS — F411 Generalized anxiety disorder: Secondary | ICD-10-CM | POA: Diagnosis not present

## 2022-10-10 DIAGNOSIS — F4310 Post-traumatic stress disorder, unspecified: Secondary | ICD-10-CM | POA: Diagnosis not present

## 2022-10-15 ENCOUNTER — Other Ambulatory Visit (HOSPITAL_BASED_OUTPATIENT_CLINIC_OR_DEPARTMENT_OTHER): Payer: Self-pay

## 2022-10-16 ENCOUNTER — Other Ambulatory Visit (HOSPITAL_BASED_OUTPATIENT_CLINIC_OR_DEPARTMENT_OTHER): Payer: Self-pay

## 2022-10-16 ENCOUNTER — Other Ambulatory Visit: Payer: Self-pay

## 2022-10-24 ENCOUNTER — Other Ambulatory Visit (HOSPITAL_BASED_OUTPATIENT_CLINIC_OR_DEPARTMENT_OTHER): Payer: Self-pay

## 2022-10-24 MED ORDER — METHYLPHENIDATE HCL ER (CD) 20 MG PO CPCR
20.0000 mg | ORAL_CAPSULE | Freq: Every morning | ORAL | 0 refills | Status: DC
  Filled 2022-11-26: qty 30, 30d supply, fill #0

## 2022-10-24 MED ORDER — METHYLPHENIDATE HCL ER (CD) 20 MG PO CPCR
20.0000 mg | ORAL_CAPSULE | Freq: Every morning | ORAL | 0 refills | Status: DC
  Filled 2022-10-24: qty 30, 30d supply, fill #0

## 2022-10-24 MED ORDER — METHYLPHENIDATE HCL ER (CD) 20 MG PO CPCR
20.0000 mg | ORAL_CAPSULE | Freq: Every morning | ORAL | 0 refills | Status: AC
  Filled 2022-12-25: qty 30, 30d supply, fill #0

## 2022-10-31 ENCOUNTER — Other Ambulatory Visit (HOSPITAL_BASED_OUTPATIENT_CLINIC_OR_DEPARTMENT_OTHER): Payer: Self-pay

## 2022-11-26 ENCOUNTER — Other Ambulatory Visit (HOSPITAL_BASED_OUTPATIENT_CLINIC_OR_DEPARTMENT_OTHER): Payer: Self-pay

## 2022-12-21 ENCOUNTER — Other Ambulatory Visit: Payer: BC Managed Care – PPO

## 2022-12-21 ENCOUNTER — Ambulatory Visit: Payer: BC Managed Care – PPO | Admitting: Nurse Practitioner

## 2022-12-21 ENCOUNTER — Encounter: Payer: Self-pay | Admitting: Nurse Practitioner

## 2022-12-21 VITALS — BP 118/68 | HR 70 | Ht 71.0 in | Wt 208.0 lb

## 2022-12-21 DIAGNOSIS — K5732 Diverticulitis of large intestine without perforation or abscess without bleeding: Secondary | ICD-10-CM | POA: Diagnosis not present

## 2022-12-21 DIAGNOSIS — K5792 Diverticulitis of intestine, part unspecified, without perforation or abscess without bleeding: Secondary | ICD-10-CM

## 2022-12-21 DIAGNOSIS — K5909 Other constipation: Secondary | ICD-10-CM

## 2022-12-21 LAB — CBC WITH DIFFERENTIAL/PLATELET
Basophils Absolute: 0.1 10*3/uL (ref 0.0–0.1)
Basophils Relative: 0.9 % (ref 0.0–3.0)
Eosinophils Absolute: 0.1 10*3/uL (ref 0.0–0.7)
Eosinophils Relative: 1.2 % (ref 0.0–5.0)
HCT: 38.5 % (ref 36.0–46.0)
Hemoglobin: 12.9 g/dL (ref 12.0–15.0)
Lymphocytes Relative: 33.6 % (ref 12.0–46.0)
Lymphs Abs: 2.1 10*3/uL (ref 0.7–4.0)
MCHC: 33.3 g/dL (ref 30.0–36.0)
MCV: 90.4 fl (ref 78.0–100.0)
Monocytes Absolute: 0.4 10*3/uL (ref 0.1–1.0)
Monocytes Relative: 6.9 % (ref 3.0–12.0)
Neutro Abs: 3.5 10*3/uL (ref 1.4–7.7)
Neutrophils Relative %: 57.4 % (ref 43.0–77.0)
Platelets: 279 10*3/uL (ref 150.0–400.0)
RBC: 4.26 Mil/uL (ref 3.87–5.11)
RDW: 13.3 % (ref 11.5–15.5)
WBC: 6.2 10*3/uL (ref 4.0–10.5)

## 2022-12-21 LAB — C-REACTIVE PROTEIN: CRP: <1 mg/dL (ref 0.5–20.0)

## 2022-12-21 NOTE — Patient Instructions (Addendum)
Your provider has requested that you go to the basement level for lab work before leaving today. Press "B" on the elevator. The lab is located at the first door on the left as you exit the elevator.  Miralax- take 1/2 dose every other night. If tolerated, increase to 1 full cap every other night.  Drink 8 glasses of water daily.  Contact our office if abdominal pain recurs.  Due to recent changes in healthcare laws, you may see the results of your imaging and laboratory studies on MyChart before your provider has had a chance to review them.  We understand that in some cases there may be results that are confusing or concerning to you. Not all laboratory results come back in the same time frame and the provider may be waiting for multiple results in order to interpret others.  Please give [place] 48 hours in order for your provider to thoroughly review all the results before contacting the office for clarification of your results.   Thank you for trusting me with your gastrointestinal care!   Alcide Evener, CRNP

## 2022-12-21 NOTE — Progress Notes (Signed)
12/21/2022 MACLOVIA LU [id number] February 23, 1986   Chief Complaint: Follow-up diverticulitis  History of Present Illness: Olivia Reyes is a 37 year old female with a past medical history of obstipation, anal fissure. S/P hysterectomy w b/l salpingectomy on 11/27/2018.  She is known by Dr. Barron Alvine.  She underwent a colonoscopy 02/12/2020 due to hematochezia associated with an anal fissure and constipation.  The colonoscopy identified three 3 to 5 mm tubular adenomatous/hyperplastic polyps removed from the sigmoid colon and rectum and diverticulosis was present in the sigmoid colon.  She was advised to repeat a colonoscopy in 5 years.  She has chronic constipation, passes a bowel movement every 3 to 4 days.  She uses MiraLAX daily in the past which resulted in passing multiple loose stools so she stopped taking it.  She tried Metamucil which was ineffective.  She developed LLQ pain for 3 to 4 days which progressively worsened therefore she presented to the ED 10/02/2022 for further evaluation.  Labs in the ED showed a WBC count 11.9.  Calcium 3.1.  Normal LFTs.  CTAP showed sigmoid diverticulitis without abscess or perforation.  She received KCL POand was prescribed a course of Augmentin p.o. twice daily for 10 days and was discharged home.  She presents today for further follow-up.  She stated her LLQ pain abated 3 to 4 days after starting Augmentin.  She continues to have constipation, passes a bowel movement every 3 to 4 days.  She passes a watery bowel movement or hard stools.  She sees a small amount of bright red blood on the toilet tissue if she strains or sits on the commode for a long time.  She attributes rectal bleeding to hemorrhoidal discomfort and does not feel she has an active anal fissure.     Latest Ref Rng & Units 10/02/2022    6:10 PM 02/10/2020   10:19 AM 11/28/2018    3:52 AM  CBC  WBC 4.0 - 10.5 K/uL 11.9  7.1  10.5   Hemoglobin 12.0 - 15.0 g/dL 21.3  08.6  57.8   Hematocrit  36.0 - 46.0 % 39.3  43.9  34.0   Platelets 150 - 400 K/uL 277  302  210         Latest Ref Rng & Units 10/02/2022    6:10 PM 02/10/2020   10:19 AM 11/28/2018    3:52 AM  CMP  Glucose 70 - 99 mg/dL 90  88  469   BUN 6 - 20 mg/dL 10  15  10    Creatinine 0.44 - 1.00 mg/dL 6.29  5.28  4.13   Sodium 135 - 145 mmol/L 135  138  138   Potassium 3.5 - 5.1 mmol/L 3.1  4.5  4.4   Chloride 98 - 111 mmol/L 99  102  107   CO2 22 - 32 mmol/L 23  25  22    Calcium 8.9 - 10.3 mg/dL 9.3  24.4  9.3   Total Protein 6.5 - 8.1 g/dL 8.1  7.6    Total Bilirubin 0.3 - 1.2 mg/dL 1.1  0.6    Alkaline Phos 38 - 126 U/L 62     AST 15 - 41 U/L 21  14    ALT 0 - 44 U/L 13  11      CTAP with contrast 10/02/2022: FINDINGS: Lower chest: The visualized lung bases are clear.   No intra-abdominal free air or free fluid.   Hepatobiliary: The liver is unremarkable.  Cholecystectomy. No retained calcified stone noted in the central CBD.   Pancreas: Unremarkable. No pancreatic ductal dilatation or surrounding inflammatory changes.   Spleen: Normal in size without focal abnormality.   Adrenals/Urinary Tract: The adrenal glands are unremarkable. The kidneys, visualized ureters, and the urinary bladder appear unremarkable.   Stomach/Bowel: There is sigmoid diverticulosis without active inflammatory changes consistent with acute diverticulitis. No diverticular abscess or perforation. There is no bowel obstruction. The appendix is normal.   Vascular/Lymphatic: The abdominal aorta and IVC are unremarkable. No portal venous gas. There is no adenopathy.   Reproductive: Hysterectomy. Small left ovarian follicles, physiologic. No imaging follow-up. The right ovary is unremarkable.   Other: None   Musculoskeletal: No acute or significant osseous findings.   IMPRESSION: Sigmoid diverticulitis. No diverticular abscess or perforation.  PAST GI PROCEDURES:  Colonoscopy 02/12/2020: - Three 3 to 5 mm polyps in the  rectum and in the sigmoid colon, removed with a cold snare. Resected and retrieved.  - Diverticulosis in the sigmoid colon. - Normal mucosa in the entire  - 5 Year recall colonoscopy  1. Surgical [P], right colon - COLONIC MUCOSA WITH EDEMA - NEGATIVE FOR ACUTE INFLAMMATION, INCREASED INTRAEPITHELIAL LYMPHOCYTES OR THICKENED SUBEPITHELIAL COLLAGEN TABLE 2. Surgical [P], left colon - COLONIC MUCOSA WITH EDEMA - NEGATIVE FOR ACUTE INFLAMMATION, INCREASED INTRAEPITHELIAL LYMPHOCYTES OR THICKENED SUBEPITHELIAL COLLAGEN TABLE 3. Surgical [P], colon, sigmoid, rectal, polyp (3) - TUBULAR ADENOMA WITHOUT HIGH-GRADE DYSPLASIA OR MALIGNANCY - HYPERPLASTIC POLYP(S)  Current Outpatient Medications on File Prior to Visit  Medication Sig Dispense Refill   buPROPion (WELLBUTRIN XL) 150 MG 24 hr tablet Take 150 mg by mouth daily.     buPROPion (WELLBUTRIN XL) 150 MG 24 hr tablet Take 1 tablet (150 mg total) by mouth every morning. For depression 90 tablet 0   clonazePAM (KLONOPIN) 0.5 MG tablet Take 0.5-1 tablets (0.25-0.5 mg total) by mouth 2 (two) times daily as needed for anxiety/panic attacks 60 tablet 1   methylphenidate (METADATE CD) 20 MG CR capsule Take 1 capsule(s) by mouth every morning for ADHD 30 capsule 0   rizatriptan (MAXALT-MLT) 5 MG disintegrating tablet Take 5 mg by mouth as needed for migraine. May repeat in 2 hours if needed     triamterene-hydrochlorothiazide (MAXZIDE) 75-50 MG tablet Take 1 tablet by mouth daily.     valACYclovir (VALTREX) 1000 MG tablet TAKE 1/2 TABLET (500 MG TOTAL) BY MOUTH DAILY. 45 tablet 7   No current facility-administered medications on file prior to visit.   No Known Allergies  Current Medications, Allergies, Past Medical History, Past Surgical History, Family History and Social History were reviewed in [employer] record.  Review of Systems:   Constitutional: Negative for fever, sweats, chills or weight loss.  Respiratory:  Negative for shortness of breath.   Cardiovascular: Negative for chest pain, palpitations and leg swelling.  Gastrointestinal: See HPI.  Musculoskeletal: Negative for back pain or muscle aches.  Neurological: Negative for dizziness, headaches or paresthesias.   Physical Exam: BP 118/68   Pulse 70   Ht 5\' 11"  (1.803 m)   Wt 208 lb (94.3 kg)   LMP 06/19/2013   BMI 29.01 kg/m  General: 37 year old female in no acute distress. Head: Normocephalic and atraumatic. Eyes: No scleral icterus. Conjunctiva pink . Ears: Normal auditory acuity. Mouth: Dentition intact. No ulcers or lesions.  Lungs: Clear throughout to auscultation. Heart: Regular rate and rhythm, no murmur. Abdomen: Soft, nondistended. Mild tenderness to the central lower abdomen  without rebound or guarding. No masses or hepatomegaly. Normal bowel sounds x 4 quadrants.  Rectal: Patient declined rectal exam today. Musculoskeletal: Symmetrical with no gross deformities. Extremities: No edema. Neurological: Alert oriented x 4. No focal deficits.  Psychological: Alert and cooperative. Normal mood and affect  Assessment and Recommendations:  37 year old female with LLQ pain x 3 to 4 days diagnosed with sigmoid diverticulitis without abscess or perforation per CTAP 10/02/2022.  WBC 11.9.  Her abdominal pain abated within 3 to 4 days after taking Augmentin 875 mg twice daily which was prescribed for total of 10 days.  Chronic constipation likely trigger for diverticulitis. -Avoid constipation -Drink 64 ounces of water daily -MiraLAX one half dose every other night, if tolerated increase to 1 capful every other night -CBC, CRP -Patient to contact office if LLQ pain recurs -Dr. Barron Alvine to verify if recall colonoscopy due earlier than 01/2025 due to new diagnosis of diverticulitis  Chronic constipation -See plan above -Consider trial with Linzess if constipation persists or worsens -TTG, IgA  Occasional bright red blood on the  toilet tissue, likely from hemorrhoids.  History of anal fissure, patient denies having significant anal pain as she experienced in the past when initially diagnosed with a fissure. -She elects to use steroid hemorrhoidal cream for now, did not feel she needed Anusol suppositories -Patient will contact office if rectal bleeding persists or worsens -And a rectal exam if bleeding persists or worsens  History of tubular adenomatous/hyperplastic colon and rectal polyps per colonoscopy 01/2020 -Next colon polyp surveillance colonoscopy due 01/2025

## 2022-12-25 ENCOUNTER — Other Ambulatory Visit (HOSPITAL_BASED_OUTPATIENT_CLINIC_OR_DEPARTMENT_OTHER): Payer: Self-pay

## 2022-12-25 ENCOUNTER — Other Ambulatory Visit: Payer: Self-pay

## 2022-12-25 ENCOUNTER — Telehealth: Payer: Self-pay | Admitting: *Deleted

## 2022-12-25 NOTE — Telephone Encounter (Signed)
Sent message to MyChart since was not able to contact patient via phone.

## 2022-12-25 NOTE — Telephone Encounter (Signed)
Please see not from pt and advise

## 2022-12-27 IMAGING — US US EXTREM LOW VENOUS*L*
1 series · 13 of 24 positions shown · non-contrast
Comparison: None.

CLINICAL DATA: Left lower extremity pain and edema. Evaluate for
DVT.



[Series 1: us extrem low venous*left* · 13 of 42 slices shown]
[im 1/42]
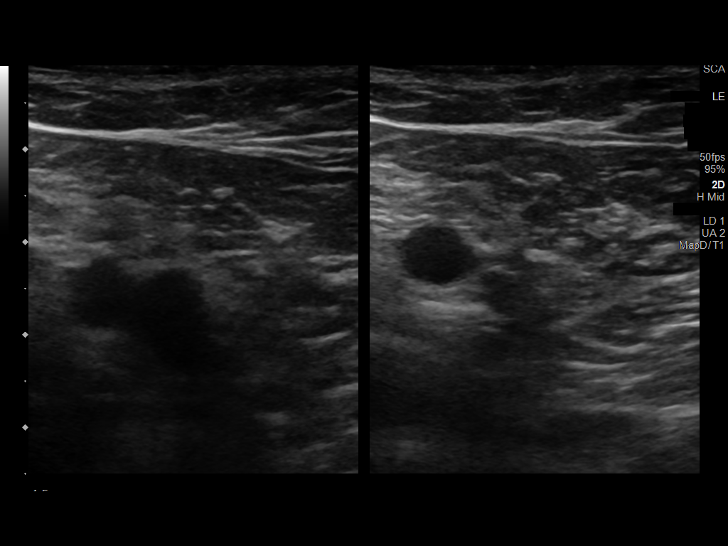
[im 4/42]
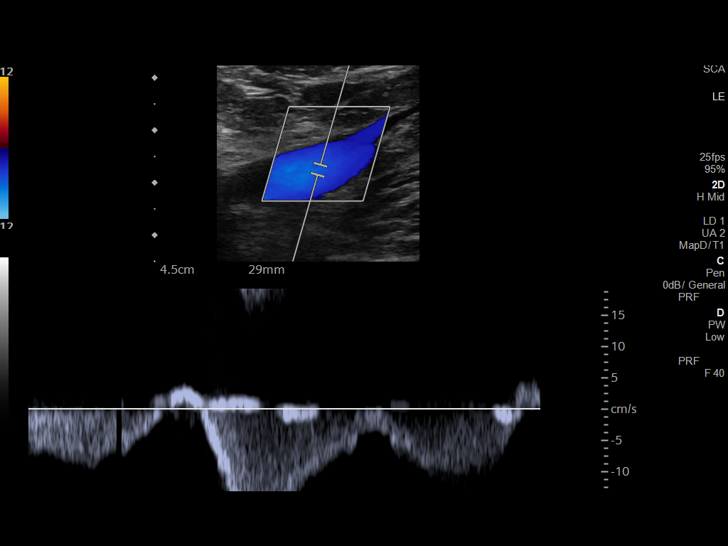
[im 8/42]
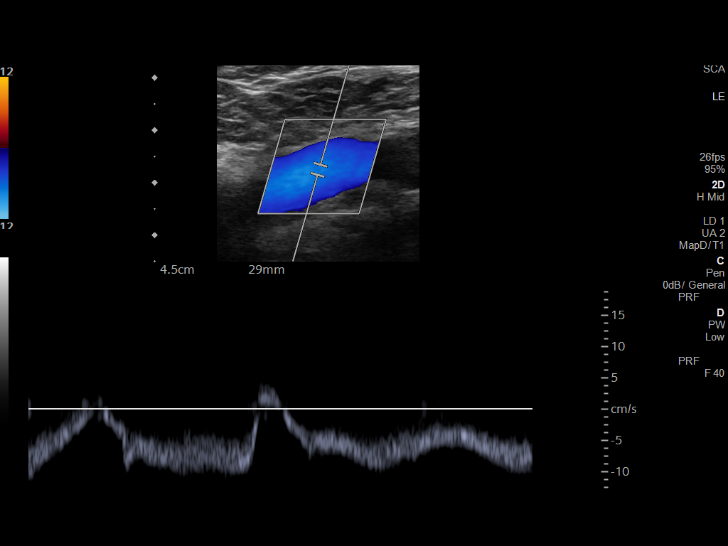
[im 11/42]
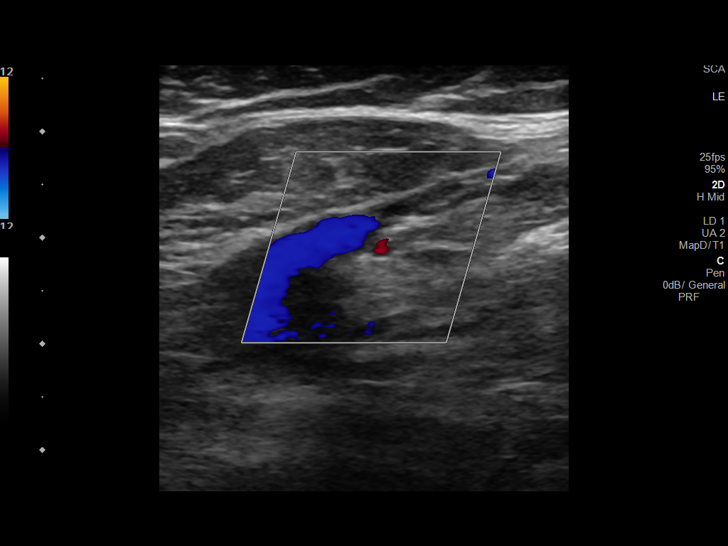
[im 15/42]
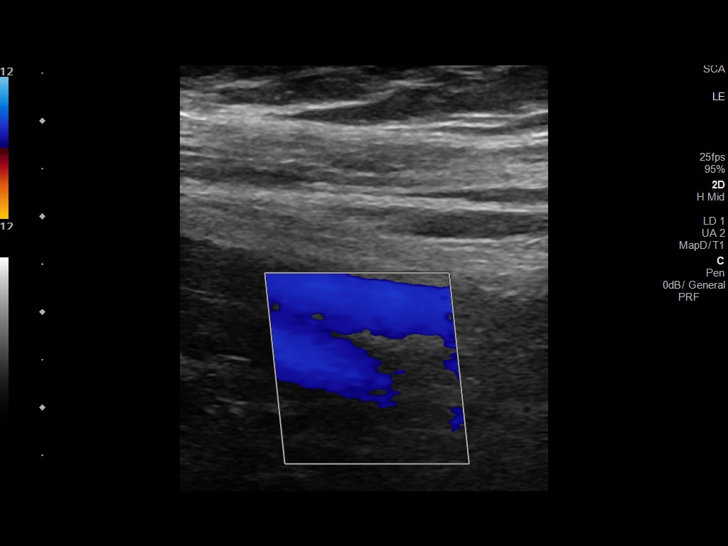
[im 18/42]
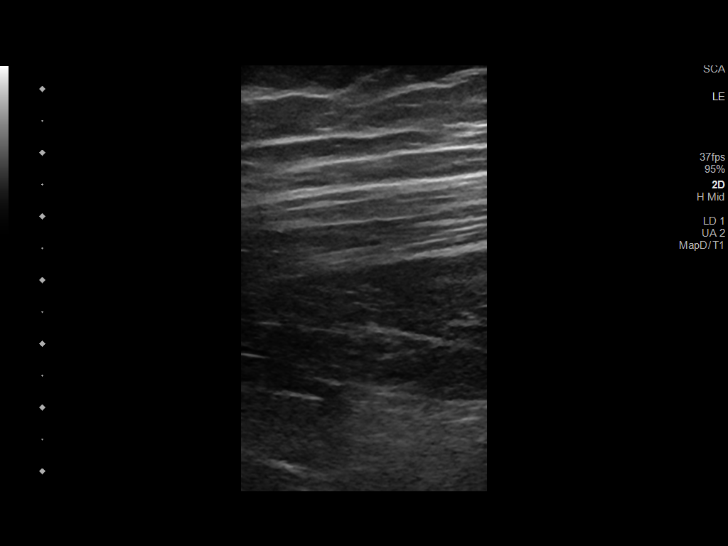
[im 22/42]
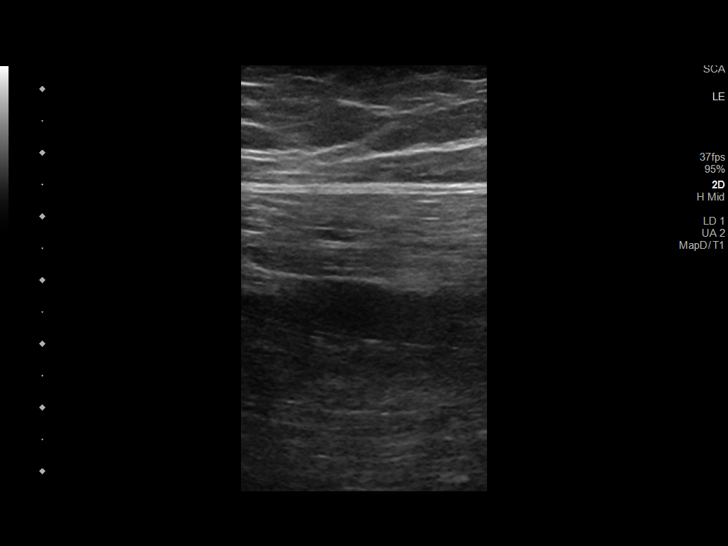
[im 24/42]
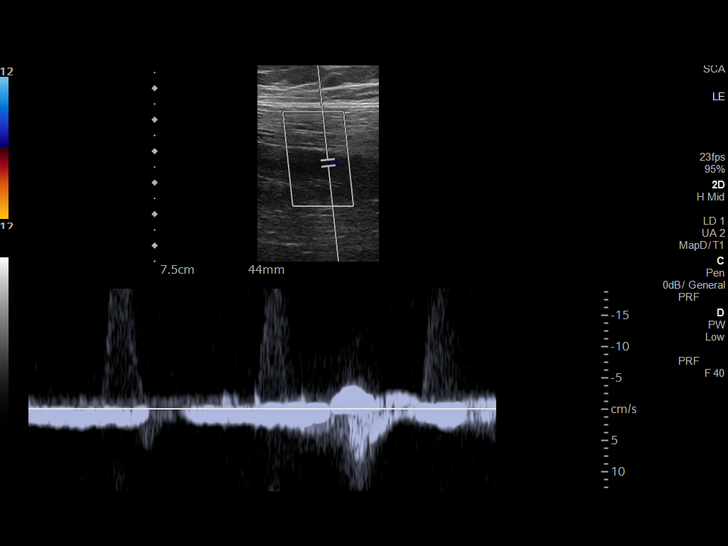
[im 27/42]
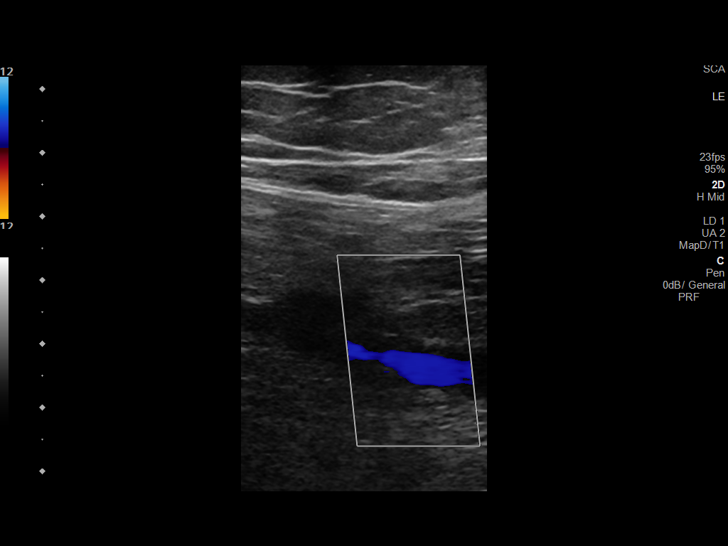
[im 31/42]
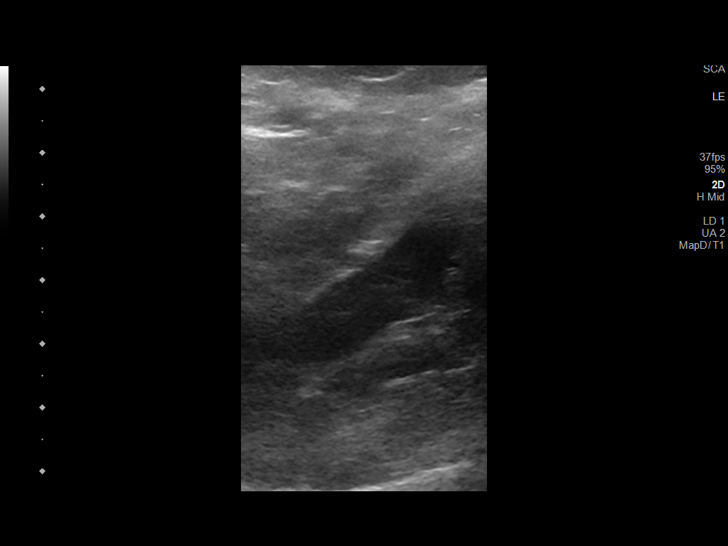
[im 34/42]
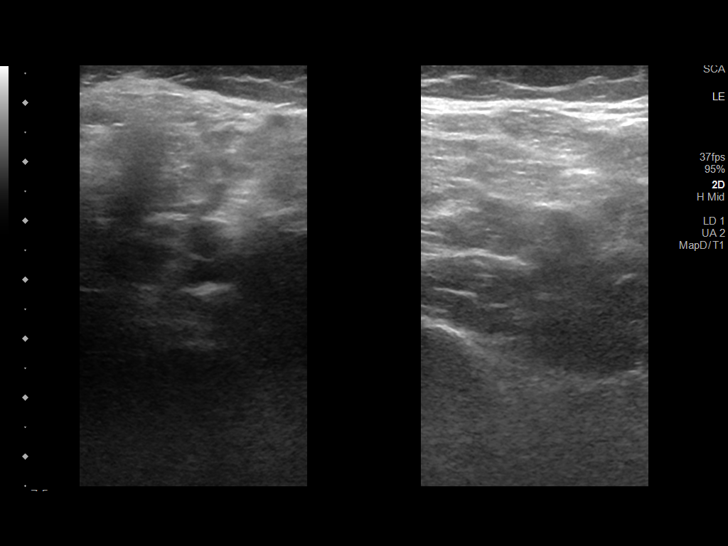
[im 38/42]
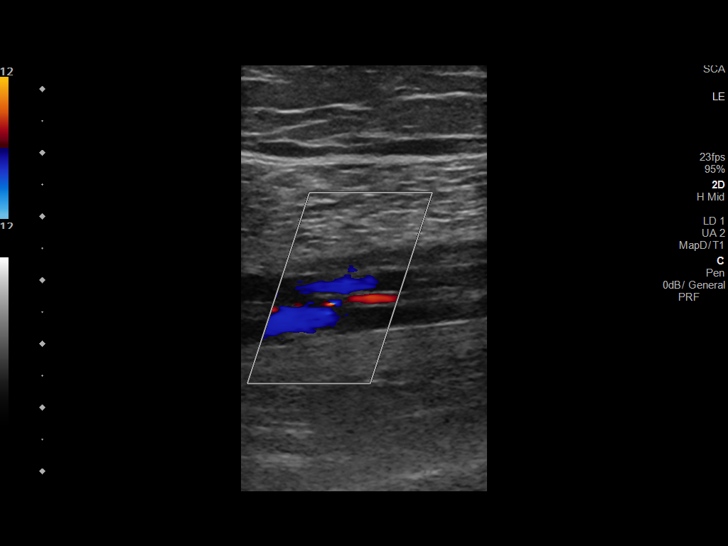
[im 42/42]
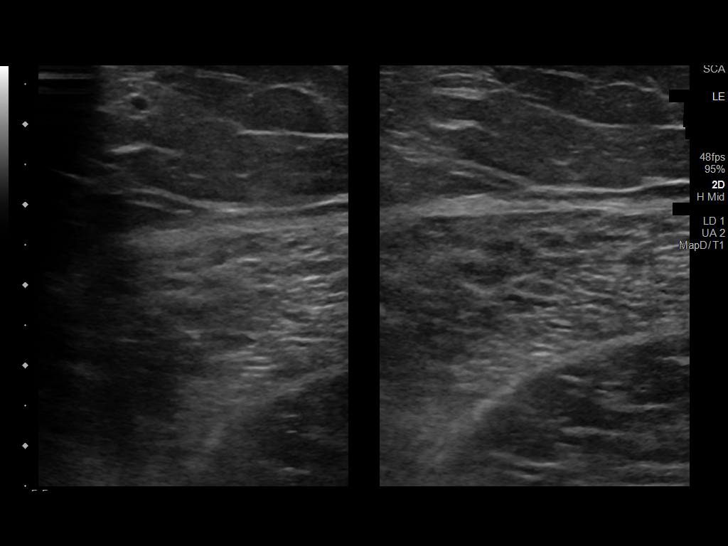

[13 of 24 positions shown; findings below may reference images not displayed]

FINDINGS: Contralateral Common Femoral Vein: Respiratory phasicity is normal
and symmetric with the symptomatic side. No evidence of thrombus.
Normal compressibility.

Common Femoral Vein: No evidence of thrombus. Normal
compressibility, respiratory phasicity and response to augmentation.

Saphenofemoral Junction: No evidence of thrombus. Normal
compressibility and flow on color Doppler imaging.

Profunda Femoral Vein: No evidence of thrombus. Normal
compressibility and flow on color Doppler imaging.

Femoral Vein: No evidence of thrombus. Normal compressibility,
respiratory phasicity and response to augmentation.

Popliteal Vein: No evidence of thrombus. Normal compressibility,
respiratory phasicity and response to augmentation.

Calf Veins: No evidence of thrombus. Normal compressibility and flow
on color Doppler imaging.

Superficial Great Saphenous Vein: No evidence of thrombus. Normal
compressibility.

Venous Reflux:  None.

Other Findings:  None.
IMPRESSION: No evidence of DVT within the left lower extremity.

## 2022-12-31 NOTE — Progress Notes (Signed)
Agree with the assessment and plan as outlined by Alcide Evener, NP.   Based on the recent diagnosis of uncomplicated sigmoid diverticulitis, I think it is reasonable to repeat colonoscopy now (8 weeks from completion of antibiotic course) for reevaluation rather than waiting.   Doristine Locks, DO, [facility]

## 2022-12-31 NOTE — Progress Notes (Signed)
Olivia Reyes, I called the patient and informed her of Dr. Frankey Shown recommendation to go ahead and schedule a colonoscopy at this juncture as it has been 2 months since she was treated for diverticulitis.  She is  aware you will contact her to schedule this procedure and for the bowel prep instructions.  Patient was informed to contact our office if her abdominal pain recurs prior to her colonoscopy date.  Thank you

## 2023-01-01 ENCOUNTER — Telehealth: Payer: Self-pay

## 2023-01-01 ENCOUNTER — Other Ambulatory Visit: Payer: Self-pay

## 2023-01-01 DIAGNOSIS — K5732 Diverticulitis of large intestine without perforation or abscess without bleeding: Secondary | ICD-10-CM

## 2023-01-01 DIAGNOSIS — K5792 Diverticulitis of intestine, part unspecified, without perforation or abscess without bleeding: Secondary | ICD-10-CM

## 2023-01-01 MED ORDER — CLENPIQ 10-3.5-12 MG-GM -GM/160ML PO SOLN
1.0000 | ORAL | 0 refills | Status: DC
Start: 2023-01-01 — End: 2023-03-28

## 2023-01-01 NOTE — Telephone Encounter (Signed)
Message Received: Percell Locus, Malachi Carl, NP  Emeline Darling, RN      Previous Messages  Routed Note  Author: Arnaldo Natal, NP Service: Gastroenterology Author Type: Nurse Practitioner  Filed: 12/31/2022  3:49 PM Encounter Date: 12/21/2022 Status: Signed  Editor: Arnaldo Natal, NP (Nurse Practitioner)   Viviann Spare, I called the patient and informed her of Dr. Frankey Shown recommendation to go ahead and schedule a colonoscopy at this juncture as it has been 2 months since she was treated for diverticulitis.  She is  aware you will contact her to schedule this procedure and for the bowel prep instructions.  Patient was informed to contact our office if her abdominal pain recurs prior to her colonoscopy date.  Thank you      Pt was made aware of Dr. Frankey Shown recommendations:  Pt was scheduled for a colonoscopy on 02/01/2023 at 4:00 PM. Pt was made aware: Prep sent to pt pharmacy: Pt made aware Prep instructions were sent to pt via My Chart. Pt made aware.  Ambulatory referral to GI placed. Pt verbalized understanding with all questions answered.

## 2023-01-01 NOTE — Telephone Encounter (Signed)
Pt made aware.  Documented in phone note.

## 2023-01-10 DIAGNOSIS — F411 Generalized anxiety disorder: Secondary | ICD-10-CM | POA: Diagnosis not present

## 2023-01-10 DIAGNOSIS — F331 Major depressive disorder, recurrent, moderate: Secondary | ICD-10-CM | POA: Diagnosis not present

## 2023-01-10 DIAGNOSIS — F4310 Post-traumatic stress disorder, unspecified: Secondary | ICD-10-CM | POA: Diagnosis not present

## 2023-01-21 ENCOUNTER — Other Ambulatory Visit (HOSPITAL_BASED_OUTPATIENT_CLINIC_OR_DEPARTMENT_OTHER): Payer: Self-pay

## 2023-01-22 ENCOUNTER — Encounter: Payer: Self-pay | Admitting: Gastroenterology

## 2023-01-25 ENCOUNTER — Other Ambulatory Visit: Payer: Self-pay

## 2023-01-25 DIAGNOSIS — R197 Diarrhea, unspecified: Secondary | ICD-10-CM

## 2023-01-25 DIAGNOSIS — Z8719 Personal history of other diseases of the digestive system: Secondary | ICD-10-CM

## 2023-01-25 DIAGNOSIS — R1032 Left lower quadrant pain: Secondary | ICD-10-CM

## 2023-01-25 MED ORDER — AMOXICILLIN-POT CLAVULANATE 875-125 MG PO TABS: 1.0000 | ORAL_TABLET | Freq: Two times a day (BID) | ORAL | 0 refills | Status: AC

## 2023-01-25 NOTE — Telephone Encounter (Signed)
Olivia Reyes, pls contact patient, verify if pain is LLQ. Stop fiber supplement. If her current pain is similar to the abd pain she had when diagnosed with diverticulitis then send in RX Augmentin 875 mg 1 tab p.o. twice daily for 10 days.  Florastor probiotic 1 p.o. twice daily.  Send her to our lab for CBC and CMP.  Schedule her for a follow-up office visit in one or two weeks. Cancel her colonoscopy next week.  Colonoscopy will be rescheduled the time of her follow-up appointment.  Please instruct the patient to go to the emergency room if she develops worsening abdominal pain over the extended holiday weekend.

## 2023-01-25 NOTE — Telephone Encounter (Signed)
Spoke with patient regarding NP recommendations. She does have LLQ pain & feels similar to when she had her diverticulitis flare. Says it is a constant ache. As of right now there are no follow up appointments available soon. She has confirmed that she can tentatively make an appointment on 9/9 if available. There is a 7 day hold. Will have Viviann Spare, RN call patient on Tuesday to book and confirm appointment with patient. She will come in for labs prior to then. Advised on when/where to go for labs. Prescription sent to pharmacy. Procedure cancelled. Pt to call back with any questions/concerns.

## 2023-01-29 ENCOUNTER — Telehealth: Payer: Self-pay

## 2023-01-29 NOTE — Telephone Encounter (Signed)
Pt was scheduled for an office visit on 02/04/2023 at 1:30 PM with Willette Cluster NP. Pt made aware.  Pt verbalized understanding with all questions answered.

## 2023-01-29 NOTE — Telephone Encounter (Signed)
Spoke with Pt.  Documented in phone note.

## 2023-01-31 ENCOUNTER — Other Ambulatory Visit (INDEPENDENT_AMBULATORY_CARE_PROVIDER_SITE_OTHER): Payer: BC Managed Care – PPO

## 2023-01-31 DIAGNOSIS — R197 Diarrhea, unspecified: Secondary | ICD-10-CM

## 2023-01-31 DIAGNOSIS — Z8719 Personal history of other diseases of the digestive system: Secondary | ICD-10-CM | POA: Diagnosis not present

## 2023-01-31 DIAGNOSIS — R1032 Left lower quadrant pain: Secondary | ICD-10-CM | POA: Diagnosis not present

## 2023-01-31 LAB — COMPREHENSIVE METABOLIC PANEL
ALT: 11 U/L (ref 0–35)
AST: 16 U/L (ref 0–37)
Albumin: 4.3 g/dL (ref 3.5–5.2)
Alkaline Phosphatase: 55 U/L (ref 39–117)
BUN: 12 mg/dL (ref 6–23)
CO2: 27 meq/L (ref 19–32)
Calcium: 9.7 mg/dL (ref 8.4–10.5)
Chloride: 105 meq/L (ref 96–112)
Creatinine, Ser: 0.89 mg/dL (ref 0.40–1.20)
GFR: 83.56 mL/min (ref >60.00)
Glucose, Bld: 87 mg/dL (ref 70–99)
Potassium: 4.5 meq/L (ref 3.5–5.1)
Sodium: 139 meq/L (ref 135–145)
Total Bilirubin: 0.9 mg/dL (ref 0.2–1.2)
Total Protein: 7.1 g/dL (ref 6.0–8.3)

## 2023-01-31 LAB — CBC
HCT: 40 % (ref 36.0–46.0)
Hemoglobin: 13.4 g/dL (ref 12.0–15.0)
MCHC: 33.4 g/dL (ref 30.0–36.0)
MCV: 90.3 fl (ref 78.0–100.0)
Platelets: 271 10*3/uL (ref 150.0–400.0)
RBC: 4.43 Mil/uL (ref 3.87–5.11)
RDW: 13.2 % (ref 11.5–15.5)
WBC: 5.9 10*3/uL (ref 4.0–10.5)

## 2023-01-31 NOTE — Telephone Encounter (Signed)
[name], as long as patient's abdominal pain is completely gone, ok to cancel 9/9 office visit and ok to reschedule her colonoscopy with Dr. Barron Alvine in 6 weeks. Patient to contact office if her abdominal pain recurs prior to her procedure date.  Olivia Reyes, patient had appt with you 9/9

## 2023-01-31 NOTE — Telephone Encounter (Signed)
Called & spoke with patient and she is no longer having any pain since starting antibiotics. Cancelled follow up & rescheduled colon for 03/19/23 at 2:30 pm. Updated instructions sent to patient. She's aware to call back sooner if symptoms return.

## 2023-02-01 ENCOUNTER — Encounter: Payer: BC Managed Care – PPO | Admitting: Gastroenterology

## 2023-02-04 ENCOUNTER — Ambulatory Visit: Payer: BC Managed Care – PPO | Admitting: Nurse Practitioner

## 2023-02-10 ENCOUNTER — Telehealth: Payer: BC Managed Care – PPO | Admitting: Family Medicine

## 2023-02-10 DIAGNOSIS — B3731 Acute candidiasis of vulva and vagina: Secondary | ICD-10-CM | POA: Diagnosis not present

## 2023-02-10 MED ORDER — FLUCONAZOLE 150 MG PO TABS: 150.0000 mg | ORAL_TABLET | ORAL | 0 refills | Status: DC

## 2023-02-10 NOTE — Progress Notes (Signed)

## 2023-02-11 NOTE — Telephone Encounter (Signed)
Called patient in reference to complaints about a possible yeast infection due to taking ABX as stated by the patient. No answer.I left a message for the patient to return my call.

## 2023-02-11 NOTE — Telephone Encounter (Addendum)
Received message from patient in reference to a yeast infection, called to discuss the provider as to who ordered the medication. What type of yeast infection? I left a message for the patient to return my call.

## 2023-02-11 NOTE — Telephone Encounter (Signed)
Patient had e-visit scheduled on 02/10/23. Patient was treated with Fluconazole medication for yeast infection.

## 2023-02-12 DIAGNOSIS — D229 Melanocytic nevi, unspecified: Secondary | ICD-10-CM | POA: Diagnosis not present

## 2023-02-12 DIAGNOSIS — L814 Other melanin hyperpigmentation: Secondary | ICD-10-CM | POA: Diagnosis not present

## 2023-02-12 DIAGNOSIS — L578 Other skin changes due to chronic exposure to nonionizing radiation: Secondary | ICD-10-CM | POA: Diagnosis not present

## 2023-02-12 DIAGNOSIS — L821 Other seborrheic keratosis: Secondary | ICD-10-CM | POA: Diagnosis not present

## 2023-02-26 ENCOUNTER — Other Ambulatory Visit: Payer: Self-pay | Admitting: Obstetrics and Gynecology

## 2023-02-26 DIAGNOSIS — Z124 Encounter for screening for malignant neoplasm of cervix: Secondary | ICD-10-CM | POA: Diagnosis not present

## 2023-02-26 DIAGNOSIS — Z6829 Body mass index (BMI) 29.0-29.9, adult: Secondary | ICD-10-CM | POA: Diagnosis not present

## 2023-02-26 DIAGNOSIS — N6320 Unspecified lump in the left breast, unspecified quadrant: Secondary | ICD-10-CM

## 2023-02-26 DIAGNOSIS — Z01419 Encounter for gynecological examination (general) (routine) without abnormal findings: Secondary | ICD-10-CM | POA: Diagnosis not present

## 2023-03-07 DIAGNOSIS — N6002 Solitary cyst of left breast: Secondary | ICD-10-CM | POA: Diagnosis not present

## 2023-03-07 DIAGNOSIS — F4310 Post-traumatic stress disorder, unspecified: Secondary | ICD-10-CM | POA: Diagnosis not present

## 2023-03-07 DIAGNOSIS — F331 Major depressive disorder, recurrent, moderate: Secondary | ICD-10-CM | POA: Diagnosis not present

## 2023-03-07 DIAGNOSIS — F411 Generalized anxiety disorder: Secondary | ICD-10-CM | POA: Diagnosis not present

## 2023-03-19 ENCOUNTER — Encounter: Payer: BC Managed Care – PPO | Admitting: Gastroenterology

## 2023-03-26 ENCOUNTER — Other Ambulatory Visit: Payer: BC Managed Care – PPO

## 2023-03-28 ENCOUNTER — Telehealth: Payer: Self-pay | Admitting: Gastroenterology

## 2023-03-28 ENCOUNTER — Telehealth: Payer: Self-pay | Admitting: Nurse Practitioner

## 2023-03-28 DIAGNOSIS — K5792 Diverticulitis of intestine, part unspecified, without perforation or abscess without bleeding: Secondary | ICD-10-CM

## 2023-03-28 DIAGNOSIS — K5732 Diverticulitis of large intestine without perforation or abscess without bleeding: Secondary | ICD-10-CM

## 2023-03-28 MED ORDER — CLENPIQ 10-3.5-12 MG-GM -GM/160ML PO SOLN: 1.0000 | ORAL | 0 refills | Status: DC

## 2023-03-28 NOTE — Telephone Encounter (Signed)
Inbound call from patient stating she need updated prep medication instructions for tomorrow's  colonoscopy sent through mychart. Please advise, thank you.

## 2023-03-28 NOTE — Telephone Encounter (Signed)
Returned call to patient. No answer, left voicemail for patient stating that updated prep instructions were provided via My chart. Encouraged patient to call back if she has any questions or problems.Clenpiq Rx resend to her pharmacy.

## 2023-03-28 NOTE — Telephone Encounter (Signed)
Error

## 2023-03-29 ENCOUNTER — Encounter: Payer: Self-pay | Admitting: Gastroenterology

## 2023-03-29 ENCOUNTER — Telehealth: Payer: Self-pay

## 2023-03-29 ENCOUNTER — Ambulatory Visit (AMBULATORY_SURGERY_CENTER): Payer: BC Managed Care – PPO | Admitting: Gastroenterology

## 2023-03-29 VITALS — BP 100/66 | HR 48 | Temp 98.2°F | Resp 13 | Ht 71.0 in | Wt 208.0 lb

## 2023-03-29 DIAGNOSIS — K5730 Diverticulosis of large intestine without perforation or abscess without bleeding: Secondary | ICD-10-CM | POA: Diagnosis not present

## 2023-03-29 DIAGNOSIS — K921 Melena: Secondary | ICD-10-CM | POA: Diagnosis not present

## 2023-03-29 DIAGNOSIS — Z8719 Personal history of other diseases of the digestive system: Secondary | ICD-10-CM | POA: Diagnosis not present

## 2023-03-29 DIAGNOSIS — Z860100 Personal history of colon polyps, unspecified: Secondary | ICD-10-CM

## 2023-03-29 MED ORDER — SODIUM CHLORIDE 0.9 % IV SOLN
500.0000 mL | INTRAVENOUS | Status: DC
Start: 2023-03-29 — End: 2023-03-29

## 2023-03-29 MED ORDER — LINACLOTIDE 72 MCG PO CAPS: 72.0000 ug | ORAL_CAPSULE | Freq: Every day | ORAL | 1 refills | Status: DC

## 2023-03-29 NOTE — Progress Notes (Signed)
Vss nad trans to pacu 

## 2023-03-29 NOTE — Patient Instructions (Addendum)
Resume all of your previous medications as ordered.  Read the handouts given to you by your recovery room nurse.  Try to take Citrucel, benefiber, or metamucil.  Try to drink 64 oz of water per day.  YOU HAD AN ENDOSCOPIC PROCEDURE TODAY AT THE [facility]:   Refer to the procedure report that was given to you for any specific questions about what was found during the examination.  If the procedure report does not answer your questions, please call your gastroenterologist to clarify.  If you requested that your care partner not be given the details of your procedure findings, then the procedure report has been included in a sealed envelope for you to review at your convenience later.  YOU SHOULD EXPECT: Some feelings of bloating in the abdomen. Passage of more gas than usual.  Walking can help get rid of the air that was put into your GI tract during the procedure and reduce the bloating. If you had a lower endoscopy (such as a colonoscopy or flexible sigmoidoscopy) you may notice spotting of blood in your stool or on the toilet paper. If you underwent a bowel prep for your procedure, you may not have a normal bowel movement for a few days.  Please Note:  You might notice some irritation and congestion in your nose or some drainage.  This is from the oxygen used during your procedure.  There is no need for concern and it should clear up in a day or so.  SYMPTOMS TO REPORT IMMEDIATELY:  Following lower endoscopy (colonoscopy or flexible sigmoidoscopy):  Excessive amounts of blood in the stool  Significant tenderness or worsening of abdominal pains  Swelling of the abdomen that is new, acute  Fever of 100F or higher   For urgent or emergent issues, a gastroenterologist can be reached at any hour by calling [phone]. Do not use MyChart messaging for urgent concerns.    DIET:  We do recommend a small meal at first, but then you may proceed to your regular diet.  Drink plenty of  fluids but you should avoid alcoholic beverages for 24 hours.  ACTIVITY:  You should plan to take it easy for the rest of today and you should NOT DRIVE or use heavy machinery until tomorrow (because of the sedation medicines used during the test).    FOLLOW UP: Our staff will call the number listed on your records the next business day following your procedure.  We will call around 7:15- 8:00 am to check on you and address any questions or concerns that you may have regarding the information given to you following your procedure. If we do not reach you, we will leave a message.      SIGNATURES/CONFIDENTIALITY: You and/or your care partner have signed paperwork which will be entered into your electronic medical record.  These signatures attest to the fact that that the information above on your After Visit Summary has been reviewed and is understood.  Full responsibility of the confidentiality of this discharge information lies with you and/or your care-partner.

## 2023-03-29 NOTE — Telephone Encounter (Signed)
Rx for Linzess 72 mcg sent to pharmacy.

## 2023-03-29 NOTE — Telephone Encounter (Signed)
-----   Message from Shellia Cleverly sent at 03/29/2023 12:06 PM EDT ----- Can you please send in an Rx for Linzess 72 mcg/day for this patient. #60, RF1 to start. Thanks!

## 2023-03-29 NOTE — Op Note (Signed)
[facility] Patient Name: Olivia Reyes Procedure Date: 03/29/2023 11:08 AM MRN: [id number] Endoscopist: Doristine Locks , MD, [id number] Age: 37 Referring MD:  Date of Birth: [birth year] Gender: Female Account #: [id number] Procedure:                Colonoscopy Indications:              Hematochezia, Follow-up of diverticulitis                           Last colonoscopy was 02/12/2020 and notable for 3                            subcentimeter polyps (tubular adenomas x 1, HP x                            2), sigmoid diverticulosis, and small internal                            hemorrhoids. She then developed acute                            diverticulitis in 11/2022, treated with Augmentin x                            10 days with clinical improvement. Then had                            recurrence of acute diveritculitis in September,                            again responsive to antibiotic course. She presents                            today for endoscopic evaluation. Medicines:                Monitored Anesthesia Care Procedure:                Pre-Anesthesia Assessment:                           - Prior to the procedure, a History and Physical                            was performed, and patient medications and                            allergies were reviewed. The patient's tolerance of                            previous anesthesia was also reviewed. The risks                            and benefits of the procedure and the sedation  options and risks were discussed with the patient.                            All questions were answered, and informed consent                            was obtained. Prior Anticoagulants: The patient has                            taken no anticoagulant or antiplatelet agents. ASA                            Grade Assessment: II - A patient with mild systemic                            disease. After reviewing the  risks and benefits,                            the patient was deemed in satisfactory condition to                            undergo the procedure.                           After obtaining informed consent, the colonoscope                            was passed under direct vision. Throughout the                            procedure, the patient's blood pressure, pulse, and                            oxygen saturations were monitored continuously. The                            Olympus Scope [id number] was introduced through the                            anus and advanced to the the terminal ileum. The                            colonoscopy was performed without difficulty. The                            patient tolerated the procedure well. The quality                            of the bowel preparation was good. The terminal                            ileum, ileocecal valve, appendiceal orifice, and  rectum were photographed. Scope In: 11:31:13 AM Scope Out: 11:52:49 AM Scope Withdrawal Time: 0 hours 14 minutes 22 seconds  Total Procedure Duration: 0 hours 21 minutes 36 seconds  Findings:                 The perianal and digital rectal examinations were                            normal.                           A few small-mouthed diverticula were found in the                            sigmoid colon. Traversed this area several times                            with irrigation of each of the diverticula.                           The exam was otherwise normal throughout the                            remainder of the colon.                           The retroflexed view of the distal rectum and anal                            verge was normal and showed no anal or rectal                            abnormalities.                           The terminal ileum appeared normal. Complications:            No immediate complications. Estimated Blood Loss:      Estimated blood loss: none. Impression:               - Diverticulosis in the sigmoid colon.                           - The distal rectum and anal verge are normal on                            retroflexion view.                           - The examined portion of the ileum was normal.                           - No specimens collected. Recommendation:           - Patient has a contact number available for  emergencies. The signs and symptoms of potential                            delayed complications were discussed with the                            patient. Return to normal activities tomorrow.                            Written discharge instructions were provided to the                            patient.                           - Resume previous diet.                           - Continue present medications.                           - Repeat colonoscopy in 5 years for surveillance.                           - Return to GI office PRN.                           - Continue fiber, for example Citrucel or Benefiber.                           - Continue hydration with at least 64 oz of water                            daily. Doristine Locks, MD 03/29/2023 12:00:44 PM

## 2023-03-29 NOTE — Progress Notes (Signed)
GASTROENTEROLOGY PROCEDURE H&P NOTE   Primary Care Physician: Pearline Cables, MD    Reason for Procedure:   Diverticulitis, history of colon polyps  Plan:    Colonoscopy  Patient is appropriate for endoscopic procedure(s) in the ambulatory ([facility]) setting.  The nature of the procedure, as well as the risks, benefits, and alternatives were carefully and thoroughly reviewed with the patient. Ample time for discussion and questions allowed. The patient understood, was satisfied, and agreed to proceed.     HPI: Olivia Reyes is a 37 y.o. female who presents for colonoscopy for evaluation of recent acute uncomplicated diverticulitis along with known history of colon polyps.  Last colonoscopy was 02/12/2020 and notable for 3 subcentimeter polyps (tubular adenomas x 1, HP x 2), sigmoid diverticulosis, and small internal hemorrhoids.  She then developed acute diverticulitis in 11/2022, treated with Augmentin x 10 days with clinical improvement.  Does have a known history of anal fissures and chronic constipation, treating the latter with MiraLAX, hydration, dietary modification.    Past Medical History:  Diagnosis Date   Anxiety    Chicken pox    Complication of anesthesia    Prolonged sedation from anesthesia   Depression    Family history of adverse reaction to anesthesia    family has prolonged sedation issues   HSV infection    Left ovarian cyst 03/2013   3.8 cm minimally complex left ovarian cyst   moderate 07/01/2018   right low paraspinal, right upper abdomen   Obesity    Pelvic adhesions    Possible   Pelvic pain    Shingles 2019   Strep throat    Tonsil stone 2019    Past Surgical History:  Procedure Laterality Date   CHOLECYSTECTOMY  2013   COLONOSCOPY     DILITATION & CURRETTAGE/HYSTROSCOPY WITH HYDROTHERMAL ABLATION N/A 10/31/2018   Procedure: DILATATION & CURETTAGE/HYSTEROSCOPY WITH attempted HYDROTHERMAL ABLATION;  Surgeon: Marcelle Overlie, MD;   Location: [facility];  Service: Gynecology;  Laterality: N/A;   HAND SURGERY Left 2019   HYSTERECTOMY ABDOMINAL WITH SALPINGO-OOPHORECTOMY Bilateral 11/27/2018   Procedure: HYSTERECTOMY ABDOMINAL WITH BILATERAL SALPINGECTOMY;  Surgeon: Marcelle Overlie, MD;  Location: [facility] OR;  Service: Gynecology;  Laterality: Bilateral;   LAPAROSCOPIC TUBAL LIGATION Bilateral 10/31/2018   Procedure: attemted LAPAROSCOPIC TUBAL LIGATION;  Surgeon: Marcelle Overlie, MD;  Location: [facility];  Service: Gynecology;  Laterality: Bilateral;   WISDOM TOOTH EXTRACTION     WOUND EXPLORATION Left 09/12/2017   Procedure: LEFT HAND EXPLORATION;  Surgeon: Betha Loa, MD;  Location: [facility];  Service: Orthopedics;  Laterality: Left;    Prior to Admission medications   Medication Sig Start Date End Date Taking? Authorizing Provider  buPROPion (WELLBUTRIN XL) 150 MG 24 hr tablet Take 150 mg by mouth daily.   Yes [provider]  buPROPion (WELLBUTRIN XL) 150 MG 24 hr tablet Take 1 tablet (150 mg total) by mouth every morning. For depression 09/15/22  Yes   methylphenidate (METADATE CD) 20 MG CR capsule Take 1 capsule(s) by mouth every morning for ADHD 10/24/22  Yes   clonazePAM (KLONOPIN) 0.5 MG tablet Take 0.5-1 tablets (0.25-0.5 mg total) by mouth 2 (two) times daily as needed for anxiety/panic attacks 09/15/22     fluconazole (DIFLUCAN) 150 MG tablet Take 1 tablet (150 mg total) by mouth as directed. Repeat in 1 week if needed 02/10/23   Delorse Lek, FNP  rizatriptan (MAXALT-MLT) 5 MG disintegrating tablet Take  5 mg by mouth as needed for migraine. May repeat in 2 hours if needed    [provider]  triamterene-hydrochlorothiazide (MAXZIDE) 75-50 MG tablet Take 1 tablet by mouth daily.    [provider]  valACYclovir (VALTREX) 1000 MG tablet TAKE 1/2 TABLET (500 MG TOTAL) BY MOUTH DAILY. 06/14/20   Ginnie Smart, MD    Current Outpatient  Medications  Medication Sig Dispense Refill   buPROPion (WELLBUTRIN XL) 150 MG 24 hr tablet Take 150 mg by mouth daily.     buPROPion (WELLBUTRIN XL) 150 MG 24 hr tablet Take 1 tablet (150 mg total) by mouth every morning. For depression 90 tablet 0   methylphenidate (METADATE CD) 20 MG CR capsule Take 1 capsule(s) by mouth every morning for ADHD 30 capsule 0   clonazePAM (KLONOPIN) 0.5 MG tablet Take 0.5-1 tablets (0.25-0.5 mg total) by mouth 2 (two) times daily as needed for anxiety/panic attacks 60 tablet 1   fluconazole (DIFLUCAN) 150 MG tablet Take 1 tablet (150 mg total) by mouth as directed. Repeat in 1 week if needed 2 tablet 0   rizatriptan (MAXALT-MLT) 5 MG disintegrating tablet Take 5 mg by mouth as needed for migraine. May repeat in 2 hours if needed     triamterene-hydrochlorothiazide (MAXZIDE) 75-50 MG tablet Take 1 tablet by mouth daily.     valACYclovir (VALTREX) 1000 MG tablet TAKE 1/2 TABLET (500 MG TOTAL) BY MOUTH DAILY. 45 tablet 7   Current Facility-Administered Medications  Medication Dose Route Frequency Provider Last Rate Last Admin   0.9 %  sodium chloride infusion  500 mL Intravenous Continuous Alpheus Stiff V, DO        Allergies as of 03/29/2023   (No Known Allergies)    Family History  Problem Relation Age of Onset   Hypertension Mother    Healthy Sister        x1   Stroke Maternal Aunt    Arthritis Maternal Grandmother    Hyperlipidemia Maternal Grandmother    Hypertension Maternal Grandmother    Throat cancer Maternal Grandfather    Healthy Daughter        x1   Colon cancer Neg Hx    Rectal cancer Neg Hx    Colon polyps Neg Hx    Pancreatic cancer Neg Hx    Stomach cancer Neg Hx     Social History   Socioeconomic History   Marital status: Married    Spouse name: Not on file   Number of children: Not on file   Years of education: Not on file   Highest education level: Not on file  Occupational History   Not on file  Tobacco Use    Smoking status: Former    Current packs/day: 0.00    Types: Cigarettes    Quit date: 07/27/2012    Years since quitting: 10.6   Smokeless tobacco: Never  Vaping Use   Vaping status: Former   Quit date: 05/11/2020  Substance and Sexual Activity   Alcohol use: Yes    Alcohol/week: 1.0 standard drink of alcohol    Types: 1 Standard drinks or equivalent per week    Comment: rarely   Drug use: No   Sexual activity: Yes    Birth control/protection: None    Comment: hysterectomy  Other Topics Concern   Not on file  Social History Narrative   Not on file   Social Determinants of Health   Financial Resource Strain: Not on file  Food Insecurity:  Not on file  Transportation Needs: Not on file  Physical Activity: Not on file  Stress: Not on file  Social Connections: Not on file  Intimate Partner Violence: Not on file    Physical Exam: Vital signs in last 24 hours: @BP  119/69   Pulse (!) 55   Temp 98.2 F (36.8 C)   Ht 5\' 11"  (1.803 m)   Wt 208 lb (94.3 kg)   LMP 06/19/2013   SpO2 99%   BMI 29.01 kg/m  GEN: NAD EYE: Sclerae anicteric ENT: MMM CV: Non-tachycardic Pulm: CTA b/l GI: Soft, NT/ND NEURO:  Alert & Oriented x 3   Doristine Locks, DO [facility]   03/29/2023 11:14 AM

## 2023-04-01 ENCOUNTER — Telehealth: Payer: Self-pay | Admitting: *Deleted

## 2023-04-01 NOTE — Telephone Encounter (Signed)
  Follow up Call-     03/29/2023   10:59 AM  Call back number  Post procedure Call Back phone  # [phone]  Permission to leave phone message Yes     Patient questions:   Message left to call if necessary.

## 2023-04-05 DIAGNOSIS — F4310 Post-traumatic stress disorder, unspecified: Secondary | ICD-10-CM | POA: Diagnosis not present

## 2023-04-05 DIAGNOSIS — F411 Generalized anxiety disorder: Secondary | ICD-10-CM | POA: Diagnosis not present

## 2023-04-05 DIAGNOSIS — F331 Major depressive disorder, recurrent, moderate: Secondary | ICD-10-CM | POA: Diagnosis not present

## 2023-06-02 NOTE — Progress Notes (Deleted)
 [facility] [address] [place], [place] [address] [phone]  Date:  06/06/2023   Name:  Olivia Reyes   DOB:  03/30/1986   MRN:  [id number]  PCP:  Watt Harlene BROCKS, MD    Chief Complaint: No chief complaint on file.   History of Present Illness:  Olivia Reyes is a 38 y.o. very pleasant female patient who presents with the following:  Pt seen today for a CPE Last seen by myself 4/22- History of HTN, HSV, obesity  She had a hysterectomy in 2020 due to pelvic pain and prolapse  History of pre-cancerous polyps seen on colonoscopy  Flu vaccine Covid booster Pap Colonoscopy follow-up completed in November   Patient Active Problem List   Diagnosis Date Noted   Diverticulitis of colon 12/21/2022   Viral URI 11/18/2020   Fibrocystic breast disease (FCBD) 09/14/2020   Chronic tonsillitis 03/01/2020   Dysthymic disorder 10/26/2019   Pelvic pain 11/27/2018   S/P TAH (total abdominal hysterectomy) 11/27/2018   HSV (herpes simplex virus) infection 02/07/2018   Tonsil stone 11/05/2017   Snoring 11/05/2017   Rash and nonspecific skin eruption 11/15/2015   Visit for preventive health examination 11/11/2015   Obesity 11/11/2015   Shingles rash 05/12/2014   Anxiety 07/27/2013   Abnormal Pap smear of cervix 07/27/2013    Past Medical History:  Diagnosis Date   Anxiety    Chicken pox    Complication of anesthesia    Prolonged sedation from anesthesia   Depression    Family history of adverse reaction to anesthesia    family has prolonged sedation issues   HSV infection    Left ovarian cyst 03/2013   3.8 cm minimally complex left ovarian cyst   moderate 07/01/2018   right low paraspinal, right upper abdomen   Obesity    Pelvic adhesions    Possible   Pelvic pain    Shingles 2019   Strep throat    Tonsil stone 2019    Past Surgical History:  Procedure Laterality Date   CHOLECYSTECTOMY  2013    COLONOSCOPY     DILITATION & CURRETTAGE/HYSTROSCOPY WITH HYDROTHERMAL ABLATION N/A 10/31/2018   Procedure: DILATATION & CURETTAGE/HYSTEROSCOPY WITH attempted HYDROTHERMAL ABLATION;  Surgeon: Mat Browning, MD;  Location: [facility];  Service: Gynecology;  Laterality: N/A;   HAND SURGERY Left 2019   HYSTERECTOMY ABDOMINAL WITH SALPINGO-OOPHORECTOMY Bilateral 11/27/2018   Procedure: HYSTERECTOMY ABDOMINAL WITH BILATERAL SALPINGECTOMY;  Surgeon: Mat Browning, MD;  Location: [facility];  Service: Gynecology;  Laterality: Bilateral;   LAPAROSCOPIC TUBAL LIGATION Bilateral 10/31/2018   Procedure: attemted LAPAROSCOPIC TUBAL LIGATION;  Surgeon: Mat Browning, MD;  Location: [facility];  Service: Gynecology;  Laterality: Bilateral;   WISDOM TOOTH EXTRACTION     WOUND EXPLORATION Left 09/12/2017   Procedure: LEFT HAND EXPLORATION;  Surgeon: Murrell Drivers, MD;  Location: [facility];  Service: Orthopedics;  Laterality: Left;    Social History   Tobacco Use   Smoking status: Former    Current packs/day: 0.00    Types: Cigarettes    Quit date: 07/27/2012    Years since quitting: 10.8   Smokeless tobacco: Never  Vaping Use   Vaping status: Former   Quit date: 05/11/2020  Substance Use Topics   Alcohol use: Yes    Alcohol/week: 1.0 standard drink of alcohol    Types: 1 Standard drinks or equivalent per week  Comment: rarely   Drug use: No    Family History  Problem Relation Age of Onset   Hypertension Mother    Healthy Sister        x1   Stroke Maternal Aunt    Arthritis Maternal Grandmother    Hyperlipidemia Maternal Grandmother    Hypertension Maternal Grandmother    Throat cancer Maternal Grandfather    Healthy Daughter        x1   Colon cancer Neg Hx    Rectal cancer Neg Hx    Colon polyps Neg Hx    Pancreatic cancer Neg Hx    Stomach cancer Neg Hx     No Known Allergies  Medication list has been reviewed and  updated.  Current Outpatient Medications on File Prior to Visit  Medication Sig Dispense Refill   buPROPion  (WELLBUTRIN  XL) 150 MG 24 hr tablet Take 150 mg by mouth daily.     buPROPion  (WELLBUTRIN  XL) 150 MG 24 hr tablet Take 1 tablet (150 mg total) by mouth every morning. For depression 90 tablet 0   clonazePAM  (KLONOPIN ) 0.5 MG tablet Take 0.5-1 tablets (0.25-0.5 mg total) by mouth 2 (two) times daily as needed for anxiety/panic attacks 60 tablet 1   fluconazole  (DIFLUCAN ) 150 MG tablet Take 1 tablet (150 mg total) by mouth as directed. Repeat in 1 week if needed 2 tablet 0   linaclotide  (LINZESS ) 72 MCG capsule Take 1 capsule (72 mcg total) by mouth daily before breakfast. 30 capsule 1   methylphenidate  (METADATE  CD) 20 MG CR capsule Take 1 capsule(s) by mouth every morning for ADHD 30 capsule 0   rizatriptan (MAXALT-MLT) 5 MG disintegrating tablet Take 5 mg by mouth as needed for migraine. May repeat in 2 hours if needed     triamterene-hydrochlorothiazide (MAXZIDE) 75-50 MG tablet Take 1 tablet by mouth daily.     valACYclovir  (VALTREX ) 1000 MG tablet TAKE 1/2 TABLET (500 MG TOTAL) BY MOUTH DAILY. 45 tablet 7   No current facility-administered medications on file prior to visit.    Review of Systems:  As per HPI- otherwise negative.   Physical Examination: There were no vitals filed for this visit. There were no vitals filed for this visit. There is no height or weight on file to calculate BMI. Ideal Body Weight:    GEN: no acute distress. HEENT: Atraumatic, Normocephalic.  Ears and Nose: No external deformity. CV: RRR, No M/G/R. No JVD. No thrill. No extra heart sounds. PULM: CTA B, no wheezes, crackles, rhonchi. No retractions. No resp. distress. No accessory muscle use. ABD: S, NT, ND, +BS. No rebound. No HSM. EXTR: No c/c/e PSYCH: Normally interactive. Conversant.    Assessment and Plan: *** Physical exam- encouraged healthy diet and exercise routine    Signed Harlene Schroeder, MD

## 2023-06-06 ENCOUNTER — Encounter: Payer: BC Managed Care – PPO | Admitting: Family Medicine

## 2023-06-06 DIAGNOSIS — Z1329 Encounter for screening for other suspected endocrine disorder: Secondary | ICD-10-CM

## 2023-06-06 DIAGNOSIS — Z131 Encounter for screening for diabetes mellitus: Secondary | ICD-10-CM

## 2023-06-06 DIAGNOSIS — Z0000 Encounter for general adult medical examination without abnormal findings: Secondary | ICD-10-CM

## 2023-06-06 DIAGNOSIS — Z13220 Encounter for screening for lipoid disorders: Secondary | ICD-10-CM

## 2023-06-15 NOTE — Progress Notes (Deleted)
[facility] [address] [place], [place] [address] [phone]  Date:  06/17/2023   Name:  Olivia Reyes   DOB:  11/15/1985   MRN:  [id number]  PCP:  Pearline Cables, MD    Chief Complaint: No chief complaint on file.   History of Present Illness:  Olivia Reyes is a 38 y.o. very pleasant female patient who presents with the following:  Pt seen today for a CPE Last seen by myself 4/22- History of HTN, HSV, obesity  She had a hysterectomy in 2020 due to pelvic pain and prolapse  History of pre-cancerous polyps seen on colonoscopy  Wellbutrin Clonazepam as needed Linzess Methylphenidate for ADHD  Flu vaccine Covid booster Pap Colonoscopy follow-up completed in November   Patient Active Problem List   Diagnosis Date Noted   Diverticulitis of colon 12/21/2022   Viral URI 11/18/2020   Fibrocystic breast disease (FCBD) 09/14/2020   Chronic tonsillitis 03/01/2020   Dysthymic disorder 10/26/2019   Pelvic pain 11/27/2018   S/P TAH (total abdominal hysterectomy) 11/27/2018   HSV (herpes simplex virus) infection 02/07/2018   Tonsil stone 11/05/2017   Snoring 11/05/2017   Rash and nonspecific skin eruption 11/15/2015   Visit for preventive health examination 11/11/2015   Obesity 11/11/2015   Shingles rash 05/12/2014   Anxiety 07/27/2013   Abnormal Pap smear of cervix 07/27/2013    Past Medical History:  Diagnosis Date   Anxiety    Chicken pox    Complication of anesthesia    Prolonged sedation from anesthesia   Depression    Family history of adverse reaction to anesthesia    family has prolonged sedation issues   HSV infection    Left ovarian cyst 03/2013   3.8 cm minimally complex left ovarian cyst   moderate 07/01/2018   right low paraspinal, right upper abdomen   Obesity    Pelvic adhesions    Possible   Pelvic pain    Shingles 2019   Strep throat    Tonsil stone 2019    Past  Surgical History:  Procedure Laterality Date   CHOLECYSTECTOMY  2013   COLONOSCOPY     DILITATION & CURRETTAGE/HYSTROSCOPY WITH HYDROTHERMAL ABLATION N/A 10/31/2018   Procedure: DILATATION & CURETTAGE/HYSTEROSCOPY WITH attempted HYDROTHERMAL ABLATION;  Surgeon: Marcelle Overlie, MD;  Location: [facility];  Service: Gynecology;  Laterality: N/A;   HAND SURGERY Left 2019   HYSTERECTOMY ABDOMINAL WITH SALPINGO-OOPHORECTOMY Bilateral 11/27/2018   Procedure: HYSTERECTOMY ABDOMINAL WITH BILATERAL SALPINGECTOMY;  Surgeon: Marcelle Overlie, MD;  Location: [facility];  Service: Gynecology;  Laterality: Bilateral;   LAPAROSCOPIC TUBAL LIGATION Bilateral 10/31/2018   Procedure: attemted LAPAROSCOPIC TUBAL LIGATION;  Surgeon: Marcelle Overlie, MD;  Location: [facility];  Service: Gynecology;  Laterality: Bilateral;   WISDOM TOOTH EXTRACTION     WOUND EXPLORATION Left 09/12/2017   Procedure: LEFT HAND EXPLORATION;  Surgeon: Betha Loa, MD;  Location: [facility];  Service: Orthopedics;  Laterality: Left;    Social History   Tobacco Use   Smoking status: Former    Current packs/day: 0.00    Types: Cigarettes    Quit date: 07/27/2012    Years since quitting: 10.8   Smokeless tobacco: Never  Vaping Use   Vaping status: Former   Quit date: 05/11/2020  Substance Use Topics   Alcohol use: Yes    Alcohol/week: 1.0 standard drink of alcohol  Types: 1 Standard drinks or equivalent per week    Comment: rarely   Drug use: No    Family History  Problem Relation Age of Onset   Hypertension Mother    Healthy Sister        x1   Stroke Maternal Aunt    Arthritis Maternal Grandmother    Hyperlipidemia Maternal Grandmother    Hypertension Maternal Grandmother    Throat cancer Maternal Grandfather    Healthy Daughter        x1   Colon cancer Neg Hx    Rectal cancer Neg Hx    Colon polyps Neg Hx    Pancreatic cancer Neg Hx    Stomach cancer Neg Hx      No Known Allergies  Medication list has been reviewed and updated.  Current Outpatient Medications on File Prior to Visit  Medication Sig Dispense Refill   buPROPion (WELLBUTRIN XL) 150 MG 24 hr tablet Take 150 mg by mouth daily.     buPROPion (WELLBUTRIN XL) 150 MG 24 hr tablet Take 1 tablet (150 mg total) by mouth every morning. For depression 90 tablet 0   clonazePAM (KLONOPIN) 0.5 MG tablet Take 0.5-1 tablets (0.25-0.5 mg total) by mouth 2 (two) times daily as needed for anxiety/panic attacks 60 tablet 1   fluconazole (DIFLUCAN) 150 MG tablet Take 1 tablet (150 mg total) by mouth as directed. Repeat in 1 week if needed 2 tablet 0   linaclotide (LINZESS) 72 MCG capsule Take 1 capsule (72 mcg total) by mouth daily before breakfast. 30 capsule 1   methylphenidate (METADATE CD) 20 MG CR capsule Take 1 capsule(s) by mouth every morning for ADHD 30 capsule 0   rizatriptan (MAXALT-MLT) 5 MG disintegrating tablet Take 5 mg by mouth as needed for migraine. May repeat in 2 hours if needed     triamterene-hydrochlorothiazide (MAXZIDE) 75-50 MG tablet Take 1 tablet by mouth daily.     valACYclovir (VALTREX) 1000 MG tablet TAKE 1/2 TABLET (500 MG TOTAL) BY MOUTH DAILY. 45 tablet 7   No current facility-administered medications on file prior to visit.    Review of Systems:  As per HPI- otherwise negative.   Physical Examination: There were no vitals filed for this visit. There were no vitals filed for this visit. There is no height or weight on file to calculate BMI. Ideal Body Weight:    GEN: no acute distress. HEENT: Atraumatic, Normocephalic.  Ears and Nose: No external deformity. CV: RRR, No M/G/R. No JVD. No thrill. No extra heart sounds. PULM: CTA B, no wheezes, crackles, rhonchi. No retractions. No resp. distress. No accessory muscle use. ABD: S, NT, ND, +BS. No rebound. No HSM. EXTR: No c/c/e PSYCH: Normally interactive. Conversant.    Assessment and Plan: *** Physical  exam- encouraged healthy diet and exercise routine   Signed Abbe Amsterdam, MD

## 2023-06-17 ENCOUNTER — Encounter: Payer: BC Managed Care – PPO | Admitting: Family Medicine

## 2023-06-26 ENCOUNTER — Telehealth: Payer: BC Managed Care – PPO | Admitting: Nurse Practitioner

## 2023-06-26 DIAGNOSIS — B359 Dermatophytosis, unspecified: Secondary | ICD-10-CM

## 2023-06-26 DIAGNOSIS — L309 Dermatitis, unspecified: Secondary | ICD-10-CM

## 2023-06-26 MED ORDER — CLOTRIMAZOLE-BETAMETHASONE 1-0.05 % EX CREA: 1.0000 | TOPICAL_CREAM | Freq: Every day | CUTANEOUS | 0 refills | Status: DC

## 2023-06-26 MED ORDER — TERBINAFINE HCL 1 % EX CREA: 1.0000 | TOPICAL_CREAM | Freq: Two times a day (BID) | CUTANEOUS | 0 refills | Status: DC

## 2023-06-26 NOTE — Addendum Note (Signed)
Addended by: Harlow Mares on: 06/26/2023 07:25 PM   Modules accepted: Orders

## 2023-06-26 NOTE — Progress Notes (Signed)
E Visit for Rash  We are sorry that you are not feeling well. Here is how we plan to help!    Based upon your presentation it appears you have a fungal infection.  I have prescribed:  Meds ordered this encounter  Medications   terbinafine (LAMISIL) 1 % cream    Sig: Apply 1 Application topically 2 (two) times daily.    Dispense:  30 g    Refill:  0    HOME CARE:  Take cool showers and avoid direct sunlight. Keep skin dry Avoid soaps with dye or perfumes  Continue to use cream until symptoms resolve    GET HELP RIGHT AWAY IF:  Symptoms don't go away after treatment. Severe itching that persists. If you rash spreads or swells. If you rash begins to smell. If it blisters and opens or develops a yellow-brown crust. You develop a fever. You have a sore throat. You become short of breath.  MAKE SURE YOU:  Understand these instructions. Will watch your condition. Will get help right away if you are not doing well or get worse.  Thank you for choosing an e-visit.  Your e-visit answers were reviewed by a board certified advanced clinical practitioner to complete your personal care plan. Depending upon the condition, your plan could have included both over the counter or prescription medications.  Please review your pharmacy choice. Make sure the pharmacy is open so you can pick up prescription now. If there is a problem, you may contact your provider through [employer] and have the prescription routed to another pharmacy.  Your safety is important to [place]. If you have drug allergies check your prescription carefully.   For the next 24 hours you can use MyChart to ask questions about today's visit, request a non-urgent call back, or ask for a work or school excuse. You will get an email in the next two days asking about your experience. I hope that your e-visit has been valuable and will speed your recovery.   I spent approximately 5 minutes reviewing the patient's  history, current symptoms and coordinating their care today.

## 2023-07-05 DIAGNOSIS — F331 Major depressive disorder, recurrent, moderate: Secondary | ICD-10-CM | POA: Diagnosis not present

## 2023-07-05 DIAGNOSIS — F411 Generalized anxiety disorder: Secondary | ICD-10-CM | POA: Diagnosis not present

## 2023-07-05 DIAGNOSIS — F4310 Post-traumatic stress disorder, unspecified: Secondary | ICD-10-CM | POA: Diagnosis not present

## 2023-09-02 DIAGNOSIS — F411 Generalized anxiety disorder: Secondary | ICD-10-CM | POA: Diagnosis not present

## 2023-09-02 DIAGNOSIS — F331 Major depressive disorder, recurrent, moderate: Secondary | ICD-10-CM | POA: Diagnosis not present

## 2023-09-02 DIAGNOSIS — F4310 Post-traumatic stress disorder, unspecified: Secondary | ICD-10-CM | POA: Diagnosis not present

## 2023-09-27 DIAGNOSIS — F331 Major depressive disorder, recurrent, moderate: Secondary | ICD-10-CM | POA: Diagnosis not present

## 2023-09-27 DIAGNOSIS — F411 Generalized anxiety disorder: Secondary | ICD-10-CM | POA: Diagnosis not present

## 2023-09-27 DIAGNOSIS — F4310 Post-traumatic stress disorder, unspecified: Secondary | ICD-10-CM | POA: Diagnosis not present

## 2023-10-28 DIAGNOSIS — F331 Major depressive disorder, recurrent, moderate: Secondary | ICD-10-CM | POA: Diagnosis not present

## 2023-10-28 DIAGNOSIS — F411 Generalized anxiety disorder: Secondary | ICD-10-CM | POA: Diagnosis not present

## 2023-10-28 DIAGNOSIS — F4310 Post-traumatic stress disorder, unspecified: Secondary | ICD-10-CM | POA: Diagnosis not present

## 2023-11-05 DIAGNOSIS — L219 Seborrheic dermatitis, unspecified: Secondary | ICD-10-CM | POA: Diagnosis not present

## 2023-12-18 DIAGNOSIS — F4312 Post-traumatic stress disorder, chronic: Secondary | ICD-10-CM | POA: Diagnosis not present

## 2024-01-01 DIAGNOSIS — F4312 Post-traumatic stress disorder, chronic: Secondary | ICD-10-CM | POA: Diagnosis not present

## 2024-01-17 ENCOUNTER — Encounter: Payer: Self-pay | Admitting: Family Medicine

## 2024-01-17 ENCOUNTER — Ambulatory Visit: Admitting: Family Medicine

## 2024-01-17 VITALS — BP 117/58 | HR 63 | Ht 70.0 in | Wt 197.0 lb

## 2024-01-17 DIAGNOSIS — E782 Mixed hyperlipidemia: Secondary | ICD-10-CM | POA: Diagnosis not present

## 2024-01-17 DIAGNOSIS — F419 Anxiety disorder, unspecified: Secondary | ICD-10-CM | POA: Diagnosis not present

## 2024-01-17 DIAGNOSIS — B028 Zoster with other complications: Secondary | ICD-10-CM | POA: Diagnosis not present

## 2024-01-17 DIAGNOSIS — E663 Overweight: Secondary | ICD-10-CM

## 2024-01-17 DIAGNOSIS — Z0000 Encounter for general adult medical examination without abnormal findings: Secondary | ICD-10-CM

## 2024-01-17 DIAGNOSIS — Z6828 Body mass index (BMI) 28.0-28.9, adult: Secondary | ICD-10-CM

## 2024-01-17 LAB — COMPREHENSIVE METABOLIC PANEL WITH GFR
ALT: 14 U/L (ref 0–35)
AST: 17 U/L (ref 0–37)
Albumin: 4.7 g/dL (ref 3.5–5.2)
Alkaline Phosphatase: 53 U/L (ref 39–117)
BUN: 10 mg/dL (ref 6–23)
CO2: 27 meq/L (ref 19–32)
Calcium: 9.2 mg/dL (ref 8.4–10.5)
Chloride: 105 meq/L (ref 96–112)
Creatinine, Ser: 0.76 mg/dL (ref 0.40–1.20)
GFR: 100.31 mL/min (ref >60.00)
Glucose, Bld: 94 mg/dL (ref 70–99)
Potassium: 4 meq/L (ref 3.5–5.1)
Sodium: 141 meq/L (ref 135–145)
Total Bilirubin: 0.8 mg/dL (ref 0.2–1.2)
Total Protein: 7.1 g/dL (ref 6.0–8.3)

## 2024-01-17 LAB — CBC WITH DIFFERENTIAL/PLATELET
Basophils Absolute: 0 K/uL (ref 0.0–0.1)
Basophils Relative: 0.5 % (ref 0.0–3.0)
Eosinophils Absolute: 0 K/uL (ref 0.0–0.7)
Eosinophils Relative: 0.7 % (ref 0.0–5.0)
HCT: 40.7 % (ref 36.0–46.0)
Hemoglobin: 13.6 g/dL (ref 12.0–15.0)
Lymphocytes Relative: 39.2 % (ref 12.0–46.0)
Lymphs Abs: 1.8 K/uL (ref 0.7–4.0)
MCHC: 33.4 g/dL (ref 30.0–36.0)
MCV: 92.1 fl (ref 78.0–100.0)
Monocytes Absolute: 0.3 K/uL (ref 0.1–1.0)
Monocytes Relative: 6.2 % (ref 3.0–12.0)
Neutro Abs: 2.5 K/uL (ref 1.4–7.7)
Neutrophils Relative %: 53.4 % (ref 43.0–77.0)
Platelets: 286 K/uL (ref 150.0–400.0)
RBC: 4.42 Mil/uL (ref 3.87–5.11)
RDW: 14.1 % (ref 11.5–15.5)
WBC: 4.6 K/uL (ref 4.0–10.5)

## 2024-01-17 LAB — LIPID PANEL
Cholesterol: 182 mg/dL (ref 0–200)
HDL: 62.7 mg/dL (ref >39.00)
LDL Cholesterol: 101 mg/dL — ABNORMAL HIGH (ref 0–99)
NonHDL: 119.01
Total CHOL/HDL Ratio: 3
Triglycerides: 90 mg/dL (ref 0.0–149.0)
VLDL: 18 mg/dL (ref 0.0–40.0)

## 2024-01-17 LAB — TSH: TSH: 1.48 u[IU]/mL (ref 0.35–5.50)

## 2024-01-17 NOTE — Patient Instructions (Signed)
 Thank you for choosing [facility] at [facility] for your Primary Care needs. I am excited for the opportunity to partner with you to meet your health care goals. It was a pleasure meeting you today!  Information on diet, exercise, and health maintenance recommendations are listed below. This is information to help you be sure you are on track for optimal health and monitoring.   Please look over this and let us know if you have any questions or if you have completed any of the health maintenance outside of [facility] so that we can be sure your records are up to date.  ___________________________________________________________  MyChart:  For all urgent or time sensitive needs we ask that you please call the office to avoid delays. Our number is [phone]. MyChart is not constantly monitored and due to the large volume of messages a day, replies may take up to 72 business hours.  MyChart Policy: MyChart allows for you to see your visit notes, after visit summary, provider recommendations, lab and tests results, make an appointment, request refills, and contact your provider or the office for non-urgent questions or concerns. Providers are seeing patients during normal business hours and do not have built in time to review MyChart messages.  We ask that you allow a minimum of 3 business days for responses to [employer]. For this reason, please do not send urgent requests through MyChart. Please call the office at [phone]. New and ongoing conditions may require a visit. We have virtual and in-person visits available for your convenience.  Complex MyChart concerns may require a visit. Your provider may request you schedule a virtual or in-person visit to ensure we are providing the best care possible. MyChart messages sent after 11:00 AM on Friday may not be received by the provider until Monday morning.    Lab and Test Results: You will receive your lab and test  results on MyChart as soon as they are completed and results have been sent by the lab or testing facility. Due to this service, you will receive your results BEFORE your provider.  I review lab and test results each morning prior to seeing patients. Some results require collaboration with other providers to ensure you are receiving the most appropriate care. For this reason, we ask that you please allow a minimum of 3-5 business days from the time that ALL results have been received for your provider to receive and review lab and test results and contact you about these.  Most lab and test result comments from the provider will be sent through MyChart. Your provider may recommend changes to the plan of care, follow-up visits, repeat testing, ask questions, or request an office visit to discuss these results. You may reply directly to this message or call the office to provide information for the provider or set up an appointment. In some instances, you will be called with test results and recommendations. Please let us know if this is preferred and we will make note of this in your chart to provide this for you.    If you have not heard a response to your lab or test results in 5 business days from all results returning to MyChart, please call the office to let us know. We ask that you please avoid calling prior to this time unless there is an emergent concern. Due to high call volumes, this can delay the resulting process.  After Hours: For all non-emergency after hours needs, please  call the office at [phone] and select the option to reach the on-call  service. On-call services are shared between multiple [place] offices and therefore it will not be possible to speak directly with your provider. On-call providers may provide medical advice and recommendations, but are unable to provide refills for maintenance medications.  For all emergency or urgent medical needs after normal business hours, we  recommend that you seek care at the closest Urgent Care or Emergency Department to ensure appropriate treatment in a timely manner.  [facility] has a 24 hour emergency room located on the ground floor for your convenience.   Urgent Concerns During the Business Day Providers are seeing patients from 8AM to 5PM with a busy schedule and are most often not able to respond to non-urgent calls until the end of the day or the next business day. If you should have URGENT concerns during the day, please call and speak to the nurse or schedule a same day appointment so that we can address your concern without delay.   Thank you, again, for choosing me as your health care partner. I appreciate your trust and look forward to learning more about you!   Olivia Marrow Reola Calkins, DNP, FNP-C  ___________________________________________________________  Health Maintenance Recommendations Screening Testing Mammogram Every 1-2 years based on history and risk factors Starting at age 60 Pap Smear Ages 21-39 every 3 years Ages 22-65 every 5 years with HPV testing More frequent testing may be required based on results and history Colon Cancer Screening Every 1-10 years based on test performed, risk factors, and history Starting at age 29 Bone Density Screening Every 2-10 years based on history Starting at age 79 for women Recommendations for men differ based on medication usage, history, and risk factors AAA Screening One time ultrasound Men 59-28 years old who have ever smoked Lung Cancer Screening Low Dose Lung CT every 12 months Age 51-80 years with a 20 pack-year smoking history who still smoke or who have quit within the last 15 years  Screening Labs Routine  Labs: Complete Blood Count (CBC), Complete Metabolic Panel (CMP), Cholesterol (Lipid Panel) Every 6-12 months based on history and medications May be recommended more frequently based on current conditions or previous results Hemoglobin  A1c Lab Every 3-12 months based on history and previous results Starting at age 79 or earlier with diagnosis of diabetes, high cholesterol, BMI >26, and/or risk factors Frequent monitoring for patients with diabetes to ensure blood sugar control Thyroid Panel  Every 6 months based on history, symptoms, and risk factors May be repeated more often if on medication HIV One time testing for all patients 66 and older May be repeated more frequently for patients with increased risk factors or exposure Hepatitis C One time testing for all patients 59 and older May be repeated more frequently for patients with increased risk factors or exposure Gonorrhea, Chlamydia Every 12 months for all sexually active persons 13-24 years Additional monitoring may be recommended for those who are considered high risk or who have symptoms PSA Men 69-17 years old with risk factors Additional screening may be recommended from age 43-69 based on risk factors, symptoms, and history  Vaccine Recommendations Tetanus Booster All adults every 10 years Flu Vaccine All patients 6 months and older every year COVID Vaccine All patients 12 years and older Initial dosing with booster May recommend additional booster based on age and health history HPV Vaccine 2 doses all patients age 71-26 Dosing may be considered  for patients over 26 Shingles Vaccine (Shingrix) 2 doses all adults 50 years and older Pneumonia (Pneumovax 23) All adults 65 years and older May recommend earlier dosing based on health history Pneumonia (Prevnar 53) All adults 65 years and older Dosed 1 year after Pneumovax 23 Pneumonia (Prevnar 20) All adults 65 years and older (adults 19-64 with certain conditions or risk factors) 1 dose  For those who have not received Prevnar 13 vaccine previously   Additional Screening, Testing, and Vaccinations may be recommended on an individualized basis based on family history, health history, risk  factors, and/or exposure.  __________________________________________________________  Diet Recommendations for All Patients  I recommend that all patients maintain a diet low in saturated fats, carbohydrates, and cholesterol. While this can be challenging at first, it is not impossible and small changes can make big differences.  Things to try: Decreasing the amount of soda, sweet tea, and/or juice to one or less per day and replace with water While water is always the first choice, if you do not like water you may consider adding a water additive without sugar to improve the taste other sugar free drinks Replace potatoes with a brightly colored vegetable  Use healthy oils, such as canola oil or olive oil, instead of butter or hard margarine Limit your bread intake to two pieces or less a day Replace regular pasta with low carb pasta options Bake, broil, or grill foods instead of frying Monitor portion sizes  Eat smaller, more frequent meals throughout the day instead of large meals  An important thing to remember is, if you love foods that are not great for your health, you don't have to give them up completely. Instead, allow these foods to be a reward when you have done well. Allowing yourself to still have special treats every once in a while is a nice way to tell yourself thank you for working hard to keep yourself healthy.   Also remember that every day is a new day. If you have a bad day and "fall off the wagon", you can still climb right back up and keep moving along on your journey!  We have resources available to help you!  Some websites that may be helpful include: www.http://www.wall-moore.info/  Www.VeryWell[employer].com _____________________________________________________________  Activity Recommendations for All Patients  I recommend that all adults get at least 30 minutes of moderate physical activity that elevates your heart rate at least 5 days out of the week.  Some examples  include: Walking or jogging at a pace that allows you to carry on a conversation Cycling (stationary bike or outdoors) Water aerobics Yoga Weight lifting Dancing If physical limitations prevent you from putting stress on your joints, exercise in a pool or seated in a chair are excellent options.  Do determine your MAXIMUM heart rate for activity: 220 - YOUR AGE = MAX Heart Rate   Remember! Do not push yourself too hard.  Start slowly and build up your pace, speed, weight, time in exercise, etc.  Allow your body to rest between exercise and get good sleep. You will need more water than normal when you are exerting yourself. Do not wait until you are thirsty to drink. Drink with a purpose of getting in at least 8, 8 ounce glasses of water a day plus more depending on how much you exercise and sweat.    If you begin to develop dizziness, chest pain, abdominal pain, jaw pain, shortness of breath, headache, vision changes, lightheadedness, or other concerning symptoms,  stop the activity and allow your body to rest. If your symptoms are severe, seek emergency evaluation immediately. If your symptoms are concerning, but not severe, please let us know so that we can recommend further evaluation.

## 2024-01-17 NOTE — Progress Notes (Addendum)
 New Patient Office Visit  Subjective    Patient ID: Olivia Reyes, female    DOB: August 05, 1985  Age: 38 y.o. MRN: [id number]  CC:  Chief Complaint  Patient presents with   Establish Care    HPI Olivia Reyes presents to establish care    Discussed the use of AI scribe software for clinical note transcription with the patient, who gave verbal consent to proceed.  History of Present Illness Olivia Reyes is a 38 year old female who presents for reestablishment of care and management of her medical conditions.  She has a history of recurrent shingles outbreaks, primarily affecting a small patch on the back of her left leg. She has experienced four outbreaks in the past, with each episode lasting about a day before the blisters start to dry up. She was referred to infectious disease specialists, but was unable to get many answers; she was told it was apparently a rare strain. She is prescribed Valtrex , which she takes at the onset of symptoms, typically when she experiences pain in the area, to prevent blister formation. She has not had a shingles outbreak for some time and has been unable to receive the shingles vaccine due to insurance coverage issues.  She underwent a total hysterectomy in 2020 following an ablation procedure due to prolapse issues, retaining her ovaries. Her surgical history also includes gallbladder removal, hand surgery, and wisdom teeth extraction.  She experiences chronic constipation, for which she takes Linzess  prescribed by her GI doctor, resulting in daily bowel movements, though often loose, which she prefers over constipation. She has a history of abnormal polyps and diverticulitis, requiring two colonoscopies, with the next one due in 2029.  She has a history of anxiety, depression, and ADHD, managed by a psychiatric nurse practitioner. Her current medications include Wellbutrin  150 mg, Klonopin  0.25-0.5 mg twice daily as needed, methylphenidate  20 mg, and  Effexor 37.5 mg.  She reports a history of migraines, with an upcoming neurology follow-up appointment in October.  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 She manages this with turmeric and anti-inflammatory medications. No injections or other interventions for her knees. Reports going to the gym more regularly has been helpful.  She has been actively trying to lose weight, attending the gym five to six days a week and modifying her diet to avoid GI discomfort. Her weight has decreased from 238 lbs in 2019 to 197 lbs currently.      Outpatient Encounter Medications as of 01/17/2024  Medication Sig   buPROPion  (WELLBUTRIN  XL) 150 MG 24 hr tablet Take 1 tablet (150 mg total) by mouth every morning. For depression   clonazePAM  (KLONOPIN ) 0.5 MG tablet Take 0.5-1 tablets (0.25-0.5 mg total) by mouth 2 (two) times daily as needed for anxiety/panic attacks   fluconazole  (DIFLUCAN ) 200 MG tablet Take 200 mg by mouth once a week.   linaclotide  (LINZESS ) 72 MCG capsule Take 1 capsule (72 mcg total) by mouth daily before breakfast.   methylphenidate  (METADATE  CD) 20 MG CR capsule Take 1 capsule(s) by mouth every morning for ADHD   rizatriptan (MAXALT) 10 MG tablet Take 10 mg by mouth as needed for migraine. May repeat in 2 hours if needed   valACYclovir  (VALTREX ) 1000 MG tablet TAKE 1/2 TABLET (500 MG TOTAL) BY MOUTH DAILY.   venlafaxine (EFFEXOR) 37.5 MG tablet Take 37.5 mg by mouth 2 (two) times daily.   [  DISCONTINUED] buPROPion  (WELLBUTRIN  XL) 150 MG 24 hr tablet Take 150 mg by mouth daily.   [DISCONTINUED] clotrimazole -betamethasone  (LOTRISONE ) cream Apply 1 Application topically daily.   [DISCONTINUED] fluconazole  (DIFLUCAN ) 150 MG tablet Take 1 tablet (150 mg total) by mouth as directed. Repeat in 1 week if needed   [DISCONTINUED] rizatriptan (MAXALT-MLT) 5 MG disintegrating tablet Take 5 mg by mouth as needed for migraine.  May repeat in 2 hours if needed   No facility-administered encounter medications on file as of 01/17/2024.    Past Medical History:  Diagnosis Date   Abnormal Pap smear of cervix 07/27/2013   Recent diagnosis from OB/GYN.  Patient followed by Dr. Genette.  Needs repeat PAP smear 1 year.     Anxiety    Chicken pox    Complication of anesthesia    Prolonged sedation from anesthesia   Depression    Family history of adverse reaction to anesthesia    family has prolonged sedation issues   HSV infection    Left ovarian cyst 03/2013   3.8 cm minimally complex left ovarian cyst   moderate 07/01/2018   right low paraspinal, right upper abdomen   Obesity    Pelvic adhesions    Possible   Pelvic pain    Shingles 2019   Strep throat    Tonsil stone 2019    Past Surgical History:  Procedure Laterality Date   ABDOMINAL HYSTERECTOMY  11/2018   Hysterectomy, ovaries still remaining   CHOLECYSTECTOMY  2013   COLONOSCOPY     DILITATION & CURRETTAGE/HYSTROSCOPY WITH HYDROTHERMAL ABLATION N/A 10/31/2018   Procedure: DILATATION & CURETTAGE/HYSTEROSCOPY WITH attempted HYDROTHERMAL ABLATION;  Surgeon: Mat Browning, MD;  Location: [facility];  Service: Gynecology;  Laterality: N/A;   HAND SURGERY Left 2019   HYSTERECTOMY ABDOMINAL WITH SALPINGO-OOPHORECTOMY Bilateral 11/27/2018   Procedure: HYSTERECTOMY ABDOMINAL WITH BILATERAL SALPINGECTOMY;  Surgeon: Mat Browning, MD;  Location: [facility] OR;  Service: Gynecology;  Laterality: Bilateral;   LAPAROSCOPIC TUBAL LIGATION Bilateral 10/31/2018   Procedure: attemted LAPAROSCOPIC TUBAL LIGATION;  Surgeon: Mat Browning, MD;  Location: [facility] [place];  Service: Gynecology;  Laterality: Bilateral;   WISDOM TOOTH EXTRACTION     WOUND EXPLORATION Left 09/12/2017   Procedure: LEFT HAND EXPLORATION;  Surgeon: Murrell Drivers, MD;  Location: [facility];  Service: Orthopedics;  Laterality: Left;    Family  History  Problem Relation Age of Onset   Hypertension Mother    Varicose Veins Mother    Varicose Veins Sister    Arthritis Maternal Grandmother    Hyperlipidemia Maternal Grandmother    Hypertension Maternal Grandmother    Alzheimer's disease Maternal Grandmother    Throat cancer Maternal Grandfather    Cancer Maternal Grandfather    Healthy Daughter        x1   Stroke Maternal Aunt    Thyroid  disease Maternal Aunt    Colon cancer Neg Hx    Rectal cancer Neg Hx    Colon polyps Neg Hx    Pancreatic cancer Neg Hx    Stomach cancer Neg Hx     Social History   Socioeconomic History   Marital status: Married    Spouse name: Not on file   Number of children: Not on file   Years of education: Not on file   Highest education level: Not on file  Occupational History   Not on file  Tobacco Use   Smoking status: Former    Current packs/day: 0.00  Types: Cigarettes    Quit date: 07/27/2012    Years since quitting: 11.5   Smokeless tobacco: Never  Vaping Use   Vaping status: Former   Quit date: 05/11/2020  Substance and Sexual Activity   Alcohol use: Yes    Alcohol/week: 1.0 standard drink of alcohol    Types: 1 Standard drinks or equivalent per week    Comment: rarely   Drug use: No   Sexual activity: Yes    Birth control/protection: None    Comment: hysterectomy  Other Topics Concern   Not on file  Social History Narrative   Not on file   Social Drivers of Health   Financial Resource Strain: Not on file  Food Insecurity: Not on file  Transportation Needs: Not on file  Physical Activity: Not on file  Stress: Not on file  Social Connections: Not on file  Intimate Partner Violence: Not on file    ROS All review of systems negative except what is listed in the HPI       Objective    BP (!) 117/58   Pulse 63   Ht 5' 10 (1.778 m)   Wt 197 lb (89.4 kg)   LMP 06/19/2013   SpO2 100%   BMI 28.27 kg/m   Physical Exam Vitals reviewed.  Constitutional:       General: She is not in acute distress.    Appearance: Normal appearance. She is not ill-appearing.  Cardiovascular:     Rate and Rhythm: Normal rate and regular rhythm.     Heart sounds: Normal heart sounds.  Pulmonary:     Effort: Pulmonary effort is normal.     Breath sounds: Normal breath sounds.  Skin:    General: Skin is warm and dry.  Neurological:     Mental Status: She is alert and oriented to person, place, and time.  Psychiatric:        Mood and Affect: Mood normal.        Behavior: Behavior normal.        Thought Content: Thought content normal.        Judgment: Judgment normal.               Assessment & Plan:   Problem List Items Addressed This Visit       Active Problems   Anxiety   No SI/HI. Well controlled on current regimen. Following with psych.            Shingles rash   Hx of recurrent rash responsive to Valtrex . Uses only PRN. No recent issues. Will message me when she needs a refill. Previously evaluated by ID.            Mixed hyperlipidemia - Primary   No current meds Labs today       Relevant Orders   Comprehensive metabolic panel with GFR   Lipid panel   Other Visit Diagnoses       Encounter for medical examination to establish care         BMI 28.0-28.9,adult       Overweight   Relevant Orders   CBC with Differential/Platelet   Comprehensive metabolic panel with GFR   Lipid panel   TSH       Return in about 1 year (around 01/16/2025) for physical.   Waddell KATHEE Mon, NP

## 2024-01-17 NOTE — Assessment & Plan Note (Signed)
 No current meds Labs today

## 2024-01-17 NOTE — Assessment & Plan Note (Signed)
 Hx of recurrent rash responsive to Valtrex . Uses only PRN. No recent issues. Will message me when she needs a refill. Previously evaluated by ID.

## 2024-01-17 NOTE — Assessment & Plan Note (Signed)
 No SI/HI. Well controlled on current regimen. Following with psych.

## 2024-01-20 ENCOUNTER — Ambulatory Visit: Payer: Self-pay | Admitting: Family Medicine

## 2024-01-21 DIAGNOSIS — F4323 Adjustment disorder with mixed anxiety and depressed mood: Secondary | ICD-10-CM | POA: Diagnosis not present

## 2024-01-22 DIAGNOSIS — F331 Major depressive disorder, recurrent, moderate: Secondary | ICD-10-CM | POA: Diagnosis not present

## 2024-01-22 DIAGNOSIS — F4310 Post-traumatic stress disorder, unspecified: Secondary | ICD-10-CM | POA: Diagnosis not present

## 2024-01-22 DIAGNOSIS — F411 Generalized anxiety disorder: Secondary | ICD-10-CM | POA: Diagnosis not present

## 2024-02-05 DIAGNOSIS — F4323 Adjustment disorder with mixed anxiety and depressed mood: Secondary | ICD-10-CM | POA: Diagnosis not present

## 2024-02-13 DIAGNOSIS — F4323 Adjustment disorder with mixed anxiety and depressed mood: Secondary | ICD-10-CM | POA: Diagnosis not present

## 2024-02-17 DIAGNOSIS — D225 Melanocytic nevi of trunk: Secondary | ICD-10-CM | POA: Diagnosis not present

## 2024-02-17 DIAGNOSIS — D485 Neoplasm of uncertain behavior of skin: Secondary | ICD-10-CM | POA: Diagnosis not present

## 2024-02-17 DIAGNOSIS — D229 Melanocytic nevi, unspecified: Secondary | ICD-10-CM | POA: Diagnosis not present

## 2024-02-17 DIAGNOSIS — L814 Other melanin hyperpigmentation: Secondary | ICD-10-CM | POA: Diagnosis not present

## 2024-02-17 DIAGNOSIS — D224 Melanocytic nevi of scalp and neck: Secondary | ICD-10-CM | POA: Diagnosis not present

## 2024-02-17 DIAGNOSIS — L578 Other skin changes due to chronic exposure to nonionizing radiation: Secondary | ICD-10-CM | POA: Diagnosis not present

## 2024-02-19 DIAGNOSIS — F4323 Adjustment disorder with mixed anxiety and depressed mood: Secondary | ICD-10-CM | POA: Diagnosis not present

## 2024-02-26 DIAGNOSIS — D235 Other benign neoplasm of skin of trunk: Secondary | ICD-10-CM | POA: Diagnosis not present

## 2024-02-26 DIAGNOSIS — F900 Attention-deficit hyperactivity disorder, predominantly inattentive type: Secondary | ICD-10-CM | POA: Diagnosis not present

## 2024-02-26 DIAGNOSIS — D229 Melanocytic nevi, unspecified: Secondary | ICD-10-CM | POA: Diagnosis not present

## 2024-02-26 DIAGNOSIS — F411 Generalized anxiety disorder: Secondary | ICD-10-CM | POA: Diagnosis not present

## 2024-02-26 DIAGNOSIS — T7849XA Other allergy, initial encounter: Secondary | ICD-10-CM | POA: Diagnosis not present

## 2024-02-26 DIAGNOSIS — L918 Other hypertrophic disorders of the skin: Secondary | ICD-10-CM | POA: Diagnosis not present

## 2024-02-26 DIAGNOSIS — F4310 Post-traumatic stress disorder, unspecified: Secondary | ICD-10-CM | POA: Diagnosis not present

## 2024-02-26 DIAGNOSIS — D239 Other benign neoplasm of skin, unspecified: Secondary | ICD-10-CM | POA: Diagnosis not present

## 2024-03-03 DIAGNOSIS — N760 Acute vaginitis: Secondary | ICD-10-CM | POA: Diagnosis not present

## 2024-03-03 DIAGNOSIS — Z1151 Encounter for screening for human papillomavirus (HPV): Secondary | ICD-10-CM | POA: Diagnosis not present

## 2024-03-03 DIAGNOSIS — Z6827 Body mass index (BMI) 27.0-27.9, adult: Secondary | ICD-10-CM | POA: Diagnosis not present

## 2024-03-03 DIAGNOSIS — Z113 Encounter for screening for infections with a predominantly sexual mode of transmission: Secondary | ICD-10-CM | POA: Diagnosis not present

## 2024-03-03 DIAGNOSIS — Z01419 Encounter for gynecological examination (general) (routine) without abnormal findings: Secondary | ICD-10-CM | POA: Diagnosis not present

## 2024-03-05 DIAGNOSIS — F4323 Adjustment disorder with mixed anxiety and depressed mood: Secondary | ICD-10-CM | POA: Diagnosis not present

## 2024-03-12 DIAGNOSIS — F4323 Adjustment disorder with mixed anxiety and depressed mood: Secondary | ICD-10-CM | POA: Diagnosis not present

## 2024-03-19 DIAGNOSIS — F4323 Adjustment disorder with mixed anxiety and depressed mood: Secondary | ICD-10-CM | POA: Diagnosis not present

## 2024-03-23 DIAGNOSIS — Z8379 Family history of other diseases of the digestive system: Secondary | ICD-10-CM | POA: Diagnosis not present

## 2024-04-02 DIAGNOSIS — F4323 Adjustment disorder with mixed anxiety and depressed mood: Secondary | ICD-10-CM | POA: Diagnosis not present

## 2024-04-06 DIAGNOSIS — F331 Major depressive disorder, recurrent, moderate: Secondary | ICD-10-CM | POA: Diagnosis not present

## 2024-04-06 DIAGNOSIS — F4310 Post-traumatic stress disorder, unspecified: Secondary | ICD-10-CM | POA: Diagnosis not present

## 2024-04-06 DIAGNOSIS — F411 Generalized anxiety disorder: Secondary | ICD-10-CM | POA: Diagnosis not present

## 2024-04-09 DIAGNOSIS — F4323 Adjustment disorder with mixed anxiety and depressed mood: Secondary | ICD-10-CM | POA: Diagnosis not present

## 2024-04-16 DIAGNOSIS — F4323 Adjustment disorder with mixed anxiety and depressed mood: Secondary | ICD-10-CM | POA: Diagnosis not present

## 2024-04-21 ENCOUNTER — Other Ambulatory Visit: Payer: Self-pay | Admitting: Gastroenterology

## 2024-04-30 DIAGNOSIS — F4323 Adjustment disorder with mixed anxiety and depressed mood: Secondary | ICD-10-CM | POA: Diagnosis not present

## 2024-05-07 DIAGNOSIS — F4323 Adjustment disorder with mixed anxiety and depressed mood: Secondary | ICD-10-CM | POA: Diagnosis not present

## 2024-05-14 DIAGNOSIS — F4323 Adjustment disorder with mixed anxiety and depressed mood: Secondary | ICD-10-CM | POA: Diagnosis not present

## 2024-05-17 ENCOUNTER — Telehealth: Admitting: Physician Assistant

## 2024-05-17 DIAGNOSIS — B9689 Other specified bacterial agents as the cause of diseases classified elsewhere: Secondary | ICD-10-CM

## 2024-05-17 DIAGNOSIS — N760 Acute vaginitis: Secondary | ICD-10-CM | POA: Diagnosis not present

## 2024-05-17 MED ORDER — METRONIDAZOLE 500 MG PO TABS: 500.0000 mg | ORAL_TABLET | Freq: Two times a day (BID) | ORAL | 0 refills | Status: AC

## 2024-05-17 NOTE — Progress Notes (Signed)

## 2024-05-19 ENCOUNTER — Other Ambulatory Visit: Payer: Self-pay | Admitting: Gastroenterology

## 2024-05-20 ENCOUNTER — Other Ambulatory Visit: Payer: Self-pay | Admitting: Gastroenterology

## 2024-05-27 DIAGNOSIS — M25561 Pain in right knee: Secondary | ICD-10-CM | POA: Diagnosis not present

## 2024-05-27 DIAGNOSIS — M222X2 Patellofemoral disorders, left knee: Secondary | ICD-10-CM | POA: Diagnosis not present

## 2024-05-27 DIAGNOSIS — M25562 Pain in left knee: Secondary | ICD-10-CM | POA: Diagnosis not present
